# Patient Record
Sex: Male | Born: 1960 | Race: White | Hispanic: No | Marital: Married | Smoking: Former smoker
Health system: Southern US, Community
[De-identification: ages and names within clinical notes are randomized; demographics above are authoritative.]

## PROBLEM LIST (undated history)

## (undated) DIAGNOSIS — E785 Hyperlipidemia, unspecified: Secondary | ICD-10-CM

## (undated) DIAGNOSIS — Z9889 Other specified postprocedural states: Secondary | ICD-10-CM

## (undated) DIAGNOSIS — F32A Depression, unspecified: Secondary | ICD-10-CM

## (undated) DIAGNOSIS — I255 Ischemic cardiomyopathy: Secondary | ICD-10-CM

## (undated) DIAGNOSIS — Z720 Tobacco use: Secondary | ICD-10-CM

## (undated) DIAGNOSIS — R519 Headache, unspecified: Secondary | ICD-10-CM

## (undated) DIAGNOSIS — G8929 Other chronic pain: Secondary | ICD-10-CM

## (undated) DIAGNOSIS — R112 Nausea with vomiting, unspecified: Secondary | ICD-10-CM

## (undated) DIAGNOSIS — K219 Gastro-esophageal reflux disease without esophagitis: Secondary | ICD-10-CM

## (undated) DIAGNOSIS — C61 Malignant neoplasm of prostate: Secondary | ICD-10-CM

## (undated) DIAGNOSIS — R51 Headache: Secondary | ICD-10-CM

## (undated) DIAGNOSIS — F329 Major depressive disorder, single episode, unspecified: Secondary | ICD-10-CM

## (undated) DIAGNOSIS — I2510 Atherosclerotic heart disease of native coronary artery without angina pectoris: Secondary | ICD-10-CM

## (undated) DIAGNOSIS — G4730 Sleep apnea, unspecified: Secondary | ICD-10-CM

## (undated) DIAGNOSIS — I219 Acute myocardial infarction, unspecified: Secondary | ICD-10-CM

## (undated) HISTORY — DX: Headache, unspecified: R51.9

## (undated) HISTORY — DX: Malignant neoplasm of prostate: C61

## (undated) HISTORY — DX: Headache: R51

## (undated) HISTORY — DX: Major depressive disorder, single episode, unspecified: F32.9

## (undated) HISTORY — DX: Depression, unspecified: F32.A

## (undated) HISTORY — PX: VASECTOMY: SHX75

## (undated) HISTORY — DX: Hyperlipidemia, unspecified: E78.5

## (undated) HISTORY — DX: Sleep apnea, unspecified: G47.30

## (undated) HISTORY — PX: TONSILLECTOMY: SUR1361

## (undated) HISTORY — DX: Other chronic pain: G89.29

## (undated) HISTORY — DX: Acute myocardial infarction, unspecified: I21.9

---

## 2008-10-18 ENCOUNTER — Ambulatory Visit: Payer: Self-pay | Admitting: Internal Medicine

## 2008-10-18 DIAGNOSIS — F3289 Other specified depressive episodes: Secondary | ICD-10-CM | POA: Insufficient documentation

## 2008-10-18 DIAGNOSIS — E109 Type 1 diabetes mellitus without complications: Secondary | ICD-10-CM | POA: Insufficient documentation

## 2008-10-18 DIAGNOSIS — F329 Major depressive disorder, single episode, unspecified: Secondary | ICD-10-CM

## 2008-10-23 ENCOUNTER — Encounter (INDEPENDENT_AMBULATORY_CARE_PROVIDER_SITE_OTHER): Payer: Self-pay | Admitting: *Deleted

## 2008-10-25 LAB — CONVERTED CEMR LAB
AST: 13 units/L (ref 0–37)
BUN: 7 mg/dL (ref 6–23)
CO2: 21 meq/L (ref 19–32)
Glucose, Bld: 67 mg/dL — ABNORMAL LOW (ref 70–99)
Potassium: 3.6 meq/L (ref 3.5–5.3)

## 2008-11-01 ENCOUNTER — Telehealth (INDEPENDENT_AMBULATORY_CARE_PROVIDER_SITE_OTHER): Payer: Self-pay | Admitting: *Deleted

## 2008-11-05 ENCOUNTER — Telehealth (INDEPENDENT_AMBULATORY_CARE_PROVIDER_SITE_OTHER): Payer: Self-pay | Admitting: *Deleted

## 2008-11-08 ENCOUNTER — Encounter: Payer: Self-pay | Admitting: Internal Medicine

## 2008-11-15 ENCOUNTER — Encounter: Payer: Self-pay | Admitting: Internal Medicine

## 2008-11-15 LAB — HM DIABETES EYE EXAM

## 2009-01-24 ENCOUNTER — Ambulatory Visit: Payer: Self-pay | Admitting: Internal Medicine

## 2009-01-24 DIAGNOSIS — M25519 Pain in unspecified shoulder: Secondary | ICD-10-CM | POA: Insufficient documentation

## 2009-02-03 ENCOUNTER — Telehealth (INDEPENDENT_AMBULATORY_CARE_PROVIDER_SITE_OTHER): Payer: Self-pay | Admitting: *Deleted

## 2009-06-02 ENCOUNTER — Ambulatory Visit: Payer: Self-pay | Admitting: Family Medicine

## 2009-06-02 DIAGNOSIS — G44009 Cluster headache syndrome, unspecified, not intractable: Secondary | ICD-10-CM | POA: Insufficient documentation

## 2009-06-04 ENCOUNTER — Telehealth (INDEPENDENT_AMBULATORY_CARE_PROVIDER_SITE_OTHER): Payer: Self-pay | Admitting: *Deleted

## 2009-06-05 ENCOUNTER — Telehealth (INDEPENDENT_AMBULATORY_CARE_PROVIDER_SITE_OTHER): Payer: Self-pay | Admitting: *Deleted

## 2009-06-06 ENCOUNTER — Encounter: Payer: Self-pay | Admitting: Internal Medicine

## 2009-06-06 ENCOUNTER — Telehealth (INDEPENDENT_AMBULATORY_CARE_PROVIDER_SITE_OTHER): Payer: Self-pay | Admitting: *Deleted

## 2009-12-26 ENCOUNTER — Ambulatory Visit: Payer: Self-pay | Admitting: Family Medicine

## 2009-12-26 DIAGNOSIS — G4700 Insomnia, unspecified: Secondary | ICD-10-CM | POA: Insufficient documentation

## 2009-12-26 DIAGNOSIS — J019 Acute sinusitis, unspecified: Secondary | ICD-10-CM | POA: Insufficient documentation

## 2010-01-30 ENCOUNTER — Ambulatory Visit: Payer: Self-pay | Admitting: Internal Medicine

## 2010-02-10 ENCOUNTER — Telehealth: Payer: Self-pay | Admitting: Internal Medicine

## 2010-03-10 NOTE — Medication Information (Signed)
Summary: Approval for Sumatriptan Spray/[facility]  Approval for Sumatriptan Spray/[facility]   Imported By: Lanelle Bal 06/13/2009 11:59:52  _____________________________________________________________________  External Attachment:    Type:   Image     Comment:   External Document

## 2010-03-10 NOTE — Progress Notes (Signed)
  Phone Note Outgoing Call Call back at [facility] Phone [phone]   Summary of Call: imitrex tabs denied for cluster headaches.....Marland Kitchen  advised pt that we can try nasal spray, [facility]....prior auth initiated Shary Decamp  June 06, 2009 8:13 AM     New/Updated Medications: IMITREX 20 MG/ACT SOLN (SUMATRIPTAN) 1 spray at onset of headache may repeat q2 hours Prescriptions: IMITREX 20 MG/ACT SOLN (SUMATRIPTAN) 1 spray at onset of headache may repeat q2 hours  #1 x 1   Entered by:   Shary Decamp   Authorized by:   Neena Rhymes MD   Signed by:   Shary Decamp on 06/06/2009   Method used:   [facility] [phone]* (retail)       [address]       [place], [place]  [address]       Ph: [phone]       Fax: [phone]   RxID:   [id number]   Appended Document:  case id # [id number] for nasal spray has been approved for 05/16/09 until 05/17/10, pharmacy aware

## 2010-03-10 NOTE — Progress Notes (Signed)
Summary: prior auth--NOTE FROM PATIENT  Phone Note Call from Patient Call back at [facility] Phone [phone]   Summary of Call: prior auth required on sumatriptan -- called [facility] [phone], case ID # [id number] Nathaniel [name]  June 04, 2009 2:44 PM   Follow-up for Phone Call        PATIENT STOPPED BY TO PICK UP SAMPLES--LEFT PAPERWORK STATING THAT HIS CASE #[id number] WAS DENIED TODAY---PLEASE COPY LETTER---[facility] APPEALS CORRECTION---EXPLAIN SECTION A QUESTION---1) PT SUFFERS FROM [place]---***ANSWER MUST BE YES****  PLEASE FAX NEW [facility] LETTER BACK TO [phone]  WILL TAKE NOTE TO STACIA'S DESK Follow-up by: Jerolyn Shin,  June 05, 2009 4:57 PM  Additional Follow-up for Phone Call Additional follow up Details #1::        see phone note from 4/29, changed to nasal spray Nathaniel [name]  June 06, 2009 11:38 AM

## 2010-03-10 NOTE — Assessment & Plan Note (Signed)
Summary: cluster headache/cbs   Vital Signs:  Patient profile:   50 year old male Weight:      176 pounds Pulse rate:   68 / minute BP sitting:   130 / 80  (left arm)  Vitals Entered By: Doristine Devoid (June 02, 2009 1:50 PM) CC: Cluster HA x1 wk vicodin no help  Comments -has been on Imitrex in the past that help some    History of Present Illness: 50 yo man here today complaining of cluster headaches.  has been taking vicodin w/out relief.  sxs started 1 week ago.  first dx'd 3-4 years ago by previous MD.  only occurs at season changes.  pain is 'excrutiating, unbearable for a few hours', occuring at the same time every night, lasting 2 hrs.  has taken Imitrex in past w/ some relief.  also associated w/ severe nasal congestion.  pressure is always R frontal.  no focal weakness, N/V.  Allergies (verified): No Known Drug Allergies  Review of Systems      See HPI  Physical Exam  General:  alert, well-developed, and well-nourished.   Head:  Normocephalic and atraumatic without obvious abnormalities. No apparent alopecia or balding. Eyes:  PERRL, EOMI, fundi WNL Ears:  External ear exam shows no significant lesions or deformities.  Otoscopic examination reveals clear canals, tympanic membranes are intact bilaterally without bulging, retraction, inflammation or discharge. Hearing is grossly normal bilaterally. Nose:  minimal congestion Lungs:  normal respiratory effort, no intercostal retractions, no accessory muscle use, and normal breath sounds.   Heart:  normal rate, regular rhythm, and no murmur.   Neurologic:  No cranial nerve deficits noted. Station and gait are normal. Plantar reflexes are down-going bilaterally. DTRs are symmetrical throughout. Sensory, motor and coordinative functions appear intact.   Impression & Recommendations:  Problem # 1:  CLUSTER HEADACHE (ICD-339.00) Assessment New pt's sxs occur w/ season changes.  suggested pt discuss a preventative med like  verapamil w/ PCP when he is not in acute event.  offered pt referral to headache wellness center, pt not sure what they would do for him.  script given for imitrex.  no red flags on PE today.  Complete Medication List: 1)  Insulin Pump  2)  Humalog 100 Unit/ml Soln (Insulin lispro (human)) .... Uses in insulin pump 3)  Naprosyn 500 Mg Tabs (Naproxen) .... One twice a day with food p.r.n. pain 4)  Vicodin Es 7.5-750 Mg Tabs (Hydrocodone-acetaminophen) .... One by mouth every 6 hours as needed for pain 5)  Sumatriptan Succinate 50 Mg Tabs (Sumatriptan succinate) .Marland Kitchen.. 1 tab at onset of headache.  may repeat in 2 hours.  disp 1 month supply 6)  Onetouch Ultra Test Strp (Glucose blood) .... Use to test as directed.  disp 1 box  Patient Instructions: 1)  Follow up with Dr Drue Novel as directed 2)  Use the Sumatriptan at the first sign of headache, do not take more than 2 pills/24 hours 3)  Hang in there!! Prescriptions: ONETOUCH ULTRA TEST  STRP (GLUCOSE BLOOD) use to test as directed.  disp 1 box  #1 box x 1   Entered and Authorized by:   Neena Rhymes MD   Signed by:   Neena Rhymes MD on 06/02/2009   Method used:   [facility] to        [facility] [phone]* (retail)       [address]       [place]  [place], [place]  [address]       Ph: [phone]       Fax: [phone]   RxID:   [id number] SUMATRIPTAN SUCCINATE 50 MG TABS (SUMATRIPTAN SUCCINATE) 1 tab at onset of headache.  may repeat in 2 hours.  disp 1 month supply  #1 month x 3   Entered and Authorized by:   Neena Rhymes MD   Signed by:   Neena Rhymes MD on 06/02/2009   Method used:   [facility]ronically to        [facility] [phone]* (retail)       [address]       [place], [place]  [address]       Ph: [phone]       Fax: [phone]   RxID:   [id number]

## 2010-03-10 NOTE — Progress Notes (Signed)
Summary: try different med  Phone Note Call from Patient Call back at Home Phone [phone]   Caller: Patient Summary of Call: patient wanted to know if med was authorized - he said he still has headaches ---could he try treximet since we have samples until we get auth  Initial call taken by: Okey Regal Spring,  June 05, 2009 3:35 PM  Follow-up for Phone Call        I left message on patient's VM informing him per Dr.Tabori its ok for him to get more samples of Treximet while Generic Imitrex is in process for prior auth. We only had two samples which contain 1 pill a peice and I placed it at the front Follow-up by: Shonna Chock,  June 05, 2009 3:49 PM

## 2010-03-10 NOTE — Assessment & Plan Note (Signed)
Summary: SINUS PAIN,CONGESTION/RH.....   Vital Signs:  Patient profile:   50 year old male Height:      69 inches Weight:      182.4 pounds BMI:     27.03 O2 Sat:      99 % on Room air Temp:     98.1 degrees F oral Pulse rate:   92 / minute Pulse rhythm:   regular BP sitting:   112 / 76  (left arm) Cuff size:   large  Vitals Entered By: Almeta Monas CMA Duncan Dull) (December 26, 2009 3:23 PM)  O2 Flow:  Room air CC: x4days c/o cough, congestion, sinus pressure and pain, URI symptoms   History of Present Illness:       This is a 50 year old man who presents with URI symptoms.  The patient complains of nasal congestion, purulent nasal discharge, and earache, but denies clear nasal discharge, sore throat, dry cough, productive cough, and sick contacts.  The patient denies fever, low-grade fever (<100.5 degrees), fever of 100.5-103 degrees, fever of 103.1-104 degrees, fever to >104 degrees, stiff neck, dyspnea, wheezing, rash, vomiting, diarrhea, use of an antipyretic, and response to antipyretic.  The patient also reports headache.  The patient denies itchy watery eyes, itchy throat, sneezing, seasonal symptoms, response to antihistamine, muscle aches, and severe fatigue.  The patient denies the following risk factors for Strep sinusitis: unilateral facial pain, unilateral nasal discharge, poor response to decongestant, double sickening, tooth pain, Strep exposure, tender adenopathy, and absence of cough.    Current Medications (verified): 1)  Insulin Pump 2)  Humalog 100 Unit/ml Soln (Insulin Lispro (Human)) .... Uses in Insulin Pump 3)  Naprosyn 500 Mg Tabs (Naproxen) .... One Twice A Day With Food P.r.n. Pain 4)  Vicodin Es 7.5-750 Mg Tabs (Hydrocodone-Acetaminophen) .... One By Mouth Every 6 Hours As Needed For Pain 5)  Onetouch Ultra Test  Strp (Glucose Blood) .... Use To Test As Directed.  Disp 1 Box 6)  Imitrex 20 Mg/act Soln (Sumatriptan) .Marland Kitchen.. 1 Spray At Onset of Headache May Repeat  Q2 Hours 7)  Klonopin 0.5 Mg Tabs (Clonazepam) .Marland Kitchen.. 1 By Mouth At Bedtime As Needed 8)  Augmentin 875-125 Mg Tabs (Amoxicillin-Pot Clavulanate) .Marland Kitchen.. 1 By Mouth Two Times A Day 9)  Flonase 50 Mcg/act Susp (Fluticasone Propionate) .... 2 Sprays Each Nostril Once Daily  Allergies (verified): No Known Drug Allergies  Past History:  Past Medical History: Last updated: 11-15-08 Diabetes mellitus, Dx at age 50, not overweight. Was told he was type I Hyperlipidemia sleep apnea (has cpap does not use) Depression, has taken meds for depression before   Past Surgical History: Last updated: November 15, 2008 Tonsillectomy Vasectomy  Family History: Last updated: Nov 15, 2008 M - living -- MS F - deceased parkinson CAD - no HTN - no DM - no stroke - F colon Ca - no prostate ca - no  Social History: Last updated: 15-Nov-2008 Moved form [place] on July 2010 Occupation: work for [employer] g[employer] Divorced 1 daughter (age 92) Current Smoker (2 cigarrets daily) Alcohol use-yes - weekends only Drug use-no Regular exercise-no caffeine use - 2 cups coffee every morning  Risk Factors: Exercise: no (11-15-2008)  Risk Factors: Smoking Status: current (2008/11/15)  Family History: Reviewed history from 11/15/08 and no changes required. M - living -- MS F - deceased parkinson CAD - no HTN - no DM - no stroke - F colon Ca - no prostate ca - no  Social History: Reviewed history from Nov 15, 2008  and no changes required. Moved form [place] on July 2010 Occupation: work for [employer] Divorced 1 daughter (age 42) Current Smoker (2 cigarrets daily) Alcohol use-yes - weekends only Drug use-no Regular exercise-no caffeine use - 2 cups coffee every morning  Review of Systems      See HPI  Physical Exam  General:  Well-developed,well-nourished,in no acute distress; alert,appropriate and cooperative throughout examination Ears:  External ear exam shows no significant lesions  or deformities.  Otoscopic examination reveals clear canals, tympanic membranes are intact bilaterally without bulging, retraction, inflammation or discharge. Hearing is grossly normal bilaterally. Nose:  no external deformity, L frontal sinus tenderness, L maxillary sinus tenderness, R frontal sinus tenderness, and R maxillary sinus tenderness.   Mouth:  pharyngeal erythema and postnasal drip.   Neck:  supple and cervical lymphadenopathy.   Lungs:  Normal respiratory effort, chest expands symmetrically. Lungs are clear to auscultation, no crackles or wheezes. Heart:  normal rate and no murmur.   Psych:  Oriented X3 and normally interactive.     Impression & Recommendations:  Problem # 1:  SINUSITIS - ACUTE-NOS (ICD-461.9)  His updated medication list for this problem includes:    Augmentin 875-125 Mg Tabs (Amoxicillin-pot clavulanate) .Marland Kitchen... 1 by mouth two times a day    Flonase 50 Mcg/act Susp (Fluticasone propionate) .Marland Kitchen... 2 sprays each nostril once daily  Instructed on treatment. Call if symptoms persist or worsen.   Orders: Glucose, (CBG) (09811)  Problem # 2:  INSOMNIA (ICD-780.52)  pt had klonopin at home ---rx from Dr in [place].  He never had it filled here.    Orders: Glucose, (CBG) (91478)  Problem # 3:  DIABETES MELLITUS, TYPE I (ICD-250.01)  Pt felt lightheaded in office and asked for CMA to check BS---- BS 44--- pt drank some oj and felt better His updated medication list for this problem includes:    Humalog 100 Unit/ml Soln (Insulin lispro (human)) ..... Uses in insulin pump  Orders: Glucose, (CBG) (29562)  Complete Medication List: 1)  Insulin Pump  2)  Humalog 100 Unit/ml Soln (Insulin lispro (human)) .... Uses in insulin pump 3)  Naprosyn 500 Mg Tabs (Naproxen) .... One twice a day with food p.r.n. pain 4)  Vicodin Es 7.5-750 Mg Tabs (Hydrocodone-acetaminophen) .... One by mouth every 6 hours as needed for pain 5)  Onetouch Ultra Test Strp (Glucose blood)  .... Use to test as directed.  disp 1 box 6)  Imitrex 20 Mg/act Soln (Sumatriptan) .Marland Kitchen.. 1 spray at onset of headache may repeat q2 hours 7)  Klonopin 0.5 Mg Tabs (Clonazepam) .Marland Kitchen.. 1 by mouth at bedtime as needed 8)  Augmentin 875-125 Mg Tabs (Amoxicillin-pot clavulanate) .Marland Kitchen.. 1 by mouth two times a day 9)  Flonase 50 Mcg/act Susp (Fluticasone propionate) .... 2 sprays each nostril once daily Prescriptions: FLONASE 50 MCG/ACT SUSP (FLUTICASONE PROPIONATE) 2 sprays each nostril once daily  #1 x 2   Entered and Authorized by:   Loreen Freud DO   Signed by:   Loreen Freud DO on 12/26/2009   Method used:   [facility] to        [facility] [phone]* (retail)       [address]       [place], [place]  [address]       Ph: [phone]       Fax: [phone]   RxID:   [id number] AUGMENTIN 875-125 MG TABS (AMOXICILLIN-POT CLAVULANATE) 1  by mouth two times a day  #20 x 0   Entered and Authorized by:   Loreen Freud DO   Signed by:   Loreen Freud DO on 12/26/2009   Method used:   [facility]ronically to        [facility] [phone]* (retail)       [address]       [place], [place]  [address]       Ph: [phone]       Fax: [phone]   RxID:   [id number] KLONOPIN 0.5 MG TABS (CLONAZEPAM) 1 by mouth at bedtime as needed  #30 x 0   Entered and Authorized by:   Loreen Freud DO   Signed by:   Loreen Freud DO on 12/26/2009   Method used:   Print then Give to Patient   RxID:   [id number]    Orders Added: 1)  Est. Patient Level III [34742] 2)  Glucose, (CBG) [82962]    Laboratory Results   Blood Tests     Glucose (random): 44 mg/dL   (Normal Range: 59-563)

## 2010-03-12 NOTE — Assessment & Plan Note (Signed)
Summary: Sinus/kn   Vital Signs:  Patient profile:   50 year old male Weight:      181.38 pounds O2 Sat:      97 % on Room air Temp:     98.2 degrees F oral Pulse rate:   74 / minute Pulse rhythm:   regular BP sitting:   126 / 84  (left arm) Cuff size:   large  Vitals Entered By: Army Fossa CMA (January 28, 2010 4:09 PM)  O2 Flow:  Room air CC: Pt here c/o chest congestion x 1 month Comments tried robitussion [facility] [address] not fasting    History of Present Illness: was seen 12-26-09 with acute sinusitis Treated with Augmentin Symptoms initially improved  but  for the last week  is having respiratory sx again ---->  his actual CC today is persistent dry cough Some throat irritation Symptoms worse at night  Review of systems still has postnasal dripping, he is taking nasal steroids Denies any fevers No actual sinus pain or chest congestion No sputum production per se Nasal discharge essentially clear No itchy eyes or itchy nose  Current Medications (verified): 1)  Insulin Pump 2)  Humalog 100 Unit/ml Soln (Insulin Lispro (Human)) .... Uses in Insulin Pump 3)  Onetouch Ultra Test  Strp (Glucose Blood) .... Use To Test As Directed.  Disp 1 Box 4)  Imitrex 20 Mg/act Soln (Sumatriptan) .Marland Kitchen.. 1 Spray At Onset of Headache May Repeat Q2 Hours 5)  Klonopin 0.5 Mg Tabs (Clonazepam) .Marland Kitchen.. 1 By Mouth At Bedtime As Needed 6)  Flonase 50 Mcg/act Susp (Fluticasone Propionate) .... 2 Sprays Each Nostril Once Daily  Allergies (verified): No Known Drug Allergies  Past History:  Past Medical History: Reviewed history from 10/18/2008 and no changes required. Diabetes mellitus, Dx at age 62, not overweight. Was told he was type I Hyperlipidemia sleep apnea (has cpap does not use) Depression, has taken meds for depression before   Past Surgical History: Reviewed history from 10/18/2008 and no changes required. Tonsillectomy Vasectomy  Social History: Reviewed history  from 10/18/2008 and no changes required. Moved form [place] on July 2010 Occupation: work for [employer] Divorced 1 daughter (age 7) Current Smoker (2 cigarrets daily) Alcohol use-yes - weekends only Drug use-no Regular exercise-no caffeine use - 2 cups coffee every morning  Physical Exam  General:  alert, well-developed, and well-nourished.   Head:  face symmetric, nontender to palpation of the maxillary sinuses Ears:  R ear normal and L ear normal.   Nose:  congested  Mouth:  no redness or discharge Lungs:  normal respiratory effort, no intercostal retractions, no accessory muscle use, and normal breath sounds.     Impression & Recommendations:  Problem # 1:  SINUSITIS - ACUTE-NOS (ICD-461.9) persistent  respiratory symptoms--cough, PN drip Patient is diabetic Plan: Avelox Cough suppression with hydrocodone If no better, consider move promptly to a CT and/ or a ENT evaluation see instructions  His updated medication list for this problem includes:    Flonase 50 Mcg/act Susp (Fluticasone propionate) .Marland Kitchen... 2 sprays each nostril once daily    Avelox 400 Mg Tabs (Moxifloxacin hcl) ..... One by mouth daily    Hydrocodone-homatropine 5-1.5 Mg/4ml Syrp (Hydrocodone-homatropine) ..... One or 2 teaspoons every 8 hours as needed for cough. will cause drowsiness    Astepro 0.15 % Soln (Azelastine hcl) .Marland Kitchen... 2 sprays on each side of the nose twice a day for one month  Complete Medication List: 1)  Insulin Pump  2)  Humalog 100 Unit/ml Soln (Insulin lispro (human)) .... Uses in insulin pump 3)  Onetouch Ultra Test Strp (Glucose blood) .... Use to test as directed.  disp 1 box 4)  Imitrex 20 Mg/act Soln (Sumatriptan) .Marland Kitchen.. 1 spray at onset of headache may repeat q2 hours 5)  Klonopin 0.5 Mg Tabs (Clonazepam) .Marland Kitchen.. 1 by mouth at bedtime as needed 6)  Flonase 50 Mcg/act Susp (Fluticasone propionate) .... 2 sprays each nostril once daily 7)  Avelox 400 Mg Tabs (Moxifloxacin hcl)  .... One by mouth daily 8)  Hydrocodone-homatropine 5-1.5 Mg/44ml Syrp (Hydrocodone-homatropine) .... One or 2 teaspoons every 8 hours as needed for cough. will cause drowsiness 9)  Astepro 0.15 % Soln (Azelastine hcl) .... 2 sprays on each side of the nose twice a day for one month  Patient Instructions: 1)  Avelox for 10 days 2)  Flonase as before 3)  Also astepro twice a day 4)  for cough---use Mucinex DM twice a day as needed. 5)  If the cough is severe, use hydrocodone as prescribed, will cause drowsiness 6)  Call if you are not better in one week 7)  Call anytime if   worse Prescriptions: ASTEPRO 0.15 % SOLN (AZELASTINE HCL) 2 sprays on each side of the nose twice a day for one month  #1 x 0   Entered and Authorized by:   Elita Quick E. Paz MD   Signed by:   Nolon Rod. Paz MD on 01/28/2010   Method used:   Print then Give to Patient   RxID:   [id number] HYDROCODONE-HOMATROPINE 5-1.5 MG/5ML SYRP (HYDROCODONE-HOMATROPINE) one or 2 teaspoons every 8 hours as needed for cough. Will cause drowsiness  #180cc x 0   Entered and Authorized by:   Nolon Rod. Paz MD   Signed by:   Nolon Rod. Paz MD on 01/28/2010   Method used:   Print then Give to Patient   RxID:   [id number] AVELOX 400 MG TABS (MOXIFLOXACIN HCL) one by mouth daily  #10 x 0   Entered and Authorized by:   Nolon Rod. Paz MD   Signed by:   Nolon Rod. Paz MD on 01/28/2010   Method used:   Print then Give to Patient   RxID:   [id number]    Orders Added: 1)  Est. Patient Level III [01027]

## 2010-03-12 NOTE — Progress Notes (Signed)
Summary: pt status   ---- Converted from flag ---- ---- 01/28/2010 6:12 PM, Toy Samarin E. Alisia Vanengen MD wrote: check on him, better? ------------------------------  I spoke w/ pt and he states that his better!

## 2010-05-12 ENCOUNTER — Other Ambulatory Visit: Payer: Self-pay | Admitting: Internal Medicine

## 2010-05-12 NOTE — Telephone Encounter (Signed)
Needs to call his endocrinologist

## 2010-05-14 ENCOUNTER — Telehealth: Payer: Self-pay | Admitting: Internal Medicine

## 2010-05-14 NOTE — Telephone Encounter (Signed)
Test strips for one touch ultra - [facility] 90 day supply  - [facility] told patient it was denied

## 2010-05-18 MED ORDER — GLUCOSE BLOOD VI STRP: ORAL_STRIP | Status: DC

## 2010-05-25 ENCOUNTER — Ambulatory Visit (INDEPENDENT_AMBULATORY_CARE_PROVIDER_SITE_OTHER): Payer: PRIVATE HEALTH INSURANCE | Admitting: Internal Medicine

## 2010-05-25 ENCOUNTER — Encounter: Payer: Self-pay | Admitting: Internal Medicine

## 2010-05-25 DIAGNOSIS — H9209 Otalgia, unspecified ear: Secondary | ICD-10-CM | POA: Insufficient documentation

## 2010-05-25 DIAGNOSIS — E109 Type 1 diabetes mellitus without complications: Secondary | ICD-10-CM

## 2010-05-25 MED ORDER — INSULIN LISPRO 100 UNIT/ML ~~LOC~~ SOLN: SUBCUTANEOUS | Status: DC

## 2010-05-25 NOTE — Assessment & Plan Note (Signed)
Long history of type 1 diabetes on insulin pump. His previous  endocrinologists (Dr. Katrinka Blazing) left the area about a year ago and he has not been seen. I will go ahead and RFa 3 month supply of insulin but he needs to be seen by endocrinology. A referral will be arranged. Patient will call if does not hear from the referral in one week.

## 2010-05-25 NOTE — Assessment & Plan Note (Signed)
Ill-defined feeling and a left ear resolved. Observation.

## 2010-05-25 NOTE — Progress Notes (Signed)
  Subjective:    Patient ID: Nathaniel [name], male    DOB: 1960-08-30, 50 y.o.   MRN: [id number]  HPI Developed a funny feeling in the left ear last week," like a fluid sound". Symptoms resolved but he would still like to get checked. He has type 1 diabetes,on a pump, needs a refill on his insulin.  Past Medical History  Diagnosis Date  . Diabetes mellitus     dx at age 32, not overweight was told he was type 1  . Hyperlipemia   . Sleep apnea     has CPAP does not use  . Depression     has taken meds for depression before   Past Surgical History  Procedure Date  . Tonsillectomy   . Vasectomy      Review of Systems No recent fever or URI symptoms No sore throat Average  blood sugar in the 140s.    Objective:   Physical Exam Alert oriented in on apparent distress Face is symmetric and nontender to palpation Ears: Bilateral tympanic membranes and canal normal. Percussion on either mastoid area is negative Oropharynx is free of discharge or redness.        Assessment & Plan:  We are referring the patient to endocrinology however he also needs a complete physical. I recommend him to come back in 3 months.

## 2010-07-16 ENCOUNTER — Other Ambulatory Visit: Payer: Self-pay | Admitting: Internal Medicine

## 2010-08-25 ENCOUNTER — Encounter: Payer: Self-pay | Admitting: Internal Medicine

## 2010-08-25 ENCOUNTER — Ambulatory Visit (INDEPENDENT_AMBULATORY_CARE_PROVIDER_SITE_OTHER): Payer: PRIVATE HEALTH INSURANCE | Admitting: Internal Medicine

## 2010-08-25 DIAGNOSIS — Z23 Encounter for immunization: Secondary | ICD-10-CM

## 2010-08-25 DIAGNOSIS — Z0000 Encounter for general adult medical examination without abnormal findings: Secondary | ICD-10-CM

## 2010-08-25 DIAGNOSIS — E109 Type 1 diabetes mellitus without complications: Secondary | ICD-10-CM

## 2010-08-25 LAB — CBC WITH DIFFERENTIAL/PLATELET
Eosinophils Absolute: 0.2 10*3/uL (ref 0.0–0.7)
Eosinophils Relative: 4.4 % (ref 0.0–5.0)
HCT: 42.7 % (ref 39.0–52.0)
Lymphs Abs: 1.4 10*3/uL (ref 0.7–4.0)
MCHC: 34.1 g/dL (ref 30.0–36.0)
MCV: 98.5 fl (ref 78.0–100.0)
Monocytes Absolute: 0.5 10*3/uL (ref 0.1–1.0)
Platelets: 216 10*3/uL (ref 150.0–400.0)
WBC: 4.7 10*3/uL (ref 4.5–10.5)

## 2010-08-25 LAB — COMPREHENSIVE METABOLIC PANEL
ALT: 20 U/L (ref 0–53)
AST: 28 U/L (ref 0–37)
Albumin: 4.7 g/dL (ref 3.5–5.2)
Alkaline Phosphatase: 70 U/L (ref 39–117)
Glucose, Bld: 113 mg/dL — ABNORMAL HIGH (ref 70–99)
Potassium: 4.5 mEq/L (ref 3.5–5.1)
Sodium: 138 mEq/L (ref 135–145)
Total Bilirubin: 0.9 mg/dL (ref 0.3–1.2)
Total Protein: 7.8 g/dL (ref 6.0–8.3)

## 2010-08-25 LAB — MICROALBUMIN / CREATININE URINE RATIO
Creatinine,U: 65.1 mg/dL
Microalb Creat Ratio: 0.3 mg/g (ref 0.0–30.0)
Microalb, Ur: 0.2 mg/dL (ref 0.0–1.9)

## 2010-08-25 LAB — LIPID PANEL
Cholesterol: 222 mg/dL — ABNORMAL HIGH (ref 0–200)
Total CHOL/HDL Ratio: 3

## 2010-08-25 LAB — LDL CHOLESTEROL, DIRECT: Direct LDL: 140 mg/dL

## 2010-08-25 LAB — PSA: PSA: 4.64 ng/mL — ABNORMAL HIGH (ref 0.10–4.00)

## 2010-08-25 LAB — TSH: TSH: 1.48 u[IU]/mL (ref 0.35–5.50)

## 2010-08-25 LAB — HEMOGLOBIN A1C: Hgb A1c MFr Bld: 7.5 % — ABNORMAL HIGH (ref 4.6–6.5)

## 2010-08-25 NOTE — Assessment & Plan Note (Addendum)
Td todaf zostavax ? Recommended for 50 y/o and older but he has DM, rec to check w/ his insurance about cost CCS:  Cscope v. iFOB doiscussed--------------elected Cscope , referral done Diet-- to see a nutrtitionist soon Exercise -- plans to start soon EKG WNL Labs

## 2010-08-25 NOTE — Progress Notes (Signed)
  Subjective:    Patient ID: Nathaniel [name], male    DOB: 06-17-1960, 50 y.o.   MRN: [id number]  HPI CPX   Past Medical History  Diagnosis Date  . Diabetes mellitus     dx at age 79, not overweight was told he was type 1  . Hyperlipemia   . Sleep apnea     has CPAP does not use  . Depression     has taken meds for depression before   Past Surgical History  Procedure Date  . Tonsillectomy   . Vasectomy    Family History  Problem Relation Age of Onset  . Multiple sclerosis Mother   . Parkinsonism Father   . Coronary artery disease Neg Hx   . Hypertension Neg Hx   . Diabetes Neg Hx   . Stroke Father   . Colon cancer Neg Hx   . Prostate cancer Neg Hx    History   Social History  . Marital Status: Divorced    Spouse Name: N/A    Number of Children: 1  . Years of Education: N/A   Occupational History  . [employer]    Social History Main Topics  . Smoking status: Current Everyday Smoker -- 0.3 packs/day  . Smokeless tobacco: Not on file  . Alcohol Use: Yes     weekends only  . Drug Use: No  . Sexually Active: Not on file   Other Topics Concern  . Not on file   Social History Narrative   Moved from [place] on July 2010---Regular exercise:no--- diet: to see a diabetes educator      Review of Systems  Constitutional: Negative for fever and unexpected weight change. Fatigue: mild fatigue.  Respiratory: Negative for cough and wheezing.        Rested in the morning? "sometimes" Fall sleep easy? no  Cardiovascular: Negative for chest pain, palpitations and leg swelling.  Gastrointestinal: Negative for abdominal pain and blood in stool.  Genitourinary: Negative for dysuria, hematuria and difficulty urinating.  Psychiatric/Behavioral:       No anxiety, depression       Objective:   Physical Exam  Constitutional: He is oriented to person, place, and time. He appears well-developed and well-nourished. No distress.  HENT:  Head: Normocephalic and atraumatic.   Neck: No thyromegaly present.       Nl carotid pulse   Cardiovascular: Normal rate, regular rhythm and normal heart sounds.   No murmur heard. Pulmonary/Chest: Effort normal and breath sounds normal. No respiratory distress. He has no wheezes. He has no rales.  Abdominal: Soft. Bowel sounds are normal. He exhibits no distension. There is no tenderness. There is no rebound and no guarding.  Genitourinary: Rectum normal and prostate normal. Guaiac negative stool.  Musculoskeletal: He exhibits no edema.  Neurological: He is alert and oriented to person, place, and time.  Skin: Skin is warm and dry. He is not diaphoretic.  Psychiatric: He has a normal mood and affect. His behavior is normal. Judgment and thought content normal.          Assessment & Plan:

## 2010-08-25 NOTE — Assessment & Plan Note (Signed)
Per Dr Sharl Ma, since we are doing labs, will check a A1C and fax to endocrinology

## 2010-08-25 NOTE — Patient Instructions (Addendum)
See about zostavax (shingles shot)

## 2010-08-27 ENCOUNTER — Telehealth: Payer: Self-pay | Admitting: *Deleted

## 2010-08-27 ENCOUNTER — Encounter: Payer: Self-pay | Admitting: Gastroenterology

## 2010-08-27 NOTE — Telephone Encounter (Signed)
Message left for patient to return my call.  Faxed to Dr.Kerr.

## 2010-08-27 NOTE — Telephone Encounter (Signed)
Message copied by Leanne Lovely on Thu Aug 27, 2010  1:36 PM ------      Message from: [name] E      Created: Thu Aug 27, 2010 12:47 PM       Advise patient:      His cholesterol needs improvement, LDL is 140, needs to be less than 100----> start Lipitor 20 mg one by mouth each bedtime      PSA is slightly elevated, I need to recheck him in 6 months      All other labs ok      A1C per Dr Sharl Ma , send all labs to him      PLEASE ARRANGE:      FLP AST ALT 3 months      OV w/ me in 6 months

## 2010-08-28 NOTE — Telephone Encounter (Signed)
Message left for patient to return my call.  

## 2010-08-31 MED ORDER — ATORVASTATIN CALCIUM 20 MG PO TABS: 20.0000 mg | ORAL_TABLET | Freq: Every day | ORAL | Status: DC

## 2010-08-31 NOTE — Telephone Encounter (Signed)
Pt is aware.  

## 2010-09-09 ENCOUNTER — Telehealth: Payer: Self-pay

## 2010-09-09 ENCOUNTER — Ambulatory Visit (AMBULATORY_SURGERY_CENTER): Payer: PRIVATE HEALTH INSURANCE | Admitting: *Deleted

## 2010-09-09 VITALS — Ht 69.0 in | Wt 178.0 lb

## 2010-09-09 DIAGNOSIS — Z1211 Encounter for screening for malignant neoplasm of colon: Secondary | ICD-10-CM

## 2010-09-09 MED ORDER — PEG-KCL-NACL-NASULF-NA ASC-C 100 G PO SOLR: ORAL | Status: DC

## 2010-09-09 NOTE — Telephone Encounter (Signed)
Letter sent to Dr Drue Novel

## 2010-09-09 NOTE — Progress Notes (Signed)
Pt. Has insulin pump and needs instructions prior to colon regarding this.  Message sent to [facility]. Wyona Almas

## 2010-09-09 NOTE — Telephone Encounter (Signed)
Message copied by Donata Duff on Wed Sep 09, 2010  1:25 PM ------      Message from: Sarina Ill ANN      Created: Wed Sep 09, 2010  9:06 AM      Regarding: Insulin Pump       Pt. Needs instuctions regarding his insulin pump.  He will be having a colon 09/23/10 at 10:30am with Dr. Christella Hartigan.  Wyona Almas

## 2010-09-21 ENCOUNTER — Telehealth: Payer: Self-pay

## 2010-09-21 NOTE — Telephone Encounter (Signed)
Letter sent to Dr Sharl Ma

## 2010-09-21 NOTE — Telephone Encounter (Signed)
Letter will be sent to Dr Ester Rink

## 2010-09-21 NOTE — Telephone Encounter (Signed)
Left message on machine to call back  

## 2010-09-22 NOTE — Telephone Encounter (Signed)
Called and left a message with Dr Sharl Ma and the pt. Regarding insulin pump instructions

## 2010-09-23 ENCOUNTER — Encounter: Payer: Self-pay | Admitting: Gastroenterology

## 2010-09-23 ENCOUNTER — Ambulatory Visit (AMBULATORY_SURGERY_CENTER): Payer: PRIVATE HEALTH INSURANCE | Admitting: Gastroenterology

## 2010-09-23 VITALS — BP 127/71 | HR 78 | Temp 97.5°F | Resp 18 | Ht 69.0 in | Wt 178.0 lb

## 2010-09-23 DIAGNOSIS — D126 Benign neoplasm of colon, unspecified: Secondary | ICD-10-CM

## 2010-09-23 DIAGNOSIS — Z1211 Encounter for screening for malignant neoplasm of colon: Secondary | ICD-10-CM

## 2010-09-23 LAB — GLUCOSE, CAPILLARY: Glucose-Capillary: 129 mg/dL — ABNORMAL HIGH (ref 70–99)

## 2010-09-23 MED ORDER — SODIUM CHLORIDE 0.9 % IV SOLN: 500.0000 mL | INTRAVENOUS | Status: DC

## 2010-09-23 NOTE — Progress Notes (Signed)
Insulin pump preprogramed doses 12 am to 330 am .775 330 am to 2 pm .8 2 pm to 6 pm 0.5 6pm to 12 am 0.85

## 2010-09-23 NOTE — Telephone Encounter (Signed)
Pt arrived for procedure I spoke with Drinda Butts and made her aware that I never got a response from the pt or the PCP regarding his insulin pump.

## 2010-09-23 NOTE — Patient Instructions (Addendum)
One of your biggest health concerns is your smoking.  This increases your risk for most cancers and serious cardiovascular diseases such as strokes, heart attacks.  You should try your best to stop.  If you need assistence, please contact your PCP or Smoking Cessation Class at [facility] [phone]) or [facility] Quit-Line (1-800-QUIT-NOW). DISCHARGE INSTRUCTIONS GIVEN WITH VERBAL UNDERSTANDING. HANDOUT ON POLYP GIVEN. RESUME PREVIOUS MEDICATIONS.

## 2010-09-24 ENCOUNTER — Telehealth: Payer: Self-pay | Admitting: *Deleted

## 2010-09-24 NOTE — Telephone Encounter (Signed)

## 2010-11-30 ENCOUNTER — Other Ambulatory Visit: Payer: Self-pay

## 2010-11-30 MED ORDER — ATORVASTATIN CALCIUM 20 MG PO TABS: 20.0000 mg | ORAL_TABLET | Freq: Every day | ORAL | Status: DC

## 2010-12-03 ENCOUNTER — Other Ambulatory Visit: Payer: Self-pay | Admitting: *Deleted

## 2010-12-03 ENCOUNTER — Telehealth: Payer: Self-pay | Admitting: *Deleted

## 2010-12-03 MED ORDER — ATORVASTATIN CALCIUM 20 MG PO TABS: 20.0000 mg | ORAL_TABLET | Freq: Every day | ORAL | Status: DC

## 2010-12-03 NOTE — Telephone Encounter (Signed)
Patient called stating that there was a problem getting his Atorvastatin through [facility] [states he can normally transfer from [facility] online but there is some type of problem now]. We have seen issues since the '[facility]' merge with our patients. Patient upset that he had spent "his whole day on the phone with [facility] offices concerning this matter [cursing on telephone out of frustration].  Patient informed that we would leave Samples of medication at front desk available for pick up between 8am - 5pm Monday through Friday and that we would send medication directly to [facility]. Done.

## 2011-01-08 ENCOUNTER — Telehealth: Payer: Self-pay | Admitting: Internal Medicine

## 2011-01-08 NOTE — Telephone Encounter (Signed)
Patient gets cluster headache with change of season - he wants rx for sumatriptan  nasal spray - [facility] [address]

## 2011-01-11 MED ORDER — SUMATRIPTAN 20 MG/ACT NA SOLN: NASAL | Status: DC

## 2011-01-11 NOTE — Telephone Encounter (Signed)
Ok to call 3 month supply and 1 RF. Please edit sig.: 1 nasal spray  every 2 hours as needed for headaches, no more than 2 sprays a day

## 2011-01-11 NOTE — Telephone Encounter (Signed)
Done

## 2011-01-13 ENCOUNTER — Telehealth: Payer: Self-pay | Admitting: *Deleted

## 2011-01-13 NOTE — Telephone Encounter (Signed)
Prior Auth approved until 01-13-12, pharmacy notified via fax, approval letter scan to chart.

## 2011-03-14 ENCOUNTER — Telehealth: Payer: Self-pay | Admitting: Internal Medicine

## 2011-03-14 NOTE — Telephone Encounter (Signed)
Please arrange for office visit, fasting , we need to talk about his PSA and cholesterol

## 2011-03-15 ENCOUNTER — Encounter: Payer: Self-pay | Admitting: *Deleted

## 2011-03-15 NOTE — Telephone Encounter (Signed)
Left message to call office with detail message OV due call to schedule. Letter Mail

## 2011-04-17 ENCOUNTER — Encounter (HOSPITAL_COMMUNITY): Payer: Self-pay | Admitting: Emergency Medicine

## 2011-04-17 ENCOUNTER — Encounter (HOSPITAL_COMMUNITY)
Admission: EM | Disposition: A | Discharge status: Home / Self Care | Payer: Self-pay | Source: Home / Self Care | Attending: Cardiovascular Disease

## 2011-04-17 ENCOUNTER — Inpatient Hospital Stay (HOSPITAL_COMMUNITY)
Admission: EM | Admit: 2011-04-17 | Discharge: 2011-04-19 | DRG: 247 | Disposition: A | Discharge status: Home / Self Care | Payer: PRIVATE HEALTH INSURANCE | Attending: Cardiovascular Disease | Admitting: Cardiovascular Disease

## 2011-04-17 DIAGNOSIS — I2510 Atherosclerotic heart disease of native coronary artery without angina pectoris: Secondary | ICD-10-CM | POA: Diagnosis present

## 2011-04-17 DIAGNOSIS — I2109 ST elevation (STEMI) myocardial infarction involving other coronary artery of anterior wall: Principal | ICD-10-CM | POA: Diagnosis present

## 2011-04-17 DIAGNOSIS — F329 Major depressive disorder, single episode, unspecified: Secondary | ICD-10-CM | POA: Diagnosis present

## 2011-04-17 DIAGNOSIS — F3289 Other specified depressive episodes: Secondary | ICD-10-CM | POA: Diagnosis present

## 2011-04-17 DIAGNOSIS — E119 Type 2 diabetes mellitus without complications: Secondary | ICD-10-CM | POA: Diagnosis present

## 2011-04-17 DIAGNOSIS — G4733 Obstructive sleep apnea (adult) (pediatric): Secondary | ICD-10-CM | POA: Diagnosis present

## 2011-04-17 DIAGNOSIS — I2102 ST elevation (STEMI) myocardial infarction involving left anterior descending coronary artery: Secondary | ICD-10-CM

## 2011-04-17 DIAGNOSIS — E785 Hyperlipidemia, unspecified: Secondary | ICD-10-CM | POA: Diagnosis present

## 2011-04-17 DIAGNOSIS — I255 Ischemic cardiomyopathy: Secondary | ICD-10-CM

## 2011-04-17 DIAGNOSIS — E109 Type 1 diabetes mellitus without complications: Secondary | ICD-10-CM | POA: Insufficient documentation

## 2011-04-17 DIAGNOSIS — F17200 Nicotine dependence, unspecified, uncomplicated: Secondary | ICD-10-CM | POA: Diagnosis present

## 2011-04-17 DIAGNOSIS — Z720 Tobacco use: Secondary | ICD-10-CM

## 2011-04-17 HISTORY — DX: Tobacco use: Z72.0

## 2011-04-17 HISTORY — DX: Ischemic cardiomyopathy: I25.5

## 2011-04-17 HISTORY — DX: Atherosclerotic heart disease of native coronary artery without angina pectoris: I25.10

## 2011-04-17 HISTORY — PX: LEFT HEART CATHETERIZATION WITH CORONARY ANGIOGRAM: SHX5451

## 2011-04-17 HISTORY — PX: PERCUTANEOUS CORONARY STENT INTERVENTION (PCI-S): SHX5485

## 2011-04-17 HISTORY — PX: OTHER SURGICAL HISTORY: SHX169

## 2011-04-17 LAB — TSH: TSH: 1.257 u[IU]/mL (ref 0.350–4.500)

## 2011-04-17 LAB — COMPREHENSIVE METABOLIC PANEL
AST: 93 U/L — ABNORMAL HIGH (ref 0–37)
Albumin: 4.4 g/dL (ref 3.5–5.2)
BUN: 15 mg/dL (ref 6–23)
Calcium: 10 mg/dL (ref 8.4–10.5)
Chloride: 99 mEq/L (ref 96–112)
Creatinine, Ser: 0.88 mg/dL (ref 0.50–1.35)
Total Protein: 7.4 g/dL (ref 6.0–8.3)

## 2011-04-17 LAB — CARDIAC PANEL(CRET KIN+CKTOT+MB+TROPI)
CK, MB: 79.2 ng/mL (ref 0.3–4.0)
Relative Index: 12.8 — ABNORMAL HIGH (ref 0.0–2.5)
Relative Index: 12.9 — ABNORMAL HIGH (ref 0.0–2.5)
Relative Index: 9.1 — ABNORMAL HIGH (ref 0.0–2.5)
Total CK: 593 U/L — ABNORMAL HIGH (ref 7–232)
Troponin I: >25 ng/mL (ref <0.30)
Troponin I: >25 ng/mL (ref <0.30)

## 2011-04-17 LAB — GLUCOSE, CAPILLARY
Glucose-Capillary: 243 mg/dL — ABNORMAL HIGH (ref 70–99)
Glucose-Capillary: 249 mg/dL — ABNORMAL HIGH (ref 70–99)
Glucose-Capillary: 48 mg/dL — ABNORMAL LOW (ref 70–99)
Glucose-Capillary: 103 mg/dL — ABNORMAL HIGH (ref 70–99)

## 2011-04-17 LAB — CBC
HCT: 40.2 % (ref 39.0–52.0)
MCHC: 34.8 g/dL (ref 30.0–36.0)
MCV: 94.4 fL (ref 78.0–100.0)
Platelets: 221 10*3/uL (ref 150–400)
RDW: 12.7 % (ref 11.5–15.5)
WBC: 9.9 10*3/uL (ref 4.0–10.5)

## 2011-04-17 LAB — MAGNESIUM: Magnesium: 1.8 mg/dL (ref 1.5–2.5)

## 2011-04-17 LAB — DIFFERENTIAL
Basophils Absolute: 0 10*3/uL (ref 0.0–0.1)
Basophils Relative: 0 % (ref 0–1)
Eosinophils Relative: 1 % (ref 0–5)
Lymphocytes Relative: 5 % — ABNORMAL LOW (ref 12–46)
Monocytes Absolute: 0.3 10*3/uL (ref 0.1–1.0)

## 2011-04-17 LAB — HEMOGLOBIN A1C
Hgb A1c MFr Bld: 7.4 % — ABNORMAL HIGH (ref <5.7)
Mean Plasma Glucose: 166 mg/dL — ABNORMAL HIGH (ref <117)

## 2011-04-17 LAB — PROTIME-INR: INR: 1.62 — ABNORMAL HIGH (ref 0.00–1.49)

## 2011-04-17 SURGERY — PERCUTANEOUS CORONARY STENT INTERVENTION (PCI-S)

## 2011-04-17 MED ORDER — INSULIN ASPART 100 UNIT/ML ~~LOC~~ SOLN: 0.0000 [IU] | Freq: Three times a day (TID) | SUBCUTANEOUS | Status: DC

## 2011-04-17 MED ORDER — INSULIN ASPART 100 UNIT/ML ~~LOC~~ SOLN
12.0000 [IU] | Freq: Once | SUBCUTANEOUS | Status: AC
  Administered 2011-04-17: 12 [IU] via SUBCUTANEOUS

## 2011-04-17 MED ORDER — OXYCODONE-ACETAMINOPHEN 5-325 MG PO TABS: 1.0000 | ORAL_TABLET | ORAL | Status: DC | PRN

## 2011-04-17 MED ORDER — TICAGRELOR 90 MG PO TABS
90.0000 mg | ORAL_TABLET | Freq: Two times a day (BID) | ORAL | Status: DC
  Filled 2011-04-17 (×2): qty 1

## 2011-04-17 MED ORDER — METOPROLOL TARTRATE 25 MG PO TABS
12.5000 mg | ORAL_TABLET | Freq: Two times a day (BID) | ORAL | Status: DC
  Administered 2011-04-17: 22:00:00 via ORAL
  Administered 2011-04-17: 12.5 mg via ORAL
  Filled 2011-04-17: qty 0.5
  Filled 2011-04-17: qty 1
  Filled 2011-04-17: qty 0.5
  Filled 2011-04-17: qty 1

## 2011-04-17 MED ORDER — PNEUMOCOCCAL VAC POLYVALENT 25 MCG/0.5ML IJ INJ
0.5000 mL | INJECTION | INTRAMUSCULAR | Status: AC
  Filled 2011-04-17: qty 0.5

## 2011-04-17 MED ORDER — HEPARIN (PORCINE) IN NACL 2-0.9 UNIT/ML-% IJ SOLN
INTRAMUSCULAR | Status: AC
  Filled 2011-04-17: qty 2000

## 2011-04-17 MED ORDER — SODIUM CHLORIDE 0.9 % IV SOLN: INTRAVENOUS | Status: DC

## 2011-04-17 MED ORDER — FENTANYL CITRATE 0.05 MG/ML IJ SOLN
INTRAMUSCULAR | Status: AC
  Filled 2011-04-17: qty 2

## 2011-04-17 MED ORDER — ASPIRIN 81 MG PO CHEW
81.0000 mg | CHEWABLE_TABLET | Freq: Every day | ORAL | Status: DC
  Administered 2011-04-18 – 2011-04-19 (×2): 81 mg via ORAL
  Filled 2011-04-17 (×2): qty 1

## 2011-04-17 MED ORDER — TICAGRELOR 90 MG PO TABS
90.0000 mg | ORAL_TABLET | Freq: Two times a day (BID) | ORAL | Status: DC
  Administered 2011-04-17 – 2011-04-19 (×4): 90 mg via ORAL
  Filled 2011-04-17 (×6): qty 1

## 2011-04-17 MED ORDER — MORPHINE SULFATE 2 MG/ML IJ SOLN: 1.0000 mg | INTRAMUSCULAR | Status: DC | PRN

## 2011-04-17 MED ORDER — ONDANSETRON HCL 4 MG/2ML IJ SOLN: 4.0000 mg | Freq: Four times a day (QID) | INTRAMUSCULAR | Status: DC | PRN

## 2011-04-17 MED ORDER — ACETAMINOPHEN 325 MG PO TABS
650.0000 mg | ORAL_TABLET | ORAL | Status: DC | PRN
  Administered 2011-04-17: 650 mg via ORAL
  Filled 2011-04-17: qty 2

## 2011-04-17 MED ORDER — SODIUM CHLORIDE 0.9 % IV SOLN
0.2500 mg/kg/h | INTRAVENOUS | Status: AC
  Filled 2011-04-17: qty 250

## 2011-04-17 MED ORDER — LIDOCAINE HCL (PF) 1 % IJ SOLN
INTRAMUSCULAR | Status: AC
  Filled 2011-04-17: qty 30

## 2011-04-17 MED ORDER — BIVALIRUDIN 250 MG IV SOLR
INTRAVENOUS | Status: AC
  Filled 2011-04-17: qty 250

## 2011-04-17 MED ORDER — NITROGLYCERIN 0.4 MG SL SUBL: 0.4000 mg | SUBLINGUAL_TABLET | SUBLINGUAL | Status: DC | PRN

## 2011-04-17 MED ORDER — ATORVASTATIN CALCIUM 80 MG PO TABS
80.0000 mg | ORAL_TABLET | Freq: Every day | ORAL | Status: DC
  Administered 2011-04-17 – 2011-04-18 (×2): 80 mg via ORAL
  Filled 2011-04-17 (×3): qty 1

## 2011-04-17 MED ORDER — NITROGLYCERIN 0.2 MG/ML ON CALL CATH LAB
INTRAVENOUS | Status: AC
  Filled 2011-04-17: qty 1

## 2011-04-17 MED ORDER — TICAGRELOR 90 MG PO TABS
ORAL_TABLET | ORAL | Status: AC
  Administered 2011-04-17: 90 mg via ORAL
  Filled 2011-04-17: qty 2

## 2011-04-17 MED ORDER — MIDAZOLAM HCL 2 MG/2ML IJ SOLN
INTRAMUSCULAR | Status: AC
  Filled 2011-04-17: qty 2

## 2011-04-17 MED ORDER — INSULIN ASPART 100 UNIT/ML ~~LOC~~ SOLN
10.0000 [IU] | Freq: Once | SUBCUTANEOUS | Status: AC
  Administered 2011-04-17: 10 [IU] via SUBCUTANEOUS

## 2011-04-17 MED ORDER — CLONAZEPAM 0.5 MG PO TABS
0.5000 mg | ORAL_TABLET | Freq: Every evening | ORAL | Status: DC | PRN
  Administered 2011-04-17 – 2011-04-18 (×2): 0.5 mg via ORAL
  Filled 2011-04-17 (×2): qty 1

## 2011-04-17 MED ORDER — INSULIN PUMP
Freq: Three times a day (TID) | SUBCUTANEOUS | Status: DC
  Administered 2011-04-18: 8.4 via SUBCUTANEOUS
  Administered 2011-04-18: 6 via SUBCUTANEOUS
  Administered 2011-04-18: 1 via SUBCUTANEOUS
  Administered 2011-04-18: 9.5 via SUBCUTANEOUS
  Administered 2011-04-19: 6.6 via SUBCUTANEOUS
  Filled 2011-04-17: qty 1

## 2011-04-17 MED ORDER — SODIUM CHLORIDE 0.9 % IV SOLN: INTRAVENOUS | Status: AC

## 2011-04-17 NOTE — CV Procedure (Signed)
   Cardiac Catheterization Operative Report  Nathaniel [name] [id number] 3/9/20139:45 AM [name], MD, MD  Procedure Performed:  1. Left Heart Catheterization 2. Selective Coronary Angiography 3. Left ventricular angiogram 4. Thrombectomy LAD 5. PTCA/DES x 1 proximal LAD 6. Angioseal right femoral artery  Operator: Verne Carrow, MD  Indication:  Acute anterior STEMI                                     Procedure Details: The pt was brought to the lab by EMS. Emergency consent was obtained. The patient was sedated with Versed and Fentanyl. The right groin was prepped and draped in the usual manner. Using the modified Seldinger access technique, a 6 French sheath was placed in the right femoral artery. Standard diagnostic catheters were used to perform selective coronary angiography. The ostial LAD was found to be occluded. A XB LAD guide was used to engage the left main. The patient was given a bolus of Angiomax and a drip was started. He was given 180 mg po Brilinta x 1 po. I then advanced a Cougar IC wire down the LAD. A 2.5 x 15 mm balloon was used to pre-dilate the occluded vessel and flow was reestablished. I then made two passes with an [place] thrombectomy catheter secondary to large thrombus burden. I then placed a 3.5 x 16 mm Promus Element DES in the ostium of the LAD. This was post-dilated with a 3.75 x 12 mm [place] balloon. There was an excellent result. A pigtail catheter was used to perform a left ventricular angiogram. An Angioseal was placed in the right femoral artery. Angiomax was continued at a reduced rate.   There were no immediate complications. The patient was taken to the recovery area in stable condition.   Hemodynamic Findings: Central aortic pressure: 106/70 Left ventricular pressure: 110/0/14  Angiographic Findings:  Left main: No obstructive disease.   Left Anterior Descending Artery: 100% ostial occlusion. AFter the vessel was opened, there is a 50%  mid stenosis involving a large portion of the mid vessel.   Circumflex Artery: No obstructive disease. Mild plaque.   Right Coronary Artery: Moderate sized dominant vessel with mild plaque.   Left Ventricular Angiogram: LVEF 45%. There is hypokinesis of the anterior wall and apex.    Impression: 1. Acute anterior STEMI secondary to occluded proximal LAD 2. Successful thrombectomy and DES placement LAD 3. Mild segmental LV systolic dysfunction.   Recommendations: He will need ASA and Brilinta for at least one year. I will start a low dose beta blocker today as well as a statin. He will need an Ace-inhibitor before discharge if his BP will tolerate. CCU today with large anterior wall MI but if stable, can move to telemetry tomorrow.        Complications:  None. The patient tolerated the procedure well.

## 2011-04-17 NOTE — Progress Notes (Signed)
CRITICAL VALUE ALERT  Critical value received:  Cardiac enzymes  Date of notification:  04/17/2011  Time of notification:  1026  Critical value read back:yes  Nurse who received alert:  abarlow  MD notified (1st page):  Thayer Ohm berge,pa  Time of first page:  1030  MD notified (2nd page):  Time of second page:  Responding MD:  Thayer Ohm berge,pa  Time MD responded:  [phone]

## 2011-04-17 NOTE — H&P (Signed)
   Patient ID: Nathaniel [name] MRN: [id number] DOB/AGE: 51-Jan-1962 51 y.o. Admit date: 04/17/2011  Primary Care Physician: [name] Primary Cardiologist: New  HPI: 51 yo WM with history of insulin dependent DM, tobacco abuse, HLD, OSA with onset of chest pain at 5:30 am today. Called EMS. Initial EKG c/w anterior STEMI. Code STEMI called and pt brought emergently to the cath lab. Pt with 8/10 chest pain on arrival with ongoing ST segment elevation. Also c/o SOB and right arm pain.   Review of systems complete and found to be negative unless listed above   Past Medical History  Diagnosis Date  . Diabetes mellitus     dx at age 75, not overweight was told he was type 1  . Hyperlipemia   . Sleep apnea     has CPAP does not use  . Depression     has taken meds for depression before    Family History  Problem Relation Age of Onset  . Multiple sclerosis Mother   . Parkinsonism Father   . Coronary artery disease Neg Hx   . Hypertension Neg Hx   . Diabetes Neg Hx   . Stroke Father   . Colon cancer Neg Hx   . Prostate cancer Neg Hx     History   Social History  . Marital Status: Divorced    Spouse Name: N/A    Number of Children: 1  . Years of Education: N/A   Occupational History  . [employer]    Social History Main Topics  . Smoking status: Current Everyday Smoker -- 0.3 packs/day    Types: Cigarettes  . Smokeless tobacco: Never Used  . Alcohol Use: 3.0 - 3.6 oz/week    5-6 Shots of liquor per week     weekends only  . Drug Use: No  . Sexually Active: Not on file   Other Topics Concern  . Not on file   Social History Narrative   Moved from [place] on July 2010---Regular exercise:no--- diet: to see a diabetes educator     Past Surgical History  Procedure Date  . Tonsillectomy   . Vasectomy     No Known Allergies  Prior to Admission Meds:  See computer record.  Physical Exam: 125/80  80   RR12   General: Well developed, well nourished, pale, dyspneic in  distress HEENT: OP clear, mucus membranes moist  SKIN: warm, dry. No rashes.  Neuro: No focal deficits  Musculoskeletal: Muscle strength 5/5 all ext  Psychiatric: Mood and affect normal  Neck: No JVD, no carotid bruits, no thyromegaly, no lymphadenopathy.  Lungs:Clear bilaterally, no wheezes, rhonci, crackles  Cardiovascular: Regular rate and rhythm. No murmurs, gallops or rubs.  Abdomen:Soft. Bowel sounds present. Non-tender.  Extremities: No lower extremity edema. Pulses are 2 + in the bilateral DP/PT.   EKG: Anterior ST segment elevation, sinus.   ASSESSMENT AND PLAN:  1. Acute anterior STEMI: Emergent cardiac cath with possible PCI. Further plans following cath.    Olivene Cookston 04/17/2011, 10:05 AM

## 2011-04-17 NOTE — Progress Notes (Signed)
Chaplain Note:  Chaplain responded to code STEMI page.  When chaplain arrived, pt was being treated by cath lab staff.  No family was present.  Chaplain provided spiritual comfort and support for pt and cath lab staff.  Chaplain refer this case to resident normally assigned to CICU.   04/17/11 0900  Clinical Encounter Type  Visited With Patient;Health care provider  Visit Type Initial;Code (Code STEMI)  Referral From Other (Comment) (Code Pager)  Spiritual Encounters  Spiritual Needs Emotional  Stress Factors  Patient Stress Factors Health changes  Family Stress Factors None identified    Verdie Shire, chaplain resident [phone]

## 2011-04-17 NOTE — Progress Notes (Signed)
Cbg= 48; pt asymptomatic.  RN notified. Pt. ate Granola bar x1 ; RN reassessed CBG = 103

## 2011-04-18 LAB — CBC
HCT: 39.2 % (ref 39.0–52.0)
MCHC: 34.9 g/dL (ref 30.0–36.0)
Platelets: 213 10*3/uL (ref 150–400)
RDW: 12.8 % (ref 11.5–15.5)
WBC: 10.3 10*3/uL (ref 4.0–10.5)

## 2011-04-18 LAB — BASIC METABOLIC PANEL
BUN: 14 mg/dL (ref 6–23)
Chloride: 102 mEq/L (ref 96–112)
GFR calc Af Amer: >90 mL/min (ref >90)
GFR calc non Af Amer: 78 mL/min — ABNORMAL LOW (ref >90)
Potassium: 3.9 mEq/L (ref 3.5–5.1)
Sodium: 138 mEq/L (ref 135–145)

## 2011-04-18 LAB — LIPID PANEL
LDL Cholesterol: 48 mg/dL (ref 0–99)
Triglycerides: 52 mg/dL (ref <150)
VLDL: 10 mg/dL (ref 0–40)

## 2011-04-18 LAB — GLUCOSE, CAPILLARY
Glucose-Capillary: 161 mg/dL — ABNORMAL HIGH (ref 70–99)
Glucose-Capillary: 226 mg/dL — ABNORMAL HIGH (ref 70–99)
Glucose-Capillary: 65 mg/dL — ABNORMAL LOW (ref 70–99)

## 2011-04-18 MED ORDER — MAGNESIUM HYDROXIDE 400 MG/5ML PO SUSP
30.0000 mL | Freq: Every day | ORAL | Status: DC | PRN
  Administered 2011-04-19: 30 mL via ORAL
  Filled 2011-04-18: qty 30

## 2011-04-18 MED ORDER — LISINOPRIL 2.5 MG PO TABS
2.5000 mg | ORAL_TABLET | Freq: Every day | ORAL | Status: DC
  Administered 2011-04-18 – 2011-04-19 (×2): 2.5 mg via ORAL
  Filled 2011-04-18 (×2): qty 1

## 2011-04-18 MED ORDER — CARVEDILOL 3.125 MG PO TABS
3.1250 mg | ORAL_TABLET | Freq: Two times a day (BID) | ORAL | Status: DC
  Administered 2011-04-18 – 2011-04-19 (×3): 3.125 mg via ORAL
  Filled 2011-04-18 (×5): qty 1

## 2011-04-18 NOTE — Progress Notes (Signed)
Patient ID: Nathaniel [name], male   DOB: [birth year], 51 y.o.   MRN: [id number]    SUBJECTIVE: Slight chest soreness this am.  Tired.       Marland Kitchen aspirin  81 mg Oral Daily  . atorvastatin  80 mg Oral q1800  . bivalirudin      . fentaNYL      . heparin      . insulin aspart  10 Units Subcutaneous Once  . insulin aspart  12 Units Subcutaneous Once  . insulin pump   Subcutaneous TID AC, HS, 0200  . lidocaine      . metoprolol tartrate  12.5 mg Oral BID  . midazolam      . nitroGLYCERIN      . pneumococcal 23 valent vaccine  0.5 mL Intramuscular Tomorrow-1000  . Ticagrelor  90 mg Oral BID  . DISCONTD: insulin aspart  0-15 Units Subcutaneous TID WC  . DISCONTD: Ticagrelor  90 mg Oral BID      Filed Vitals:   04/18/11 0200 04/18/11 0425 04/18/11 0500 04/18/11 0700  BP: 111/66 103/66 94/63 95/65   Pulse: 68 73 71 70  Temp:  98.6 F (37 C)    TempSrc:  Oral    Resp:  12 12   Height:      Weight:   173 lb 15.1 oz (78.9 kg)   SpO2: 97% 98% 98% 98%    Intake/Output Summary (Last 24 hours) at 04/18/11 0853 Last data filed at 04/17/11 1800  Gross per 24 hour  Intake 1535.1 ml  Output   1000 ml  Net  535.1 ml    LABS: Basic Metabolic Panel:  Basename 04/18/11 0612 04/17/11 1026  NA 138 137  K 3.9 5.0  CL 102 99  CO2 27 25  GLUCOSE 154* 292*  BUN 14 15  CREATININE 1.08 0.88  CALCIUM 9.1 10.0  MG -- 1.8  PHOS -- --   Liver Function Tests:  Basename 04/17/11 1026  AST 93*  ALT 27  ALKPHOS 83  BILITOT 0.4  PROT 7.4  ALBUMIN 4.4   No results found for this basename: LIPASE:2,AMYLASE:2 in the last 72 hours CBC:  Basename 04/18/11 0612 04/17/11 1026  WBC 10.3 9.9  NEUTROABS -- 9.0*  HGB 13.7 14.0  HCT 39.2 40.2  MCV 94.0 94.4  PLT 213 221   Cardiac Enzymes:  Basename 04/17/11 2219 04/17/11 1550 04/17/11 1026  CKTOTAL 870* 1129* 593*  CKMB 79.2* 145.5* 76.0*  CKMBINDEX -- -- --  TROPONINI >25.00* >25.00* >25.00*   BNP: No components found with this  basename: POCBNP:3 D-Dimer: No results found for this basename: DDIMER:2 in the last 72 hours Hemoglobin A1C:  Basename 04/17/11 1026  HGBA1C 7.4*   Fasting Lipid Panel:  Basename 04/18/11 0612  CHOL 138  HDL 80  LDLCALC 48  TRIG 52  CHOLHDL 1.7  LDLDIRECT --   Thyroid Function Tests:  Basename 04/17/11 1026  TSH 1.257  T4TOTAL --  T3FREE --  THYROIDAB --   Anemia Panel: No results found for this basename: VITAMINB12,FOLATE,FERRITIN,TIBC,IRON,RETICCTPCT in the last 72 hours  RADIOLOGY: No results found.  PHYSICAL EXAM General: NAD Neck: No JVD, no thyromegaly or thyroid nodule.  Lungs: Clear to auscultation bilaterally with normal respiratory effort. CV: Nondisplaced PMI.  Heart regular S1/S2, no S3/S4, no murmur.  No peripheral edema.  No carotid bruit.  Normal pedal pulses.  Abdomen: Soft, nontender, no hepatosplenomegaly, no distention.  Neurologic: Alert and oriented x 3.  Psych: Normal affect.  Extremities: No clubbing or cyanosis.   TELEMETRY: Reviewed telemetry pt in NSR  ASSESSMENT AND PLAN:  51 yo with history of DM1 with insulin pump now s/p anterior MI with DES to LAD.  1. CAD: Stable this am.  DES to LAD yesterday, anterior MI.  Continue ASA 81, Brilinta, atorvastatin.   2. Ischemic cardiomyopathy: EF 45% by LV-gram, will get echo this am.  Suspect at least mild to moderate LV systolic dysfunction.  Stop metoprolol, start low dose Coreg and lisinopril.  3. Needs to quit smoking.  4. DM: Managed via pump.  5. To telemetry.   Marca Ancona 04/18/2011 8:55 AM

## 2011-04-19 ENCOUNTER — Other Ambulatory Visit: Payer: Self-pay

## 2011-04-19 ENCOUNTER — Encounter (HOSPITAL_COMMUNITY): Payer: Self-pay | Admitting: Nurse Practitioner

## 2011-04-19 DIAGNOSIS — Z720 Tobacco use: Secondary | ICD-10-CM

## 2011-04-19 DIAGNOSIS — I517 Cardiomegaly: Secondary | ICD-10-CM

## 2011-04-19 DIAGNOSIS — I255 Ischemic cardiomyopathy: Secondary | ICD-10-CM

## 2011-04-19 LAB — BASIC METABOLIC PANEL
BUN: 16 mg/dL (ref 6–23)
CO2: 27 mEq/L (ref 19–32)
Calcium: 9 mg/dL (ref 8.4–10.5)
Creatinine, Ser: 1.18 mg/dL (ref 0.50–1.35)
GFR calc non Af Amer: 70 mL/min — ABNORMAL LOW (ref >90)
Glucose, Bld: 159 mg/dL — ABNORMAL HIGH (ref 70–99)
Sodium: 139 mEq/L (ref 135–145)

## 2011-04-19 LAB — GLUCOSE, CAPILLARY: Glucose-Capillary: 239 mg/dL — ABNORMAL HIGH (ref 70–99)

## 2011-04-19 LAB — CBC
MCH: 32.7 pg (ref 26.0–34.0)
MCHC: 34.1 g/dL (ref 30.0–36.0)
MCV: 95.8 fL (ref 78.0–100.0)
Platelets: 204 10*3/uL (ref 150–400)
RBC: 4.04 MIL/uL — ABNORMAL LOW (ref 4.22–5.81)

## 2011-04-19 MED ORDER — TICAGRELOR 90 MG PO TABS: 90.0000 mg | ORAL_TABLET | Freq: Two times a day (BID) | ORAL | Status: DC

## 2011-04-19 MED ORDER — LISINOPRIL 2.5 MG PO TABS: 2.5000 mg | ORAL_TABLET | Freq: Every day | ORAL | Status: DC

## 2011-04-19 MED ORDER — CARVEDILOL 3.125 MG PO TABS: 3.1250 mg | ORAL_TABLET | Freq: Two times a day (BID) | ORAL | Status: DC

## 2011-04-19 MED ORDER — ATORVASTATIN CALCIUM 80 MG PO TABS: 80.0000 mg | ORAL_TABLET | Freq: Every day | ORAL | Status: DC

## 2011-04-19 MED ORDER — ASPIRIN 81 MG PO CHEW: 81.0000 mg | CHEWABLE_TABLET | Freq: Every day | ORAL | Status: AC

## 2011-04-19 MED ORDER — NITROGLYCERIN 0.4 MG SL SUBL: 0.4000 mg | SUBLINGUAL_TABLET | SUBLINGUAL | Status: DC | PRN

## 2011-04-19 MED FILL — Dextrose Inj 5%: INTRAVENOUS | Qty: 50 | Status: AC

## 2011-04-19 NOTE — Discharge Summary (Signed)
Patient ID: Nathaniel [name],  MRN: [id number], DOB/AGE: [birth year] 51 y.o.  Admit date: 04/17/2011 Discharge date: 04/19/2011  Primary Care Provider: [name], MD Primary Cardiologist: C. Clifton , MD  Discharge Diagnoses Principal Problem:  *ST elevation (STEMI) myocardial infarction involving left anterior descending coronary artery Active Problems:  CAD (coronary artery disease)  Ischemic cardiomyopathy  DIABETES MELLITUS, TYPE I  HYPERLIPIDEMIA  DEPRESSION  Tobacco abuse  OSA - on CPAP  Allergies No Known Allergies  Procedures  Cardiac Catheterization and Percutaneous Coronary Intervention 04/17/2011  Hemodynamic Findings: Central aortic pressure: 106/70 Left ventricular pressure: 110/0/14 Angiographic Findings: Left main: No obstructive disease.   Left Anterior Descending Artery: 100% ostial occlusion. AFter the vessel was opened, there is a 50% mid stenosis involving a large portion of the mid vessel - THIS WAS TREATED WITH THROMBECTOMY FOLLOWED BY A 3.5 X PROMUS DES. Circumflex Artery: No obstructive disease. Mild plaque.  Right Coronary Artery: Moderate sized dominant vessel with mild plaque.  Left Ventricular Angiogram: LVEF 45%. There is hypokinesis of the anterior wall and apex.  _________________________________  2D Echocardiogram 04/19/2011 Study Conclusions  - Left ventricle: The cavity size was normal. There was mild   focal basal hypertrophy of the septum. The estimated   ejection fraction was 55%. There is mild hypokinesis of   the distalanteroseptal and apical myocardium. Possible   hypokinesis of the basalinferoseptal myocardium. Doppler   parameters are consistent with abnormal left ventricular   relaxation (grade 1 diastolic dysfunction). - Mitral valve: Trivial regurgitation. - Tricuspid valve: Trivial regurgitation. - Pericardium, extracardiac: There was no pericardial   effusion. _________________________________  History of Present  Illness  51 year old male without prior cardiac history he was in his usual state of health until the morning of admission when he awoke with sudden onset of substernal chest discomfort. He called EMS and upon their arrival, ECG was performed and showed anterior ST segment elevation. He code STEMI was called, and the patient was taken emergently to the [facility] cath lab.  Hospital Course  Patient underwent emergent diagnostic catheterization revealing a total occlusion of the ostial LAD. He otherwise had nonobstructive disease. The LAD was felt to be the culprit vessel and this was subsequently treated with aspiration thrombectomy followed by placement of a 3.5 by 16mm Promus element drug-eluting stent.  EF was calculated at 45%.  Patient tolerated procedure well and post procedure was monitored in the coronary intensive care unit. He did continue to have intermittent chest soreness and eventually peaked his cardiac markers at a CK of 1129, MB of 145.5, and a troponin I of greater than 25. Patient was maintained on aspirin, ticagrelor, beta blocker, ACE inhibitor, and high-dose statin therapy. He was transferred out to the floor on March 10 and has been ambulating without recurrent chest discomfort. 2-D echocardiogram was performed this morning showing normalization of overall LV function. He has been counseled on the importance of smoking cessation. Of note, patient is concerned about his finances with regards to ticagrelor therapy. He has worked with case management and has been provided with a card and prescription for 30 days of free ticagrelor.  He has also been provided with a reduced co-pay card, however at this point he is not sure if his insurance company will accept it. This will be discussed further at the time of his followup. If he is unable to afford ticagrelor, we will have to switch him to Plavix in the outpatient setting.  Discharge Vitals Blood pressure 96/63, pulse 75,  temperature 99.1  F (37.3 C), temperature source Oral, resp. rate 16, height 5\' 9"  (1.753 m), weight 182 lb 12.2 oz (82.9 kg), SpO2 97.00%.  Filed Weights   04/18/11 0500 04/18/11 1435 04/19/11 0500  Weight: 173 lb 15.1 oz (78.9 kg) 177 lb 14.4 oz (80.695 kg) 182 lb 12.2 oz (82.9 kg)   Labs  CBC  Basename 04/19/11 0615 04/18/11 0612 04/17/11 1026  WBC 7.3 10.3 --  NEUTROABS -- -- 9.0*  HGB 13.2 13.7 --  HCT 38.7* 39.2 --  MCV 95.8 94.0 --  PLT 204 213 --   Basic Metabolic Panel  Basename 04/19/11 0615 04/18/11 0612 04/17/11 1026  NA 139 138 --  K 4.1 3.9 --  CL 104 102 --  CO2 27 27 --  GLUCOSE 159* 154* --  BUN 16 14 --  CREATININE 1.18 1.08 --  CALCIUM 9.0 9.1 --  MG -- -- 1.8  PHOS -- -- --   Liver Function Tests  Basename 04/17/11 1026  AST 93*  ALT 27  ALKPHOS 83  BILITOT 0.4  PROT 7.4  ALBUMIN 4.4   Cardiac Enzymes  Basename 04/17/11 2219 04/17/11 1550 04/17/11 1026  CKTOTAL 870* 1129* 593*  CKMB 79.2* 145.5* 76.0*  CKMBINDEX -- -- --  TROPONINI >25.00* >25.00* >25.00*   Hemoglobin A1C  Basename 04/17/11 1026  HGBA1C 7.4*   Fasting Lipid Panel  Basename 04/18/11 0612  CHOL 138  HDL 80  LDLCALC 48  TRIG 52  CHOLHDL 1.7  LDLDIRECT --   Thyroid Function Tests  Basename 04/17/11 1026  TSH 1.257  T4TOTAL --  T3FREE --  THYROIDAB --    Disposition  Pt is being discharged home today in good condition.  Follow-up Plans & Appointments  Follow-up Information    Follow up with [name], MD. (as scheduled.)       Follow up with Tereso Newcomer, PA on 04/28/2011. (11:10 AM - Dr. Gibson Ramp PA)    Contact information:   [address] [place] [address] [phone]          Discharge Medications  Medication List  As of 04/19/2011  3:15 PM   STOP taking these medications         SUMAtriptan 20 MG/ACT nasal spray         TAKE these medications         aspirin 81 MG chewable tablet   Chew 1 tablet (81 mg total) by  mouth daily.      atorvastatin 80 MG tablet   Commonly known as: LIPITOR   Take 1 tablet (80 mg total) by mouth daily.      carvedilol 3.125 MG tablet   Commonly known as: COREG   Take 1 tablet (3.125 mg total) by mouth 2 (two) times daily with a meal.      clonazePAM 0.5 MG tablet   Commonly known as: KLONOPIN   Take 0.5 mg by mouth at bedtime as needed.      GLUCAGON EMERGENCY 1 MG injection   Generic drug: glucagon      insulin lispro 100 UNIT/ML injection (via insulin pump)   Commonly known as: HUMALOG   Uses in insulin pump      LANTUS 100 UNIT/ML injection - as needed (as prev prescribed)   Generic drug: insulin glargine      lisinopril 2.5 MG tablet   Commonly known as: PRINIVIL,ZESTRIL   Take 1 tablet (2.5 mg total) by mouth daily.  nitroGLYCERIN 0.4 MG SL tablet   Commonly known as: NITROSTAT   Place 1 tablet (0.4 mg total) under the tongue every 5 (five) minutes x 3 doses as needed for chest pain.      ONE TOUCH ULTRA TEST test strip   Generic drug: glucose blood   USE AS NEEDED      ONETOUCH DELICA LANCETS Misc      Ticagrelor 90 MG Tabs tablet   Commonly known as: BRILINTA   Take 1 tablet (90 mg total) by mouth 2 (two) times daily.            Outstanding Labs/Studies  None  Duration of Discharge Encounter   Greater than 30 minutes including physician time.  Signed, Nicolasa Ducking NP 04/19/2011, 3:26 PM   Patient seen and examined.  Plan as discussed in my rounding note for today and outlined above. Rollene Rotunda  04/19/2011  3:57 PM

## 2011-04-19 NOTE — Progress Notes (Signed)
UR Completed. Simmons, Daemien Fronczak F [phone]  

## 2011-04-19 NOTE — Progress Notes (Signed)
CARDIAC REHAB PHASE I   PRE:  Rate/Rhythm: 76 SR    BP: sitting 96/63    SaO2:   MODE:  Ambulation: 380 ft   POST:  Rate/Rhythm: 93 Sr    BP: sitting 96/60     SaO2:   Tolerated well. Did have some SOB with increased pace. Pt anxious with many questions. Ed completed. Requests his name be sent to [place] [facility]. Will send . Pt is worried re finances. Anxious to return to work. Restrictions reviewed. [id number]  Harriet Masson CES, ACSM

## 2011-04-19 NOTE — Consult Note (Signed)
Pt is a 1/3 ppd smoker and highly motivated to quit and in action stage. He plan to quit cold [place]. Encourage pt to quit. Referred to 1-800 quit now for f/u and support. Discussed oral fixation substitutes, second hand smoke and in home smoking policy. Reviewed and gave pt Written education/contact information.

## 2011-04-19 NOTE — Progress Notes (Signed)
Patient Name: Nathaniel [name] Date of Encounter: 04/19/2011     Active Problems:  ST elevation (STEMI) myocardial infarction involving left anterior descending coronary artery  CAD (coronary artery disease) Ischemic cardiomyopathy Hyperlipidemia Diabetes mellitus, type I Secondary Problems: Sleep apnea Depression   SUBJECTIVE  Pt denies CP, no SOB. Up OOB without dizziness. Denies lower extremity swelling. Anxious to go home.  CURRENT MEDS    . aspirin  81 mg Oral Daily  . atorvastatin  80 mg Oral q1800  . carvedilol  3.125 mg Oral BID WC  . insulin pump   Subcutaneous TID AC, HS, 0200  . lisinopril  2.5 mg Oral Daily  . pneumococcal 23 valent vaccine  0.5 mL Intramuscular Tomorrow-1000  . Ticagrelor  90 mg Oral BID    OBJECTIVE  Filed Vitals:   04/18/11 1335 04/18/11 1435 04/18/11 2100 04/19/11 0500  BP: 103/60 92/59 95/68  100/66  Pulse: 77 72 74 66  Temp:  97.5 F (36.4 C) 98.7 F (37.1 C) 97.7 F (36.5 C)  TempSrc:  Oral    Resp:  20 18 18   Height:  5\' 9"  (1.753 m)    Weight:  177 lb 14.4 oz (80.695 kg)  182 lb 12.2 oz (82.9 kg)  SpO2: 97% 97% 98% 99%    Intake/Output Summary (Last 24 hours) at 04/19/11 1105 Last data filed at 04/19/11 0900  Gross per 24 hour  Intake    420 ml  Output      0 ml  Net    420 ml   Filed Weights   04/18/11 0500 04/18/11 1435 04/19/11 0500  Weight: 173 lb 15.1 oz (78.9 kg) 177 lb 14.4 oz (80.695 kg) 182 lb 12.2 oz (82.9 kg)    PHYSICAL EXAM  General: Pleasant, NAD. Neuro: Alert and oriented X 3. Moves all extremities spontaneously. Psych: Flat affect. HEENT:  Normal  Neck: Supple without bruits or JVD. Lungs:  Resp regular and unlabored, CTA. Heart: RRR no s3, s4, or murmurs. Abdomen: Soft, non-tender, non-distended, BS + x 4.  Extremities: No clubbing, cyanosis or edema. DP/PT/Radials 2+ and equal bilaterally.  Right groin cath site w/o bleeding/bruit/hematoma.  Accessory Clinical Findings  CBC  Basename  04/19/11 0615 04/18/11 0612 04/17/11 1026  WBC 7.3 10.3 --  NEUTROABS -- -- 9.0*  HGB 13.2 13.7 --  HCT 38.7* 39.2 --  MCV 95.8 94.0 --  PLT 204 213 --   Basic Metabolic Panel  Basename 04/19/11 0615 04/18/11 0612 04/17/11 1026  NA 139 138 --  K 4.1 3.9 --  CL 104 102 --  CO2 27 27 --  GLUCOSE 159* 154* --  BUN 16 14 --  CREATININE 1.18 1.08 --  CALCIUM 9.0 9.1 --  MG -- -- 1.8  PHOS -- -- --   Liver Function Tests  Basename 04/17/11 1026  AST 93*  ALT 27  ALKPHOS 83  BILITOT 0.4  PROT 7.4  ALBUMIN 4.4    Cardiac Enzymes  Basename 04/17/11 2219 04/17/11 1550 04/17/11 1026  CKTOTAL 870* 1129* 593*  CKMB 79.2* 145.5* 76.0*  CKMBINDEX -- -- --  TROPONINI >25.00* >25.00* >25.00*   Hemoglobin A1C  Basename 04/17/11 1026  HGBA1C 7.4*   Fasting Lipid Panel  Basename 04/18/11 0612  CHOL 138  HDL 80  LDLCALC 48  TRIG 52  CHOLHDL 1.7  LDLDIRECT --   Thyroid Function Tests  Basename 04/17/11 1026  TSH 1.257  T4TOTAL --  T3FREE --  THYROIDAB --  TELE  NSR  Radiology/Studies  No results found.  Cardiac Catheterization and Percutaneous Coronary Intervention  Hemodynamic Findings: Central aortic pressure: 106/70 Left ventricular pressure: 110/0/14 Angiographic Findings: Left main: No obstructive disease.   Left Anterior Descending Artery: 100% ostial occlusion. AFter the vessel was opened, there is a 50% mid stenosis involving a large portion of the mid vessel.   - treated with [place] thrombectomy catheter secondary to large thrombus burden followed by 3.5 x 16 mm Promus Element DES in the ostium of the LAD Circumflex Artery: No obstructive disease. Mild plaque.  Right Coronary Artery: Moderate sized dominant vessel with mild plaque.  Left Ventricular Angiogram: LVEF 45%. There is hypokinesis of the anterior wall and apex.    ASSESSMENT AND PLAN  1. Acute Anterior STEMI/CAD: s/p DES to LAD, anterior MI; stable, no CP or SOB this AM.  Continue ASA 81, Brilinta, atorvastatin, & bb.  Has ambulated w/ cardiac rehab - outpt referral made. 2. Ischemic cardiomyopathy: euvolemic.  Wts recorded as being up 9 lbs since admission however no evidence of volume overload - suspect inaccurate recordings.  EF 45% on LV-gram, ECHO completed this AM-report pending. Continue Coreg and Lisinopril. 3. Tobacco abuse: smoking cessation. 4. DM: managed with insulin pump. Uncontrolled with A1c 7.4% this admission, f/u with PCP. 5.  HL:  Cont high dose statin (LDL 48 on prior dose). 6. Disposition: ambulate and plan d/c home this afternoon.  Cardiac follow-up, cardiac rehab.   Signed, Nathaniel Ducking NP  History reviewed with the patient, no changes to be made.  The patient exam reveals right groin without bruit or hematoma, lungs: clear, COR: RRR, Abd: Positive bowel sounds, no rebound no guarding.  All available labs, radiology testing, previous records reviewed. Agree with documented assessment and plan. OK to discharge.  He might not be able to afford the Ticagrelor but he can get 30 days free.  He should take this and then discuss it with Dr Nathaniel [name] at the follow up appt.  He should be seen in the office within 10 days to discuss his meds and return to work.  No work until after this appt.  Nathaniel [name]  2:03 PM 12/25/2010

## 2011-04-19 NOTE — Progress Notes (Signed)
   CARE MANAGEMENT NOTE 04/19/2011  Patient:  Nathaniel [name], Nathaniel [name]   Account Number:  [id number]  Date Initiated:  04/19/2011  Documentation initiated by:  GRAVES-BIGELOW,Arvin Abello  Subjective/Objective Assessment:   Pt admitted with atypical atrial flutter. Plan for home on brilinta.     Action/Plan:   CM did a benefits check for brilinta. Co pay cost for 30 day supply 124.03 and 334.73 for 90 day. Pt stated this price was too high d/t he has diabetes supplies to purchase 3000.00 per yr.   Anticipated DC Date:  04/19/2011   Anticipated DC Plan:  HOME/SELF CARE      DC Planning Services  CM consult      Choice offered to / List presented to:             Status of service:  Completed, signed off Medicare Important Message given?   (If response is "NO", the following Medicare IM given date fields will be blank) Date Medicare IM given:   Date Additional Medicare IM given:    Discharge Disposition:  HOME/SELF CARE  Per UR Regulation:    Comments:  04-19-11 1257 Tomi Bamberger, RN,BSN [phone] CM did supply pt with 30 day free card for brilinta and co pay card. Hopefully pt will be able  to use co pay card and cost will be 18.00. CM will call [facility] to make sure they have medicaiton available. MD please write a 30 day free no refills and then the original with refills. Medication was not available at [facility]. [facility]. Will make pt aware.

## 2011-04-19 NOTE — Discharge Instructions (Signed)
***  PLEASE REMEMBER TO BRING ALL OF YOUR MEDICATIONS TO EACH OF YOUR FOLLOW-UP OFFICE VISITS.  NO HEAVY LIFTING X 4 WEEKS. NO SEXUAL ACTIVITY X 4 WEEKS. NO DRIVING X 2 WEEKS. NO SOAKING BATHS, HOT TUBS, POOLS, ETC., X 7 DAYS.  Groin Site Care Refer to this sheet in the next few weeks. These instructions provide you with information on caring for yourself after your procedure. Your caregiver may also give you more specific instructions. Your treatment has been planned according to current medical practices, but problems sometimes occur. Call your caregiver if you have any problems or questions after your procedure. HOME CARE INSTRUCTIONS  You may shower 24 hours after the procedure. Remove the bandage (dressing) and gently wash the site with plain soap and water. Gently pat the site dry.   Do not apply powder or lotion to the site.  What to expect:  Any bruising will usually fade within 1 to 2 weeks.   Blood that collects in the tissue (hematoma) may be painful to the touch. It should usually decrease in size and tenderness within 1 to 2 weeks.  SEEK IMMEDIATE MEDICAL CARE IF:  You have unusual pain at the groin site or down the affected leg.   You have redness, warmth, swelling, or pain at the groin site.   You have drainage (other than a small amount of blood on the dressing).   You have chills.   You have a fever or persistent symptoms for more than 72 hours.   You have a fever and your symptoms suddenly get worse.   Your leg becomes pale, cool, tingly, or numb.  You have heavy bleeding from the site. Hold pressure on the site. Marland Kitchen

## 2011-04-19 NOTE — Progress Notes (Signed)
  Echocardiogram 2D Echocardiogram has been performed.  Alvie, Fowles 04/19/2011, 10:56 AM

## 2011-04-23 ENCOUNTER — Telehealth: Payer: Self-pay | Admitting: Cardiovascular Disease

## 2011-04-23 NOTE — Telephone Encounter (Signed)
Reviewed meds with Weston Brass, PharmD and none of these should cause diarrhea or upset stomach. She did inform me that there has been a GI virus noted in the community.  I spoke with pt and gave him this information. He will call us back early next week if he continues to have problems

## 2011-04-23 NOTE — Telephone Encounter (Signed)
Spoke with pt who reports 3-4 loose/liquid like stools daily for last 2-3 days. Also complaining of upset stomach that is worse after taking medications. New meds added in hospital are Coreg, Lisinopril and Brilinta. Lipitor was increased from 20 mg daily to 80 mg daily.  He is able to eat and drink

## 2011-04-23 NOTE — Telephone Encounter (Signed)
New Msg: pt calling wanting to speak with nurse/MD regarding medication pt was put on following MI. Pt stated medication is causing him to have an upset stomach/diarrhea and pt wants to know if medications need to be adjusted. Please return pt call to discuss further.

## 2011-04-28 ENCOUNTER — Ambulatory Visit (INDEPENDENT_AMBULATORY_CARE_PROVIDER_SITE_OTHER): Payer: PRIVATE HEALTH INSURANCE | Admitting: Physician Assistant

## 2011-04-28 ENCOUNTER — Encounter: Payer: Self-pay | Admitting: *Deleted

## 2011-04-28 ENCOUNTER — Encounter: Payer: Self-pay | Admitting: Physician Assistant

## 2011-04-28 VITALS — BP 109/68 | HR 61 | Ht 69.0 in | Wt 178.0 lb

## 2011-04-28 DIAGNOSIS — F17200 Nicotine dependence, unspecified, uncomplicated: Secondary | ICD-10-CM

## 2011-04-28 DIAGNOSIS — I2510 Atherosclerotic heart disease of native coronary artery without angina pectoris: Secondary | ICD-10-CM

## 2011-04-28 DIAGNOSIS — Z720 Tobacco use: Secondary | ICD-10-CM

## 2011-04-28 DIAGNOSIS — E785 Hyperlipidemia, unspecified: Secondary | ICD-10-CM

## 2011-04-28 LAB — BASIC METABOLIC PANEL
BUN: 15 mg/dL (ref 6–23)
Calcium: 9.2 mg/dL (ref 8.4–10.5)
GFR: 77.5 mL/min (ref >60.00)
Potassium: 5.4 mEq/L — ABNORMAL HIGH (ref 3.5–5.1)

## 2011-04-28 NOTE — Progress Notes (Signed)
[address] [place], [place]  [address] Phone: [phone] Fax:  [phone]  Date:  04/28/2011   Name:  Nathaniel [name]       DOB:  [birth year] MRN:  [id number]  PCP:  Dr. Drue Novel  Primary Cardiologist:  Dr. Verne Carrow  Primary Electrophysiologist:  None    History of Present Illness: Nathaniel [name] is a 50 y.o. male who presents for post hospital follow up.  He has a history of diabetes, hyperlipidemia, sleep apnea, tobacco abuse.  He was admitted 3/9-3/11 with an anterior STEMI.  Emergent LHC 04/17/11: Ostial LAD 100% occluded, mid 50%, mild plaque in the circumflex and RCA, anterior wall and apical hypokinesis with an EF of 45%.  PCI: Promus DES to the ostial LAD.  He was placed on aspirin and Ticagrelor.  There was a question of whether or not this would be affordable.  The discharge notes that he could be switched to Plavix if Ticagrelor could not be obtained.    Hospital labs: Hemoglobin 13.2, potassium 4.1, creatinine 1.18, ALT 27, troponin greater than 25, hemoglobin A1c 7.4, TC 138, HDL 80, LDL 48, triglycerides 52, TSH 1.257.  Doing well.  The patient denies chest pain, shortness of breath, syncope, orthopnea, PND or significant pedal edema.  Eager to get back to work.    Past Medical History  Diagnosis Date  . Diabetes mellitus     dx at age 23, not overweight was told he was type 1  . Hyperlipemia   . Sleep apnea     has CPAP does not use  . Depression     has taken meds for depression before  . CAD (coronary artery disease)     a. ant stemi 04/2011 - totalled LAD treated w/ thrombectomy & 3.5 x 16 Promus DES.  OTW nonobs dzs.  EF 45%  . Ischemic cardiomyopathy     a. EF 45% by V-gram 04/2011;  b. Echo 04/19/2011 EF 55%, Gr 1 DD.  . Tobacco abuse     Current Outpatient Prescriptions  Medication Sig Dispense Refill  . aspirin 81 MG chewable tablet Chew 1 tablet (81 mg total) by mouth daily.      Marland Kitchen atorvastatin (LIPITOR) 80 MG tablet  Take 1 tablet (80 mg total) by mouth daily.  30 tablet  6  . carvedilol (COREG) 3.125 MG tablet Take 1 tablet (3.125 mg total) by mouth 2 (two) times daily with a meal.  60 tablet  6  . clonazePAM (KLONOPIN) 0.5 MG tablet Take 0.5 mg by mouth at bedtime as needed.       Marland Kitchen GLUCAGON EMERGENCY 1 MG injection       . insulin lispro (HUMALOG) 100 UNIT/ML injection Uses in insulin pump  5 vial  1  . LANTUS 100 UNIT/ML injection       . lisinopril (PRINIVIL,ZESTRIL) 2.5 MG tablet Take 1 tablet (2.5 mg total) by mouth daily.  30 tablet  6  . nitroGLYCERIN (NITROSTAT) 0.4 MG SL tablet Place 1 tablet (0.4 mg total) under the tongue every 5 (five) minutes x 3 doses as needed for chest pain.  25 tablet  3  . ONE TOUCH ULTRA TEST test strip USE AS NEEDED  100 each  1  . ONETOUCH DELICA LANCETS MISC       . Ticagrelor (BRILINTA) 90 MG TABS tablet Take 1 tablet (90 mg total) by mouth 2 (two) times daily.  60 tablet  6    Allergies: No  Known Allergies  History  Substance Use Topics  . Smoking status: Former Smoker    Types: Cigarettes    Quit date: 04/17/2011  . Smokeless tobacco: Never Used  . Alcohol Use: 3.0 - 3.6 oz/week    5-6 Shots of liquor per week     weekends only     ROS:  Please see the history of present illness.    All other systems reviewed and negative.   PHYSICAL EXAM: VS:  BP 109/68  Pulse 61  Ht 5\' 9"  (1.753 m)  Wt 178 lb (80.74 kg)  BMI 26.29 kg/m2 Well nourished, well developed, in no acute distress HEENT: normal Neck: no JVD Cardiac:  normal S1, S2; RRR; no murmur Lungs:  clear to auscultation bilaterally, no wheezing, rhonchi or rales Abd: soft, nontender, no hepatomegaly Ext: no edema; right groin without hematoma or bruit  Skin: warm and dry Neuro:  CNs 2-12 intact, no focal abnormalities noted  EKG:  Sinus bradycardia, heart rate 52, normal axis, septal Q waves with anterolateral T wave inversions-evolving anterior wall MI  ASSESSMENT AND PLAN:  1. CAD  (coronary artery disease)  Doing well post Ant MI.  We discussed the importance of dual antiplatelet therapy.  He is trying to get Brilinta covered with his insurance.  If he cannot afford it, he will call us and we can try to switch to Plavix.  He plans to go to cardiac rehab if his insurance will cover it.  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  I recommend he remain on coreg and lisinopril.  Check follow up bmet today.  Follow up with Dr. Verne Carrow in 6 weeks.   2. HYPERLIPIDEMIA  Managed by his endocrinologist.  He will obtain follow up Lipids and LFTs in 8 weeks with Dr. Sharl Ma.   3. Tobacco abuse  Luckily, he quit.      Luna Glasgow, PA-C  11:27 AM 04/28/2011

## 2011-04-28 NOTE — Patient Instructions (Signed)
Your physician recommends that you schedule a follow-up appointment in: 6 WEEKS WITH DR. [facility]  Your physician recommends that you return for lab work in: TODAY BMET 414.01  PLEASE CALL PAT ADLEMAN, RN OR Lark Langenfeld, CMA [phone] TO LET us KNOW IF YOU WERE ABLE TO OBTAIN BRILANTA AND IF NOT; THEN YOU WILL NEED TO BE SWITCHED TO PLAVIX

## 2011-04-30 ENCOUNTER — Telehealth: Payer: Self-pay | Admitting: *Deleted

## 2011-04-30 ENCOUNTER — Telehealth: Payer: Self-pay | Admitting: Physician Assistant

## 2011-04-30 DIAGNOSIS — I2589 Other forms of chronic ischemic heart disease: Secondary | ICD-10-CM

## 2011-04-30 NOTE — Telephone Encounter (Signed)
lmom on home # for ptcb, pt out office recording on work #. Did state on message to hold lisinopril for now. Danielle Rankin

## 2011-04-30 NOTE — Telephone Encounter (Signed)
Message copied by Tarri Fuller on Fri Apr 30, 2011  2:58 PM ------      Message from: [place], [place] T      Created: Wed Apr 28, 2011  5:30 PM       K+ high      Stop Lisinopril      Repeat BMET Friday AM      Tereso Newcomer, PA-C  5:30 PM 04/28/2011

## 2011-04-30 NOTE — Telephone Encounter (Signed)
ptcb and has been notified of lab results and will come in on 05/03/11 for repeat bmet. pt asdvised to hold lisinopril for now, gave me verbal understanding today. Danielle Rankin

## 2011-04-30 NOTE — Telephone Encounter (Signed)
Fu call °Patient returning your call about test results °

## 2011-04-30 NOTE — Telephone Encounter (Signed)
Message copied by Tarri Fuller on Fri Apr 30, 2011  5:19 PM ------      Message from: [place], [place] T      Created: Wed Apr 28, 2011  5:30 PM       K+ high      Stop Lisinopril      Repeat BMET Friday AM      Tereso Newcomer, PA-C  5:30 PM 04/28/2011

## 2011-05-03 ENCOUNTER — Other Ambulatory Visit: Payer: Self-pay | Admitting: Internal Medicine

## 2011-05-03 ENCOUNTER — Other Ambulatory Visit (INDEPENDENT_AMBULATORY_CARE_PROVIDER_SITE_OTHER): Payer: PRIVATE HEALTH INSURANCE

## 2011-05-03 DIAGNOSIS — I2589 Other forms of chronic ischemic heart disease: Secondary | ICD-10-CM

## 2011-05-03 NOTE — Telephone Encounter (Signed)
Refill done.  

## 2011-05-04 ENCOUNTER — Telehealth: Payer: Self-pay | Admitting: *Deleted

## 2011-05-04 LAB — BASIC METABOLIC PANEL
CO2: 27 mEq/L (ref 19–32)
Calcium: 9 mg/dL (ref 8.4–10.5)
GFR: 79.2 mL/min (ref >60.00)
Sodium: 135 mEq/L (ref 135–145)

## 2011-05-04 NOTE — Telephone Encounter (Signed)
Message copied by Tarri Fuller on Tue May 04, 2011  2:11 PM ------      Message from: [place], [place] T      Created: Tue May 04, 2011 12:49 PM       Ok      Would like to try to restart Lisinopril      Restart Lisinopril 1/2 tablet in one week      Check follow up BMET 4-5 days later      If K+ goes up again, will have to remain off      Tereso Newcomer, [place]  12:49 PM 05/04/2011

## 2011-05-04 NOTE — Telephone Encounter (Signed)
Follow- up: ° ° °Patient returned your phone call. Please call back. °

## 2011-05-04 NOTE — Telephone Encounter (Signed)
called pt @ both home and work #'s and lmom for ptcb to go over lab results and recommendations per Tereso Newcomer, PA. Danielle Rankin

## 2011-05-06 ENCOUNTER — Telehealth: Payer: Self-pay | Admitting: *Deleted

## 2011-05-06 DIAGNOSIS — I2589 Other forms of chronic ischemic heart disease: Secondary | ICD-10-CM

## 2011-05-06 NOTE — Telephone Encounter (Signed)
Pt notified of lab results and recommendations to start back on 1/2 tab of lisinopril in 1 week, pt will start back on 4/1 and will have repeat bmet 05/14/11. Pt states he had gone back to work on Monday 4/25 and felt good on Monday and Tuesday, but states as of 2 pm yesterday wed he felt very fatigued and had to go home and take a nap. He denies any cp, sob with the fatigue. Pt also states that he has been bruising a lot more easily, I verified that he is taking ASA 81 mg and that ASA may cause bruising due to keeping blood thinner; says he doesn't think he has bumped into anything, says bruising is on upper and lower arms, I told him even if he just leaned against something he may get a bruise because of the ASA. I advised pt that I would let Scott Weaver, PA know of the sxms he had yesterday, pt gave verbal understanding today. Maika Mcelveen   

## 2011-05-06 NOTE — Telephone Encounter (Signed)
Message copied by Tarri Fuller on Thu May 06, 2011  8:49 AM ------      Message from: [place], [place] T      Created: Tue May 04, 2011 12:49 PM       Ok      Would like to try to restart Lisinopril      Restart Lisinopril 1/2 tablet in one week      Check follow up BMET 4-5 days later      If K+ goes up again, will have to remain off      Tereso Newcomer, [place]  12:49 PM 05/04/2011

## 2011-05-06 NOTE — Telephone Encounter (Signed)
Pt notified of lab results and recommendations to start back on 1/2 tab of lisinopril in 1 week, pt will start back on 4/1 and will have repeat bmet 05/14/11. Pt states he had gone back to work on Monday 4/25 and felt good on Monday and Tuesday, but states as of 2 pm yesterday wed he felt very fatigued and had to go home and take a nap. He denies any cp, sob with the fatigue. Pt also states that he has been bruising a lot more easily, I verified that he is taking ASA 81 mg and that ASA may cause bruising due to keeping blood thinner; says he doesn't think he has bumped into anything, says bruising is on upper and lower arms, I told him even if he just leaned against something he may get a bruise because of the ASA. I advised pt that I would let Tereso Newcomer, PA know of the sxms he had yesterday, pt gave verbal understanding today. Danielle Rankin

## 2011-05-14 ENCOUNTER — Ambulatory Visit: Payer: PRIVATE HEALTH INSURANCE | Admitting: *Deleted

## 2011-05-14 DIAGNOSIS — I2589 Other forms of chronic ischemic heart disease: Secondary | ICD-10-CM

## 2011-05-14 LAB — BASIC METABOLIC PANEL
Chloride: 103 mEq/L (ref 96–112)
Potassium: 4.5 mEq/L (ref 3.5–5.3)
Sodium: 135 mEq/L (ref 135–145)

## 2011-05-17 ENCOUNTER — Telehealth: Payer: Self-pay | Admitting: Cardiovascular Disease

## 2011-05-17 NOTE — Telephone Encounter (Signed)
Please return call to patient on hm# [phone] concerning 05/14/11 lab results.

## 2011-05-17 NOTE — Telephone Encounter (Signed)
Pt aware of results 

## 2011-05-18 ENCOUNTER — Other Ambulatory Visit: Payer: Self-pay | Admitting: *Deleted

## 2011-05-18 MED ORDER — ATORVASTATIN CALCIUM 80 MG PO TABS: 80.0000 mg | ORAL_TABLET | Freq: Every day | ORAL | Status: DC

## 2011-05-18 MED ORDER — CARVEDILOL 3.125 MG PO TABS: 3.1250 mg | ORAL_TABLET | Freq: Two times a day (BID) | ORAL | Status: DC

## 2011-05-20 ENCOUNTER — Other Ambulatory Visit: Payer: Self-pay | Admitting: Cardiovascular Disease

## 2011-05-20 DIAGNOSIS — I2510 Atherosclerotic heart disease of native coronary artery without angina pectoris: Secondary | ICD-10-CM

## 2011-05-20 NOTE — Telephone Encounter (Signed)
[facility] states they requested refill by fax last week and still has not gotten an approval, pt needs refill of lisinopril,  atorvastatin, carvedilol and requesting dr Nathaniel [name] refill his clonazepam as well, pt will be out of atorvastatin and carvedilol before it gets to him, so he is requesting samples, pls call  pt [phone]

## 2011-05-21 ENCOUNTER — Other Ambulatory Visit: Payer: Self-pay

## 2011-05-21 MED ORDER — LISINOPRIL 2.5 MG PO TABS: 2.5000 mg | ORAL_TABLET | Freq: Every day | ORAL | Status: DC

## 2011-05-25 MED ORDER — LISINOPRIL 2.5 MG PO TABS: 2.5000 mg | ORAL_TABLET | Freq: Every day | ORAL | Status: DC

## 2011-05-25 MED ORDER — CARVEDILOL 3.125 MG PO TABS: 3.1250 mg | ORAL_TABLET | Freq: Two times a day (BID) | ORAL | Status: DC

## 2011-05-25 MED ORDER — LISINOPRIL 2.5 MG PO TABS: ORAL_TABLET | ORAL | Status: DC

## 2011-05-25 NOTE — Telephone Encounter (Signed)
I spoke with [facility] and verified they received refills for atorvastatin and coreg on May 18, 2011 and it was shipped to pt yesterday.  I spoke with pt and gave him this information. He is out of Coreg so prescription will be sent to [facility] on [address] for 30 day supply.  He has enough atorvastatin to last until mail shipment arrives.  Lisinopril refill was [facility] today. Pt aware he is to take half of 2.5 mg tablet lisinopril daily.  I asked pt to contact primary care for klonopin refill and he is agreeable with this plan.

## 2011-06-02 ENCOUNTER — Ambulatory Visit (INDEPENDENT_AMBULATORY_CARE_PROVIDER_SITE_OTHER): Payer: PRIVATE HEALTH INSURANCE | Admitting: Internal Medicine

## 2011-06-02 VITALS — BP 112/68 | HR 68 | Temp 98.0°F | Wt 179.0 lb

## 2011-06-02 DIAGNOSIS — F3289 Other specified depressive episodes: Secondary | ICD-10-CM

## 2011-06-02 DIAGNOSIS — E109 Type 1 diabetes mellitus without complications: Secondary | ICD-10-CM

## 2011-06-02 DIAGNOSIS — F329 Major depressive disorder, single episode, unspecified: Secondary | ICD-10-CM

## 2011-06-02 MED ORDER — CLONAZEPAM 0.5 MG PO TABS: 0.5000 mg | ORAL_TABLET | Freq: Every evening | ORAL | Status: DC | PRN

## 2011-06-02 MED ORDER — ESCITALOPRAM OXALATE 10 MG PO TABS: 10.0000 mg | ORAL_TABLET | Freq: Every day | ORAL | Status: DC

## 2011-06-02 NOTE — Assessment & Plan Note (Signed)
Recommend to call Dr. Sharl Ma for management of his low blood sugars

## 2011-06-02 NOTE — Progress Notes (Signed)
  Subjective:    Patient ID: Nathaniel [name], male    DOB: Oct 13, 1960, 51 y.o.   MRN: [id number]  HPI Routine visit Since the last time I saw him, he had any acute MI. Fortunately he is doing well, he quit tobacco. His main concern today is depression mostly related his recent heart attack & finances. Many years ago he did try Paxil and Prozac, they worked temporarily but then the effect wear off. He cannot take Cymbalta. He is also trying to be more active but unfortunately that is driving his sugars low and causing fatigue.   Past Medical History  Diagnosis Date  . Diabetes mellitus     dx at age 27, not overweight was told he was type 1  . Hyperlipemia   . Sleep apnea     has CPAP does not use  . Depression     has taken meds for depression before  . CAD (coronary artery disease)     a. ant stemi 04/2011 - totalled LAD treated w/ thrombectomy & 3.5 x 16 Promus DES.  OTW nonobs dzs.  EF 45%  . Ischemic cardiomyopathy     a. EF 45% by V-gram 04/2011;  b. Echo 04/19/2011 EF 55%, Gr 1 DD.  . Tobacco abuse      Review of Systems No chest pain, shortness of breath or edema. No anxiety per se, no suicidal ideas. Sleeping with some difficulty, would like a refill of clonazepam.     Objective:   Physical Exam   General -- alert, well-developed, and nooverweight appearing. No apparent distress.  neurologic-- alert & oriented X3 and strength normal in all extremities. Psych-- Cognition and judgment appear intact. Alert and cooperative with normal attention span and concentration.  not anxious appearing and not depressed appearing.        Assessment & Plan:  Today , I spent more than 25 min with the patient, >50% of the time counseling, and discussing the case with the pharmacyst (see a/p)

## 2011-06-02 NOTE — Assessment & Plan Note (Addendum)
Patient has developed depression , he is counseled today We discussed the role of counseling, and a list  of counselors in the area provided. I think he will benefit from SSRIs, Lexapro seems to be a good choice. Interaction between Lexapro and brilanta  were researched, UTD recommend to monitor therapy, I got concerned and called the pharmacist at the hospital, they could not find any type of interactions in their system. Plan: Start Lexapro Reassess in 2 weeks. Patient to let me know if something changes, he sees  bleeding or bruising.

## 2011-06-03 ENCOUNTER — Encounter: Payer: Self-pay | Admitting: Internal Medicine

## 2011-06-10 ENCOUNTER — Ambulatory Visit: Payer: PRIVATE HEALTH INSURANCE | Admitting: Cardiovascular Disease

## 2011-06-14 ENCOUNTER — Telehealth: Payer: Self-pay | Admitting: Cardiovascular Disease

## 2011-06-14 NOTE — Telephone Encounter (Signed)
Agree. Thanks. chris 

## 2011-06-14 NOTE — Telephone Encounter (Signed)
Spoke with pt. He reports extreme fatigue and no energy to do anything after a day at work.  States he was fine on Lipitor 20 mg.  States he had similar problems when on Crestor and simvastatin.  No chest pain or shortness of breath. No muscle aches.  I asked pt to hold Lipitor until he sees Dr. Drue Novel on Jun 23, 2011 to see if symptoms improve and discuss this with him at that visit.  Also has appt with Dr. Clifton James on Jun 29, 2011 and will discuss at that time.

## 2011-06-14 NOTE — Telephone Encounter (Signed)
New msg Pt said he has been taking lipitor and he has been very fatigued. He wanted to know if he should adjust the dosage.

## 2011-06-23 ENCOUNTER — Ambulatory Visit (INDEPENDENT_AMBULATORY_CARE_PROVIDER_SITE_OTHER): Payer: PRIVATE HEALTH INSURANCE | Admitting: Internal Medicine

## 2011-06-23 VITALS — BP 114/70 | HR 55 | Temp 97.5°F | Wt 177.0 lb

## 2011-06-23 DIAGNOSIS — F329 Major depressive disorder, single episode, unspecified: Secondary | ICD-10-CM

## 2011-06-23 DIAGNOSIS — G4733 Obstructive sleep apnea (adult) (pediatric): Secondary | ICD-10-CM

## 2011-06-23 DIAGNOSIS — E785 Hyperlipidemia, unspecified: Secondary | ICD-10-CM

## 2011-06-23 DIAGNOSIS — E109 Type 1 diabetes mellitus without complications: Secondary | ICD-10-CM

## 2011-06-23 DIAGNOSIS — F3289 Other specified depressive episodes: Secondary | ICD-10-CM

## 2011-06-23 DIAGNOSIS — Z2911 Encounter for prophylactic immunotherapy for respiratory syncytial virus (RSV): Secondary | ICD-10-CM

## 2011-06-23 DIAGNOSIS — Z23 Encounter for immunization: Secondary | ICD-10-CM

## 2011-06-23 MED ORDER — ESCITALOPRAM OXALATE 10 MG PO TABS: 10.0000 mg | ORAL_TABLET | Freq: Every day | ORAL | Status: DC

## 2011-06-23 NOTE — Progress Notes (Signed)
  Subjective:    Patient ID: Nathaniel [name], male    DOB: May 21, 1960, 51 y.o.   MRN: [id number]  HPI Followup from previous visit Depression, he started Lexapro, good compliance and tolerance, symptoms have definitely improved. High cholesterol, he was extremely fatigued with Lipitor 80 mg, after he consult with cardiology he stop it and the next day he felt better. Would like to have a zostavax shot.  Past Medical History  Diagnosis Date  . Diabetes mellitus     dx at age 68, not overweight was told he was type 1  . Hyperlipemia   . Sleep apnea     has CPAP does not use  . Depression     has taken meds for depression before  . CAD (coronary artery disease)     a. ant stemi 04/2011 - totalled LAD treated w/ thrombectomy & 3.5 x 16 Promus DES.  OTW nonobs dzs.  EF 45%  . Ischemic cardiomyopathy     a. EF 45% by V-gram 04/2011;  b. Echo 04/19/2011 EF 55%, Gr 1 DD.  . Tobacco abuse      Review of Systems Sleeps better, using clonazepam as needed Has a CPAP machine, has not been able to use in a while. Denies nausea, vomiting, diarrhea. As far as diabetes, he already saw Dr. Sharl Ma     Objective:   Physical Exam  General -- alert, well-developed, and nooverweight appearing. No apparent distress.  neurologic-- alert & oriented X3 and strength normal in all extremities.  Psych-- Cognition and judgment appear intact. Alert and cooperative with normal attention span and concentration. not anxious appearing and not depressed appearing. Seems in better spirits today compared to last time     Assessment & Plan:  Today , I spent more than 20 min with the patient, >50% of the time counseling regards depression and OSA  Request a Zostavax, I see no contraindication. Shot provided

## 2011-06-23 NOTE — Assessment & Plan Note (Signed)
Saw Dr. Sharl Ma recently

## 2011-06-23 NOTE — Assessment & Plan Note (Addendum)
Was diagnosed with sleep apnea years ago , he has a CPAP, a full face mask but cannot tolerate. Encourage to go to [facility] and try other masks. Titration may be needed, asked him to let me know for a referral. I also let him known about increased risk of cardiovascular events with untreated sleep apnea

## 2011-06-23 NOTE — Assessment & Plan Note (Signed)
Patient become extremely fatigued with Lipitor 80 mg. Prior to his MI, he was tolerating 20 mg without problems. He reports a history of previous fatigue with Crestor and simvastatin  (doses? )  plan: Go back to Lipitor, 40 mg daily , see how he does.  Will  Follow up   with cardiology soon and  plans to discuss with them further treatment

## 2011-06-23 NOTE — Patient Instructions (Signed)
Please visit  [facility], they can help you with a new CPAP mask. If you need a prescription please let me know. [address] [place], [place] [address] [phone] -------------------- Try Lipitor 80 mg half tablet daily, see how that works. If it is not working, we may need to go back to Lipitor 20 mg.

## 2011-06-23 NOTE — Assessment & Plan Note (Addendum)
Improving, continue with Lexapro Has not been able to see a counselor. Encouraged him to think about it. Followup in 3 months

## 2011-06-24 ENCOUNTER — Encounter: Payer: Self-pay | Admitting: Internal Medicine

## 2011-06-29 ENCOUNTER — Ambulatory Visit (INDEPENDENT_AMBULATORY_CARE_PROVIDER_SITE_OTHER): Payer: PRIVATE HEALTH INSURANCE | Admitting: Cardiovascular Disease

## 2011-06-29 ENCOUNTER — Encounter: Payer: Self-pay | Admitting: Cardiovascular Disease

## 2011-06-29 VITALS — BP 119/80 | HR 54 | Ht 69.0 in | Wt 177.0 lb

## 2011-06-29 DIAGNOSIS — I2510 Atherosclerotic heart disease of native coronary artery without angina pectoris: Secondary | ICD-10-CM

## 2011-06-29 DIAGNOSIS — E785 Hyperlipidemia, unspecified: Secondary | ICD-10-CM

## 2011-06-29 MED ORDER — ATORVASTATIN CALCIUM 20 MG PO TABS: 20.0000 mg | ORAL_TABLET | Freq: Every day | ORAL | Status: DC

## 2011-06-29 NOTE — Patient Instructions (Signed)
Your physician wants you to follow-up in: 6 months.  You will receive a reminder letter in the mail two months in advance. If you don't receive a letter, please call our office to schedule the follow-up appointment.  Your physician has recommended you make the following change in your medication: Start Lipitor 20 mg by mouth daily.  Your physician recommends that you return for fasting lab work in:6 months on day of appt with Dr. Clifton James (lipid and liver profile)

## 2011-06-29 NOTE — Progress Notes (Signed)
History of Present Illness: 51 y.o. male with history of DM, HTN, OSA, tobacco abuse, OSA and CAD with recent anterior STEMI who is here today for cardiac follow up.  He was admitted 3/9-3/11/13 with an anterior STEMI. Emergent LHC 04/17/11: Ostial LAD 100% occluded, mid 50%, mild plaque in the circumflex and RCA, anterior wall and apical hypokinesis with an EF of 45%. PCI: Promus DES to the ostial LAD. He was placed on aspirin and Ticagrelor. He was seen in our office by Tereso Newcomer, PA-C 04/28/11.   He is here today for follow up. He has been doing well. The patient denies chest pain, shortness of breath, syncope, orthopnea, PND or significant pedal edema. He did not tolerate 80 mg of Lipitor. This made him feel fatigued. He stopped the Lipitor and had his fatigue improve. He has stopped smoking completely. He has been walking  Every day. He tolerated Lipitor 20 mg for many years before his MI.   Primary Care Physician: Drue Novel  Last Lipid Profile:  Lipid Panel     Component Value Date/Time   CHOL 138 04/18/2011 0612   TRIG 52 04/18/2011 0612   HDL 80 04/18/2011 0612   CHOLHDL 1.7 04/18/2011 0612   VLDL 10 04/18/2011 0612   LDLCALC 48 04/18/2011 0612     Past Medical History  Diagnosis Date  . Diabetes mellitus     dx at age 66, not overweight was told he was type 1  . Hyperlipemia   . Sleep apnea     has CPAP does not use  . Depression     has taken meds for depression before  . CAD (coronary artery disease)     a. ant stemi 04/2011 - totalled LAD treated w/ thrombectomy & 3.5 x 16 Promus DES.  OTW nonobs dzs.  EF 45%  . Ischemic cardiomyopathy     a. EF 45% by V-gram 04/2011;  b. Echo 04/19/2011 EF 55%, Gr 1 DD.  . Tobacco abuse     Past Surgical History  Procedure Date  . Tonsillectomy   . Vasectomy     Current Outpatient Prescriptions  Medication Sig Dispense Refill  . aspirin 81 MG chewable tablet Chew 1 tablet (81 mg total) by mouth daily.      . carvedilol (COREG) 3.125 MG  tablet Take 1 tablet (3.125 mg total) by mouth 2 (two) times daily with a meal.  60 tablet  6  . clonazePAM (KLONOPIN) 0.5 MG tablet Take 1 tablet (0.5 mg total) by mouth at bedtime as needed.  30 tablet  0  . escitalopram (LEXAPRO) 10 MG tablet Take 1 tablet (10 mg total) by mouth daily.  90 tablet  1  . fluticasone (VERAMYST) 27.5 MCG/SPRAY nasal spray Place 2 sprays into the nose daily.      Marland Kitchen GLUCAGON EMERGENCY 1 MG injection       . HUMALOG 100 UNIT/ML injection USE IN INSULIN PUMP AS DIRECTED BY PRESCRIBER  5 vial  1  . LANTUS 100 UNIT/ML injection       . lisinopril (PRINIVIL,ZESTRIL) 2.5 MG tablet Take half tablet by mouth daily  45 tablet  1  . nitroGLYCERIN (NITROSTAT) 0.4 MG SL tablet Place 1 tablet (0.4 mg total) under the tongue every 5 (five) minutes x 3 doses as needed for chest pain.  25 tablet  3  . ONE TOUCH ULTRA TEST test strip USE AS NEEDED  100 each  1  . ONETOUCH DELICA LANCETS MISC       .  SUMAtriptan Succinate (IMITREX PO) Take by mouth as needed.       . Ticagrelor (BRILINTA) 90 MG TABS tablet Take 1 tablet (90 mg total) by mouth 2 (two) times daily.  60 tablet  6  . DISCONTD: SUMAtriptan (IMITREX) 20 MG/ACT nasal spray Place 1 spray into the nose every 2 (two) hours as needed. For cluster headaches. No more than 2 sprays per day        No Known Allergies  History   Social History  . Marital Status: Divorced    Spouse Name: N/A    Number of Children: 1  . Years of Education: N/A   Occupational History  . [employer]    Social History Main Topics  . Smoking status: Former Smoker    Types: Cigarettes    Quit date: 04/17/2011  . Smokeless tobacco: Never Used  . Alcohol Use: 3.0 - 3.6 oz/week    5-6 Shots of liquor per week     weekends only  . Drug Use: No  . Sexually Active: Not Currently   Other Topics Concern  . Not on file   Social History Narrative   Moved from [place] on July 2010---Regular exercise:no--- diet: to see a diabetes educator      Family History  Problem Relation Age of Onset  . Multiple sclerosis Mother   . Parkinsonism Father   . Coronary artery disease Neg Hx   . Hypertension Neg Hx   . Diabetes Neg Hx   . Stroke Father   . Colon cancer Neg Hx   . Prostate cancer Neg Hx     Review of Systems:  As stated in the HPI and otherwise negative.   BP 119/80  Pulse 54  Ht 5\' 9"  (1.753 m)  Wt 177 lb (80.287 kg)  BMI 26.14 kg/m2  Physical Examination: General: Well developed, well nourished, NAD HEENT: OP clear, mucus membranes moist SKIN: warm, dry. No rashes. Neuro: No focal deficits Musculoskeletal: Muscle strength 5/5 all ext Psychiatric: Mood and affect normal Neck: No JVD, no carotid bruits, no thyromegaly, no lymphadenopathy. Lungs:Clear bilaterally, no wheezes, rhonci, crackles Cardiovascular: Brady, No murmurs, gallops or rubs. Abdomen:Soft. Bowel sounds present. Non-tender.  Extremities: No lower extremity edema. Pulses are 2 + in the bilateral DP/PT.  EKG:  Cardiac cath: 04/17/11:  Left main: No obstructive disease.  Left Anterior Descending Artery: 100% ostial occlusion. AFter the vessel was opened, there is a 50% mid stenosis involving a large portion of the mid vessel.  Circumflex Artery: No obstructive disease. Mild plaque.  Right Coronary Artery: Moderate sized dominant vessel with mild plaque.  Left Ventricular Angiogram: LVEF 45%. There is hypokinesis of the anterior wall and apex.  Impression:  1. Acute anterior STEMI secondary to occluded proximal LAD  2. Successful thrombectomy and DES placement LAD  3. Mild segmental LV systolic dysfunction.   3.5 x 16 mm Promus Element DES x 1 ostial LAD, post-dilated with 3.75 x 12 mm [place] balloon.

## 2011-06-29 NOTE — Assessment & Plan Note (Signed)
Stable post MI. Will continue ASA and Brilinta for one year. Continue beta blocker, Ace-inh. Will restart lipitor at 20 mg po QHS. Continue heart healthy diet and daily exercise. I will see him back in 6 months.

## 2011-08-04 ENCOUNTER — Other Ambulatory Visit: Payer: Self-pay

## 2011-08-04 MED ORDER — TICAGRELOR 90 MG PO TABS: 90.0000 mg | ORAL_TABLET | Freq: Two times a day (BID) | ORAL | Status: DC

## 2011-08-13 ENCOUNTER — Other Ambulatory Visit: Payer: Self-pay | Admitting: Cardiovascular Disease

## 2011-08-13 DIAGNOSIS — E785 Hyperlipidemia, unspecified: Secondary | ICD-10-CM

## 2011-08-13 MED ORDER — ATORVASTATIN CALCIUM 20 MG PO TABS: 20.0000 mg | ORAL_TABLET | Freq: Every day | ORAL | Status: DC

## 2011-08-13 NOTE — Telephone Encounter (Signed)
Pt only has few pills left

## 2011-09-24 ENCOUNTER — Ambulatory Visit: Payer: PRIVATE HEALTH INSURANCE | Admitting: Internal Medicine

## 2011-09-30 ENCOUNTER — Encounter: Payer: Self-pay | Admitting: Internal Medicine

## 2011-09-30 ENCOUNTER — Ambulatory Visit (INDEPENDENT_AMBULATORY_CARE_PROVIDER_SITE_OTHER): Payer: PRIVATE HEALTH INSURANCE | Admitting: Internal Medicine

## 2011-09-30 VITALS — BP 116/76 | HR 59 | Temp 98.1°F | Wt 180.0 lb

## 2011-09-30 DIAGNOSIS — F3289 Other specified depressive episodes: Secondary | ICD-10-CM

## 2011-09-30 DIAGNOSIS — F329 Major depressive disorder, single episode, unspecified: Secondary | ICD-10-CM

## 2011-09-30 DIAGNOSIS — I2510 Atherosclerotic heart disease of native coronary artery without angina pectoris: Secondary | ICD-10-CM

## 2011-09-30 DIAGNOSIS — E109 Type 1 diabetes mellitus without complications: Secondary | ICD-10-CM

## 2011-09-30 DIAGNOSIS — E785 Hyperlipidemia, unspecified: Secondary | ICD-10-CM

## 2011-09-30 DIAGNOSIS — F17200 Nicotine dependence, unspecified, uncomplicated: Secondary | ICD-10-CM

## 2011-09-30 DIAGNOSIS — G4700 Insomnia, unspecified: Secondary | ICD-10-CM

## 2011-09-30 DIAGNOSIS — Z720 Tobacco use: Secondary | ICD-10-CM

## 2011-09-30 MED ORDER — ESCITALOPRAM OXALATE 10 MG PO TABS: 10.0000 mg | ORAL_TABLET | Freq: Every day | ORAL | Status: DC

## 2011-09-30 MED ORDER — CLONAZEPAM 0.5 MG PO TABS: 0.5000 mg | ORAL_TABLET | Freq: Every evening | ORAL | Status: DC | PRN

## 2011-09-30 NOTE — Assessment & Plan Note (Addendum)
Followup by endocrinology, last A1c few days ago ~ 7.0

## 2011-09-30 NOTE — Assessment & Plan Note (Signed)
Asymptomatic, will check lipids

## 2011-09-30 NOTE — Progress Notes (Signed)
  Subjective:    Patient ID: Nathaniel [name], male    DOB: [birth year], 51 y.o.   MRN: [id number]  HPI Routine office visit High cholesterol, tolerating Lipitor 20 mg very well. Depression, good compliance with medications, no apparent side effects. Symptoms well-controlled. Needs a refill of Lexapro and clonazepam CAD, see review of systems, he is asymptomatic, start exercise program few weeks ago and feel great. Diabetes, followup by endocrinology, last week his A1c was 7.0. Loose stools for the last few days, otherwise no GI complaints. See review of systems.  Past Medical History  Diagnosis Date  . Diabetes mellitus     dx at age 50, not overweight was told he was type 1  . Hyperlipemia   . Sleep apnea     has CPAP does not use  . Depression     has taken meds for depression before  . CAD (coronary artery disease)     a. ant stemi 04/2011 - totalled LAD treated w/ thrombectomy & 3.5 x 16 Promus DES.  OTW nonobs dzs.  EF 45%  . Ischemic cardiomyopathy     a. EF 45% by V-gram 04/2011;  b. Echo 04/19/2011 EF 55%, Gr 1 DD.  . Tobacco abuse    History   Social History  . Marital Status: Divorced    Spouse Name: N/A    Number of Children: 1  . Years of Education: N/A   Occupational History  . [employer]    Social History Main Topics  . Smoking status: Former Smoker    Types: Cigarettes    Quit date: 04/17/2011  . Smokeless tobacco: Never Used  . Alcohol Use: 3.0 - 3.6 oz/week    5-6 Shots of liquor per week     weekends only  . Drug Use: No  . Sexually Active: Not Currently   Other Topics Concern  . Not on file   Social History Narrative   Moved from [place] on July 2010---Regular exercise: started going to the gym ~ 09-2011-----Lives by himself----      Review of Systems No chest pain or shortness of breath No fever chills. No nausea, vomiting, no blood in the stools. Stools are not watery just soft. No abdominal pain. As far as smoking, he is still tobacco  free!     Objective:   Physical Exam general -- alert, well-developed, and overweight appearing. No apparent distress.  Lungs -- normal respiratory effort, no intercostal retractions, no accessory muscle use, and normal breath sounds.   Heart-- normal rate, regular rhythm, no murmur, and no gallop.   Abdomen--soft, non-tender, no distention, no masses, no HSM, no guarding, and no rigidity.   Extremities-- no pretibial edema bilaterally  Neurologic-- alert & oriented X3 and strength normal in all extremities. Psych-- Cognition and judgment appear intact. Alert and cooperative with normal attention span and concentration.  not anxious appearing and not depressed appearing.       Assessment & Plan:   Loose stools, ROS negative, exam benign. Recommend observation, will call if not improving in the next few days

## 2011-09-30 NOTE — Assessment & Plan Note (Signed)
Currently taking Lipitor 20 mg and tolerating well. Started going to the gym recently Plan:  Continue same medications, labs. Continue with his healthier lifestyle

## 2011-09-30 NOTE — Assessment & Plan Note (Addendum)
Well-controlled with Lexapro the present time. Plan: Refill clonazepam Continue Lexapro, reassess the need of medication in 4 months. See instructions.

## 2011-09-30 NOTE — Assessment & Plan Note (Signed)
Refill clonazepam

## 2011-09-30 NOTE — Assessment & Plan Note (Signed)
Currently tobacco free! Praised

## 2011-09-30 NOTE — Patient Instructions (Signed)
Come back fasting: FLP, AST, ALT--- dx  hyperlipidemia BMP--- dx  CAD ----- Come back in 4 months for a complete physical exam, fasting

## 2011-10-26 ENCOUNTER — Emergency Department (HOSPITAL_COMMUNITY): Payer: PRIVATE HEALTH INSURANCE

## 2011-10-26 ENCOUNTER — Observation Stay (HOSPITAL_COMMUNITY)
Admission: EM | Admit: 2011-10-26 | Discharge: 2011-10-27 | Disposition: A | Discharge status: Home / Self Care | Payer: PRIVATE HEALTH INSURANCE | Attending: Cardiology | Admitting: Cardiology

## 2011-10-26 ENCOUNTER — Telehealth: Payer: Self-pay | Admitting: Cardiovascular Disease

## 2011-10-26 ENCOUNTER — Encounter (HOSPITAL_COMMUNITY): Payer: Self-pay | Admitting: Emergency Medicine

## 2011-10-26 DIAGNOSIS — G4730 Sleep apnea, unspecified: Secondary | ICD-10-CM | POA: Insufficient documentation

## 2011-10-26 DIAGNOSIS — E785 Hyperlipidemia, unspecified: Secondary | ICD-10-CM

## 2011-10-26 DIAGNOSIS — F3289 Other specified depressive episodes: Secondary | ICD-10-CM | POA: Diagnosis present

## 2011-10-26 DIAGNOSIS — R0602 Shortness of breath: Secondary | ICD-10-CM | POA: Insufficient documentation

## 2011-10-26 DIAGNOSIS — I252 Old myocardial infarction: Secondary | ICD-10-CM | POA: Insufficient documentation

## 2011-10-26 DIAGNOSIS — R079 Chest pain, unspecified: Secondary | ICD-10-CM

## 2011-10-26 DIAGNOSIS — F17200 Nicotine dependence, unspecified, uncomplicated: Secondary | ICD-10-CM | POA: Insufficient documentation

## 2011-10-26 DIAGNOSIS — E109 Type 1 diabetes mellitus without complications: Secondary | ICD-10-CM

## 2011-10-26 DIAGNOSIS — Z9861 Coronary angioplasty status: Secondary | ICD-10-CM | POA: Insufficient documentation

## 2011-10-26 DIAGNOSIS — F329 Major depressive disorder, single episode, unspecified: Secondary | ICD-10-CM | POA: Diagnosis present

## 2011-10-26 DIAGNOSIS — E119 Type 2 diabetes mellitus without complications: Secondary | ICD-10-CM | POA: Insufficient documentation

## 2011-10-26 DIAGNOSIS — I200 Unstable angina: Secondary | ICD-10-CM | POA: Insufficient documentation

## 2011-10-26 DIAGNOSIS — I2510 Atherosclerotic heart disease of native coronary artery without angina pectoris: Principal | ICD-10-CM

## 2011-10-26 LAB — BASIC METABOLIC PANEL
GFR calc Af Amer: >90 mL/min (ref >90)
GFR calc non Af Amer: >90 mL/min (ref >90)
Potassium: 3.9 mEq/L (ref 3.5–5.1)
Sodium: 134 mEq/L — ABNORMAL LOW (ref 135–145)

## 2011-10-26 LAB — PROTIME-INR
INR: 1.04 (ref 0.00–1.49)
Prothrombin Time: 13.5 seconds (ref 11.6–15.2)

## 2011-10-26 LAB — CBC
Hemoglobin: 13.4 g/dL (ref 13.0–17.0)
MCHC: 35 g/dL (ref 30.0–36.0)
RDW: 13.1 % (ref 11.5–15.5)

## 2011-10-26 LAB — POCT I-STAT TROPONIN I: Troponin i, poc: 0 ng/mL (ref 0.00–0.08)

## 2011-10-26 MED ORDER — TICAGRELOR 90 MG PO TABS
90.0000 mg | ORAL_TABLET | Freq: Two times a day (BID) | ORAL | Status: DC
  Administered 2011-10-26 – 2011-10-27 (×2): 90 mg via ORAL
  Filled 2011-10-26 (×4): qty 1

## 2011-10-26 MED ORDER — ATORVASTATIN CALCIUM 20 MG PO TABS
20.0000 mg | ORAL_TABLET | Freq: Every day | ORAL | Status: DC
  Filled 2011-10-26 (×2): qty 1

## 2011-10-26 MED ORDER — CLONAZEPAM 0.5 MG PO TABS: 0.5000 mg | ORAL_TABLET | Freq: Every evening | ORAL | Status: DC | PRN

## 2011-10-26 MED ORDER — SODIUM CHLORIDE 0.9 % IJ SOLN
3.0000 mL | Freq: Two times a day (BID) | INTRAMUSCULAR | Status: DC
  Administered 2011-10-26: 3 mL via INTRAVENOUS

## 2011-10-26 MED ORDER — LISINOPRIL 2.5 MG PO TABS
2.5000 mg | ORAL_TABLET | Freq: Every day | ORAL | Status: DC
  Administered 2011-10-27: 2.5 mg via ORAL
  Filled 2011-10-26 (×2): qty 1

## 2011-10-26 MED ORDER — ASPIRIN 81 MG PO CHEW
81.0000 mg | CHEWABLE_TABLET | Freq: Every day | ORAL | Status: DC
  Administered 2011-10-27: 81 mg via ORAL
  Filled 2011-10-26: qty 1

## 2011-10-26 MED ORDER — NITROGLYCERIN 0.4 MG SL SUBL
0.4000 mg | SUBLINGUAL_TABLET | SUBLINGUAL | Status: DC | PRN
  Administered 2011-10-26: 0.4 mg via SUBLINGUAL
  Filled 2011-10-26: qty 25

## 2011-10-26 MED ORDER — FLUTICASONE FUROATE 27.5 MCG/SPRAY NA SUSP: 2.0000 | Freq: Every day | NASAL | Status: DC

## 2011-10-26 MED ORDER — NITROGLYCERIN 0.4 MG SL SUBL: 0.4000 mg | SUBLINGUAL_TABLET | SUBLINGUAL | Status: DC | PRN

## 2011-10-26 MED ORDER — CARVEDILOL 3.125 MG PO TABS
3.1250 mg | ORAL_TABLET | Freq: Two times a day (BID) | ORAL | Status: DC
  Administered 2011-10-27: 3.125 mg via ORAL
  Filled 2011-10-26 (×3): qty 1

## 2011-10-26 MED ORDER — SODIUM CHLORIDE 0.9 % IJ SOLN: 3.0000 mL | INTRAMUSCULAR | Status: DC | PRN

## 2011-10-26 MED ORDER — INSULIN PUMP
Freq: Three times a day (TID) | SUBCUTANEOUS | Status: DC
  Administered 2011-10-27: 2.75 via SUBCUTANEOUS
  Filled 2011-10-26: qty 1

## 2011-10-26 MED ORDER — ESCITALOPRAM OXALATE 10 MG PO TABS
10.0000 mg | ORAL_TABLET | Freq: Every day | ORAL | Status: DC
  Filled 2011-10-26 (×2): qty 1

## 2011-10-26 MED ORDER — ZOLPIDEM TARTRATE 5 MG PO TABS: 5.0000 mg | ORAL_TABLET | Freq: Every evening | ORAL | Status: DC | PRN

## 2011-10-26 MED ORDER — ONDANSETRON HCL 4 MG/2ML IJ SOLN: 4.0000 mg | Freq: Four times a day (QID) | INTRAMUSCULAR | Status: DC | PRN

## 2011-10-26 MED ORDER — FLUTICASONE PROPIONATE 50 MCG/ACT NA SUSP
1.0000 | Freq: Every day | NASAL | Status: DC
  Filled 2011-10-26: qty 16

## 2011-10-26 MED ORDER — SODIUM CHLORIDE 0.9 % IV SOLN: 250.0000 mL | INTRAVENOUS | Status: DC | PRN

## 2011-10-26 MED ORDER — ALPRAZOLAM 0.25 MG PO TABS: 0.2500 mg | ORAL_TABLET | Freq: Two times a day (BID) | ORAL | Status: DC | PRN

## 2011-10-26 MED ORDER — ACETAMINOPHEN 325 MG PO TABS: 650.0000 mg | ORAL_TABLET | ORAL | Status: DC | PRN

## 2011-10-26 NOTE — ED Provider Notes (Signed)
History     CSN: [id number]  Arrival date & time 10/26/11  1110   First MD Initiated Contact with Patient 10/26/11 1148      Chief Complaint  Patient presents with  . Chest Pain    (Consider location/radiation/quality/duration/timing/severity/associated sxs/prior treatment) HPI Comments: 51 year old male with a history of an MI and stent placement back in March of this year presents with midsternal chest pain that feels like heartburn after eating dinner last night. States he ate [place], however he eats spicy foods a lot. He describes the pain as an intermittent discomfort feeling which does not radiate. He is concerned because this is how he felt one week before his heart attack back in March. Admits to feeling fatigued. Denies any appetite change, nausea, vomiting, diaphoresis, lightheadedness or dizziness. He has not tried any alleviating factors for the heartburn/chest discomfort. He does have nitroglycerin, however he did not take it because he "knew he was not having a heart attack at this time".  Patient is a 51 y.o. male presenting with chest pain. The history is provided by the patient.  Chest Pain Primary symptoms include fatigue and shortness of breath. Pertinent negatives for primary symptoms include no fever, no cough, no abdominal pain, no nausea, no vomiting and no dizziness.  Pertinent negatives for associated symptoms include no diaphoresis and no weakness.     Past Medical History  Diagnosis Date  . Diabetes mellitus     dx at age 24, not overweight was told he was type 1  . Hyperlipemia   . Sleep apnea     has CPAP does not use  . Depression     has taken meds for depression before  . CAD (coronary artery disease)     a. ant stemi 04/2011 - totalled LAD treated w/ thrombectomy & 3.5 x 16 Promus DES.  OTW nonobs dzs.  EF 45%  . Ischemic cardiomyopathy     a. EF 45% by V-gram 04/2011;  b. Echo 04/19/2011 EF 55%, Gr 1 DD.  . Tobacco abuse     Past Surgical  History  Procedure Date  . Tonsillectomy   . Vasectomy     Family History  Problem Relation Age of Onset  . Multiple sclerosis Mother   . Parkinsonism Father   . Coronary artery disease Neg Hx   . Hypertension Neg Hx   . Diabetes Neg Hx   . Stroke Father   . Colon cancer Neg Hx   . Prostate cancer Neg Hx     History  Substance Use Topics  . Smoking status: Former Smoker    Types: Cigarettes    Quit date: 04/17/2011  . Smokeless tobacco: Never Used  . Alcohol Use: 3.0 - 3.6 oz/week    5-6 Shots of liquor per week     weekends only      Review of Systems  Constitutional: Positive for fatigue. Negative for fever, chills, diaphoresis and appetite change.  HENT: Negative for neck pain and neck stiffness.   Respiratory: Positive for shortness of breath. Negative for cough.   Cardiovascular: Positive for chest pain. Negative for leg swelling.  Gastrointestinal: Negative for nausea, vomiting and abdominal pain.  Musculoskeletal: Negative for back pain.  Skin: Negative for color change.  Neurological: Negative for dizziness, weakness and light-headedness.    Allergies  Review of patient's allergies indicates no known allergies.  Home Medications   Current Outpatient Rx  Name Route Sig Dispense Refill  . ASPIRIN 81 MG PO CHEW  Oral Chew 1 tablet (81 mg total) by mouth daily.    . ATORVASTATIN CALCIUM 20 MG PO TABS Oral Take 1 tablet (20 mg total) by mouth daily. 90 tablet 3  . CARVEDILOL 3.125 MG PO TABS Oral Take 1 tablet (3.125 mg total) by mouth 2 (two) times daily with a meal. 60 tablet 6  . CLONAZEPAM 0.5 MG PO TABS Oral Take 0.5 mg by mouth at bedtime as needed. For sleep    . ESCITALOPRAM OXALATE 10 MG PO TABS Oral Take 1 tablet (10 mg total) by mouth daily. 90 tablet 1  . FLUTICASONE FUROATE 27.5 MCG/SPRAY NA SUSP Nasal Place 2 sprays into the nose daily.    Marland Kitchen HUMALOG 100 UNIT/ML [place] SOLN  USE IN INSULIN PUMP AS DIRECTED BY PRESCRIBER 5 vial 1  . LISINOPRIL 2.5 MG  PO TABS  Take half tablet by mouth daily 45 tablet 1  . ONETOUCH ULTRA BLUE VI STRP  USE AS NEEDED 100 each 1  . ONETOUCH DELICA LANCETS MISC      . TICAGRELOR 90 MG PO TABS Oral Take 1 tablet (90 mg total) by mouth 2 (two) times daily. 60 tablet 6  . GLUCAGON EMERGENCY 1 MG IJ KIT      . NITROGLYCERIN 0.4 MG SL SUBL Sublingual Place 1 tablet (0.4 mg total) under the tongue every 5 (five) minutes x 3 doses as needed for chest pain. 25 tablet 3  . IMITREX PO Oral Take 1 tablet by mouth daily as needed. For migraines      BP 122/87  Pulse 55  Temp 97.8 F (36.6 C) (Oral)  Resp 18  SpO2 100%  Physical Exam  Constitutional: He is oriented to person, place, and time. He appears well-developed and well-nourished. No distress.  HENT:  Head: Normocephalic and atraumatic.  Mouth/Throat: Oropharynx is clear and moist.  Eyes: Conjunctivae normal are normal.  Neck: Normal range of motion. Neck supple. No JVD present.  Cardiovascular: Normal rate, regular rhythm, normal heart sounds and intact distal pulses.        No extremity edema  Pulmonary/Chest: Effort normal and breath sounds normal. He has no wheezes. He has no rales. He exhibits no tenderness.  Abdominal: Soft. Bowel sounds are normal. There is no tenderness.  Musculoskeletal: Normal range of motion. He exhibits no edema.  Neurological: He is alert and oriented to person, place, and time.  Skin: Skin is warm and dry. He is not diaphoretic.  Psychiatric: His speech is normal and behavior is normal.       Flat affect    ED Course  Procedures (including critical care time)   Labs Reviewed  POCT I-STAT TROPONIN I  CBC  BASIC METABOLIC PANEL  PROTIME-INR   Date: 10/26/2011  Rate: 53  Rhythm: sinus bradycardia  QRS Axis: normal  Intervals: normal  ST/T Wave abnormalities: normal  Conduction Disutrbances:none  Narrative Interpretation: no stemi- old septal infarct. EKG improved compared to EKG on 04/19/11.  Old EKG Reviewed:  changes noted   Dg Chest 2 View  10/26/2011  *RADIOLOGY REPORT*  Clinical Data: Heartburn.  Shortness of breath.  CHEST - 2 VIEW  Comparison: None.  Findings: The heart size is normal.  The lungs are clear.  The visualized soft tissues and bony thorax are unremarkable.  IMPRESSION: Negative chest.   Original Report Authenticated By: Jamesetta Orleans. MATTERN, M.D.      No diagnosis found.  12:56 PM Patient's chest pain has decreased since receiving nitroglycerine.  MDM  51 year old male with a history of an MI and stent placement in March presenting with chest pain. Pain relieved with nitroglycerin. Labs unremarkable, however due to patient's history, cardiology is coming to see the patient for admission. Case discussed with Dr. Preston Fleeting who agrees with plan of care.       Trevor Mace, PA-C 10/26/11 1510

## 2011-10-26 NOTE — Telephone Encounter (Signed)
Spoke with pt who reports he noticed heart burn after eating a small [place] lunch yesterday. Felt bloated. He reports feeling better when he went to bed. He woke this morning with heart burn and heavy breathing.  No chest pain.  States he normally does not get heartburn and states symptoms he is currently having are similar to symptoms he had prior to MI.  I instructed pt to call 911 to be transported to hospital. Pt states he does not want to call ambulance and will have coworker transport him to [facility].  Trish notified.

## 2011-10-26 NOTE — Consult Note (Signed)
CARDIOLOGY CONSULT NOTE  Patient ID: Nathaniel [name] MRN: [id number] DOB/AGE: [birth year] 51 y.o.  Admit date: 10/26/2011 Primary Physician [name], MD Primary Cardiologist Dr. Clifton  Chief Complaint  Chest pain  HPI:  The patient presents with chest discomfort. This started yesterday. He thought it might have been related to some [place] food he ate. The pain persisted most of yesterday. It was mild. It was mid sternal and somewhat reminiscent of his previous angina which she thought was heartburn. When he went to bed last night he thought his pain didn't resolve. He woke this morning feeling fatigued. He said sometime early in the morning his discomfort returned. He did not take any nitroglycerin. He did not have any radiation of discomfort to his jaw or arms. He did not have any associated nausea vomiting or diaphoresis. He had no palpitations, presyncope or syncope. He had been exercising though he recently hurt his back and hasn't done any significant aortic exercise in the last few days. He has a Sports coach and has had no problems with this. He's had no resting shortness of breath, PND or orthopnea.  Past Medical History  Diagnosis Date  . Diabetes mellitus     dx at age 24, not overweight was told he was type 1  . Hyperlipemia   . Sleep apnea     has CPAP does not use  . Depression     has taken meds for depression before  . CAD (coronary artery disease)     a. ant stemi 04/2011 - totalled LAD treated w/ thrombectomy & 3.5 x 16 Promus DES.  OTW nonobs dzs.  EF 45%  . Ischemic cardiomyopathy     a. EF 45% by V-gram 04/2011;  b. Echo 04/19/2011 EF 55%, Gr 1 DD.  . Tobacco abuse     reports he quit after STEMI    Past Surgical History  Procedure Date  . Tonsillectomy   . Vasectomy   . Cardiac cath with pci 04/17/2011    DES to LAD    No Known Allergies   Current Outpatient Prescriptions on File Prior to Encounter  Medication Sig Dispense Refill  . aspirin 81 MG chewable  tablet Chew 1 tablet (81 mg total) by mouth daily.      Marland Kitchen atorvastatin (LIPITOR) 20 MG tablet Take 1 tablet (20 mg total) by mouth daily.  90 tablet  3  . carvedilol (COREG) 3.125 MG tablet Take 1 tablet (3.125 mg total) by mouth 2 (two) times daily with a meal.  60 tablet  6  . escitalopram (LEXAPRO) 10 MG tablet Take 1 tablet (10 mg total) by mouth daily.  90 tablet  1  . fluticasone (VERAMYST) 27.5 MCG/SPRAY nasal spray Place 2 sprays into the nose daily.      Marland Kitchen HUMALOG 100 UNIT/ML injection USE IN INSULIN PUMP AS DIRECTED BY PRESCRIBER  5 vial  1  . lisinopril (PRINIVIL,ZESTRIL) 2.5 MG tablet Take half tablet by mouth daily  45 tablet  1  . ONE TOUCH ULTRA TEST test strip USE AS NEEDED  100 each  1  . ONETOUCH DELICA LANCETS MISC       . Ticagrelor (BRILINTA) 90 MG TABS tablet Take 1 tablet (90 mg total) by mouth 2 (two) times daily.  60 tablet  6  . GLUCAGON EMERGENCY 1 MG injection       . nitroGLYCERIN (NITROSTAT) 0.4 MG SL tablet Place 1 tablet (0.4 mg total) under the tongue every 5 (five) minutes x  3 doses as needed for chest pain.  25 tablet  3  . SUMAtriptan Succinate (IMITREX PO) Take 1 tablet by mouth daily as needed. For migraines        Family History  Problem Relation Age of Onset  . Multiple sclerosis Mother   . Parkinsonism Father   . Coronary artery disease Neg Hx   . Hypertension Neg Hx   . Diabetes Neg Hx   . Stroke Father   . Colon cancer Neg Hx   . Prostate cancer Neg Hx     History   Social History  . Marital Status: Divorced    Spouse Name: N/A    Number of Children: 1  . Years of Education: N/A   Occupational History  .    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 Smoking status: Former Smoker -- 0.5 packs/day for 15 years    Types: Cigarettes    Quit date: 04/17/2011  . Smokeless tobacco: Never Used  . Alcohol Use: 3.0 - 3.6 oz/week    5-6 Shots of liquor per week     weekends only  . Drug Use: No  . Sexually Active: Not Currently    Other Topics Concern  . Not on file   Social History Narrative   Moved from [place] on July 2010---Regular exercise: started going to the gym ~ 09-2011-----Lives by himself----      ROS:  As stated in the HPI and negative for all other systems.  Physical Exam: Blood pressure 130/80, pulse 61, temperature 97.8 F (36.6 C), temperature source Oral, resp. rate 16, SpO2 98.00%.  GENERAL:  Well appearing HEENT:  Pupils equal round and reactive, fundi not visualized, oral mucosa unremarkable NECK:  No jugular venous distention, waveform within normal limits, carotid upstroke brisk and symmetric, no bruits, no thyromegaly LYMPHATICS:  No cervical, inguinal adenopathy LUNGS:  Clear to auscultation bilaterally BACK:  No CVA tenderness CHEST:  Unremarkable HEART:  PMI not displaced or sustained,S1 and S2 within normal limits, no S3, no S4, no clicks, no rubs, no murmurs ABD:  Flat, positive bowel sounds normal in frequency in pitch, no bruits, no rebound, no guarding, no midline pulsatile mass, no hepatomegaly, no splenomegaly EXT:  2 plus pulses throughout, no edema, no cyanosis no clubbing SKIN:  No rashes no nodules NEURO:  Cranial nerves II through XII grossly intact, motor grossly intact throughout PSYCH:  Cognitively intact, oriented to person place and time  Labs: Lab Results  Component Value Date   BUN 15 10/26/2011   Lab Results  Component Value Date   CREATININE 0.97 10/26/2011   Lab Results  Component Value Date   NA 134* 10/26/2011   K 3.9 10/26/2011   CL 99 10/26/2011   CO2 25 10/26/2011    Lab Results  Component Value Date   WBC 4.9 10/26/2011   HGB 13.4 10/26/2011   HCT 38.3* 10/26/2011   MCV 96.5 10/26/2011   PLT 234 10/26/2011     Radiology:   CXR:  No acute disease.  EKG:   NSR, rate 53, axis WNL, no acute ST T wave changes.  10/26/2011   ASSESSMENT AND PLAN:    Chest pain -  His chest pain has atypical features. However, somewhat reminiscent of his  previous angina. He has significant risk factors and presented previously with a totally occluded LAD. At this point he'll be observed with enzymes cycled. Repeat an EKG in the morning.  If he has  no further discomfort, negative enzymes and normal EKG outpatient stress testing would be reasonable.  Diabetes - We will ontinue him on his current medications.  HTN - He will remain on the meds as listed.   Sleep apnea - He has not been able tolerate CPAP but is encouraged to followup with fitting of a new mask.  Tobacco abuse -  He quit smoking completely and I congratulated him on this.  SignedRollene Rotunda 10/26/2011, 5:05 PM

## 2011-10-26 NOTE — ED Notes (Signed)
Pt reports feeling of heartburn in midsternal area.  Pt states he is slightly sob and tired.  No other pain reported.  Pt has history of MI in march.  Pt alert oriented X4

## 2011-10-26 NOTE — ED Provider Notes (Addendum)
51 year old male who had a stent placed after STEMI in March of this year had heartburn last night and this morning. Heart rhythm is also the symptoms he had with his STEMI. Heartburn last night resolved and then recurred at about 9:30 AM today. In the ED, he got complete relief with sublingual nitroglycerin. He had some very mild associated dyspnea, but no nausea or diaphoresis. On exam, lungs are clear heart has regular rate and rhythm. Because of fairly recent stent placement, cardiology has been consulted. At the very least, he will need to be observed in the emergency department and have a troponin checked 6 hours after resolution of his pain.   Date: 10/26/2011  Rate: 53  Rhythm: sinus bradycardia  QRS Axis: normal  Intervals: normal  ST/T Wave abnormalities: normal  Conduction Disutrbances:none  Narrative Interpretation: Old anteroseptal myocardial infarction. When compared with prior ECG of 04/19/2011, there is been improvement in an anterolateral and lateral T-wave inversion.  Old EKG Reviewed: unchanged    Dione Booze, MD 10/26/11 1317  Patient has been seen by Dr. Antoine Poche who will admit the patient.  Dione Booze, MD 10/26/11 [phone]

## 2011-10-26 NOTE — ED Notes (Signed)
Pt c/o mid sternal CP with SOB that feels like heart burn and the same as when had MI in March

## 2011-10-26 NOTE — Telephone Encounter (Signed)
Agree. cdm 

## 2011-10-26 NOTE — Telephone Encounter (Signed)
plz return call to patient [phone], c/o Labored breathing, fatigue, no Chest pain and heart burn like symptoms since yesterday.

## 2011-10-27 DIAGNOSIS — I200 Unstable angina: Secondary | ICD-10-CM

## 2011-10-27 LAB — HEMOGLOBIN A1C
Hgb A1c MFr Bld: 6.9 % — ABNORMAL HIGH (ref <5.7)
Mean Plasma Glucose: 151 mg/dL — ABNORMAL HIGH (ref <117)

## 2011-10-27 LAB — LIPID PANEL
Cholesterol: 150 mg/dL (ref 0–200)
HDL: 82 mg/dL (ref >39)
Triglycerides: 51 mg/dL (ref <150)

## 2011-10-27 LAB — GLUCOSE, CAPILLARY: Glucose-Capillary: 110 mg/dL — ABNORMAL HIGH (ref 70–99)

## 2011-10-27 NOTE — Discharge Summary (Signed)
See full note this am. cdm 

## 2011-10-27 NOTE — Progress Notes (Signed)
Utilization review complete 

## 2011-10-27 NOTE — Progress Notes (Signed)
Pt. Refused CPAP at this time and does not wish to wear during his hospital stay.

## 2011-10-27 NOTE — Progress Notes (Signed)
Discharge instructions given to pt. Pt verbalized understanding. Pt ready for discharge and is medically stable.

## 2011-10-27 NOTE — ED Provider Notes (Signed)
Medical screening examination/treatment/procedure(s) were conducted as a shared visit with non-physician practitioner(s) and myself.  I personally evaluated the patient during the encounter   Blayre Papania, MD 10/27/11 0846 

## 2011-10-27 NOTE — Progress Notes (Signed)
    SUBJECTIVE: No recurrent chest pain overnight or this am. No events.   BP 99/63  Pulse 57  Temp 98 F (36.7 C) (Oral)  Resp 18  Ht 5\' 9"  (1.753 m)  Wt 176 lb 9.4 oz (80.1 kg)  BMI 26.08 kg/m2  SpO2 99% No intake or output data in the 24 hours ending 10/27/11 0710  PHYSICAL EXAM General: Well developed, well nourished, in no acute distress. Alert and oriented x 3.  Psych:  Good affect, responds appropriately Neck: No JVD. No masses noted.  Lungs: Clear bilaterally with no wheezes or rhonci noted.  Heart: Huston Foley with no murmurs noted. Abdomen: Bowel sounds are present. Soft, non-tender.  Extremities: No lower extremity edema.   LABS: Basic Metabolic Panel:  Basename 10/26/11 1133  NA 134*  K 3.9  CL 99  CO2 25  GLUCOSE 131*  BUN 15  CREATININE 0.97  CALCIUM 9.7  MG --  PHOS --   CBC:  Basename 10/26/11 1133  WBC 4.9  NEUTROABS --  HGB 13.4  HCT 38.3*  MCV 96.5  PLT 234   Cardiac Enzymes:  Basename 10/27/11 0544 10/27/11 0004 10/26/11 1857  CKTOTAL -- -- --  CKMB -- -- --  CKMBINDEX -- -- --  TROPONINI <0.30 <0.30 <0.30   Fasting Lipid Panel:  Basename 10/27/11 0544  CHOL 150  HDL 82  LDLCALC 58  TRIG 51  CHOLHDL 1.8  LDLDIRECT --    Current Meds:    . aspirin  81 mg Oral Daily  . atorvastatin  20 mg Oral Daily  . carvedilol  3.125 mg Oral BID WC  . escitalopram  10 mg Oral Daily  . fluticasone  1 spray Each Nare Daily  . insulin pump   Subcutaneous TID AC, HS, 0200  . lisinopril  2.5 mg Oral Daily  . sodium chloride  3 mL Intravenous Q12H  . Ticagrelor  90 mg Oral BID  . DISCONTD: fluticasone  2 spray Nasal Daily     ASSESSMENT AND PLAN:  1. CAD: Pt has history of CAD with anterior STEMI in March 2013 treated with DES x 1 ostium of LAD. He had moderate disease noted in the mid LAD and mild disease in the Circumflex and RCA. No current evidence of ischemia.   2. Chest pain: His chest pain is atypical. No recurrent chest pain.  Cardiac markers are negative. EKG without ischemia on admission. Repeat EKG pending this am. If EKG is ok, will plan d/c home and arrange exercise stress myoview in the office in next 1-2 weeks.   3. Diabetes mellitus: Home meds.  4. HTN: BP well controlled.   5. Sleep apnea: He has not been able tolerate CPAP but is encouraged to followup with fitting of a new mask.   6. Dispo: D/C home today and plan f/u stress myoview then f/u with me after myoview. No med changes.      MCALHANY,CHRISTOPHER  9/18/20137:10 AM

## 2011-10-27 NOTE — Discharge Summary (Signed)
Patient ID: Nathaniel [name],  MRN: [id number], DOB/AGE: [birth year] 51 y.o.  Admit date: 10/26/2011 Discharge date: 10/27/2011  Primary Care Provider: Tenna Child, MD Primary Cardiologist: C. Clifton James, MD  Discharge Diagnoses Principal Problem:  *Unstable angina Active Problems:  CAD (coronary artery disease)  **s/p prior anterior STEMI with DES to the ostial LAD in 04/2011.  DIABETES MELLITUS, TYPE I  **On insulin pump  HYPERLIPIDEMIA  DEPRESSION  Allergies No Known Allergies  Procedures  None  History of Present Illness  51 y/o male with h/o CAD s/p anterior STEMI and drug-eluting stent placement to the LAD in March 2013 who was in his USOH until the day prior to admission when after eating [place] food, he developed mid-sternal chest discomfort reminiscent of prior angina.  The pain persisted the remainder of the evening and was present at bedtime.  On the morning of admission, he felt fatigued and chest discomfort returned.  As a result, he presented to the [facility] ED where ECG was non-acute and initial cardiac markers were negative.  He was admitted for further evaluation.  Hospital Course  Pt r/o for MI.  He had no recurrence of chest pain.  He has been ambulating this morning without difficulty and we have arranged for an outpatient myoview early next week.  He will be discharged home today in good condition.  Discharge Vitals Blood pressure 129/83, pulse 64, temperature 98 F (36.7 C), temperature source Oral, resp. rate 18, height 5\' 9"  (1.753 m), weight 176 lb 9.4 oz (80.1 kg), SpO2 99.00%.  Filed Weights   10/26/11 1840 10/27/11 0630  Weight: 180 lb 12.4 oz (82 kg) 176 lb 9.4 oz (80.1 kg)    Labs  CBC  Basename 10/26/11 1133  WBC 4.9  NEUTROABS --  HGB 13.4  HCT 38.3*  MCV 96.5  PLT 234   Basic Metabolic Panel  Basename 10/26/11 1133  NA 134*  K 3.9  CL 99  CO2 25  GLUCOSE 131*  BUN 15  CREATININE 0.97  CALCIUM 9.7  MG --  PHOS --   Cardiac  Enzymes  Basename 10/27/11 0544 10/27/11 0004 10/26/11 1857  CKTOTAL -- -- --  CKMB -- -- --  CKMBINDEX -- -- --  TROPONINI <0.30 <0.30 <0.30   Fasting Lipid Panel  Basename 10/27/11 0544  CHOL 150  HDL 82  LDLCALC 58  TRIG 51  CHOLHDL 1.8  LDLDIRECT --   Disposition  Pt is being discharged home today in good condition.  Follow-up Plans & Appointments  Follow-up Information    Follow up with [facility]. On 11/02/2011. (9:15 AM - Nothing to eat or drink after midnight.  Wear comfortable shoes/clothing.  Do not take your carvedilol on this AM.)    Contact information:   [address] [place] [phone]      Follow up with Verne Carrow, MD. On 11/19/2011. (4:15 PM)    Contact information:   [facility] [address] [place] [address] [phone]        Discharge Medications    Medication List     As of 10/27/2011 10:06 AM    TAKE these medications         aspirin 81 MG chewable tablet   Chew 1 tablet (81 mg total) by mouth daily.      atorvastatin 20 MG tablet   Commonly known as: LIPITOR   Take 1 tablet (20 mg total) by mouth daily.      carvedilol  3.125 MG tablet   Commonly known as: COREG   Take 1 tablet (3.125 mg total) by mouth 2 (two) times daily with a meal.      clonazePAM 0.5 MG tablet   Commonly known as: KLONOPIN   Take 0.5 mg by mouth at bedtime as needed. For sleep      escitalopram 10 MG tablet   Commonly known as: LEXAPRO   Take 1 tablet (10 mg total) by mouth daily.      fluticasone 27.5 MCG/SPRAY nasal spray   Commonly known as: VERAMYST   Place 2 sprays into the nose daily.      GLUCAGON EMERGENCY 1 MG injection   Generic drug: glucagon      HUMALOG 100 UNIT/ML injection   Generic drug: insulin lispro   USE IN INSULIN PUMP AS DIRECTED BY PRESCRIBER      IMITREX PO   Take 1 tablet by mouth daily as needed. For migraines      lisinopril 2.5 MG tablet   Commonly known as:  PRINIVIL,ZESTRIL   Take half tablet by mouth daily      nitroGLYCERIN 0.4 MG SL tablet   Commonly known as: NITROSTAT   Place 1 tablet (0.4 mg total) under the tongue every 5 (five) minutes x 3 doses as needed for chest pain.      ONE TOUCH ULTRA TEST test strip   Generic drug: glucose blood   USE AS NEEDED      ONETOUCH DELICA LANCETS Misc      Ticagrelor 90 MG Tabs tablet   Commonly known as: BRILINTA   Take 1 tablet (90 mg total) by mouth 2 (two) times daily.      Outstanding Labs/Studies  Ex Myoview scheduled for next week.  Duration of Discharge Encounter   Greater than 30 minutes including physician time.  Signed, Nicolasa Ducking NP 10/27/2011, 10:06 AM

## 2011-11-02 ENCOUNTER — Ambulatory Visit (HOSPITAL_COMMUNITY): Payer: PRIVATE HEALTH INSURANCE | Attending: Cardiology | Admitting: Radiology

## 2011-11-02 VITALS — BP 98/66 | Ht 69.0 in | Wt 177.0 lb

## 2011-11-02 DIAGNOSIS — E109 Type 1 diabetes mellitus without complications: Secondary | ICD-10-CM | POA: Insufficient documentation

## 2011-11-02 DIAGNOSIS — I2510 Atherosclerotic heart disease of native coronary artery without angina pectoris: Secondary | ICD-10-CM | POA: Insufficient documentation

## 2011-11-02 DIAGNOSIS — I10 Essential (primary) hypertension: Secondary | ICD-10-CM | POA: Insufficient documentation

## 2011-11-02 DIAGNOSIS — R079 Chest pain, unspecified: Secondary | ICD-10-CM | POA: Insufficient documentation

## 2011-11-02 MED ORDER — TECHNETIUM TC 99M SESTAMIBI GENERIC - CARDIOLITE
30.0000 | Freq: Once | INTRAVENOUS | Status: AC | PRN
  Administered 2011-11-02: 30 via INTRAVENOUS

## 2011-11-02 MED ORDER — TECHNETIUM TC 99M SESTAMIBI GENERIC - CARDIOLITE
10.0000 | Freq: Once | INTRAVENOUS | Status: AC | PRN
  Administered 2011-11-02: 10 via INTRAVENOUS

## 2011-11-02 NOTE — Progress Notes (Signed)
[facility] SITE 3 NUCLEAR MED [address] [place] [address] [phone]  Cardiology Nuclear Med Study  Nathaniel [name] is a 51 y.o. male     MRN : [id number]     DOB: 1960/12/02  Procedure Date: 11/02/2011  Nuclear Med Background Indication for Stress Test:  Evaluation for Ischemia and Stent Patency History:  ICM, 03/13 MI-STEMI AWMI, Heart Cath: EF: 45% Angioplasty LAD Stents: ostial LAD with residual N/O CAD  Cardiac Risk Factors: History of Smoking, Hypertension, IDDM Type 1 and Lipids  Symptoms:  Chest Pain   Nuclear Pre-Procedure Caffeine/Decaff Intake:  None > 12 hrs NPO After: 7:00pm   Lungs:  clear O2 Sat: 98% on room air. IV 0.9% NS with Angio Cath:  22g  IV Site: R Antecubital x 1, tolerated well IV Started by:  Irean Hong, RN  Chest Size (in):  44 Cup Size: n/a  Height: 5\' 9"  (1.753 m)  Weight:  177 lb (80.287 kg)  BMI:  Body mass index is 26.14 kg/(m^2). Tech Comments:  Held coreg x 24 hrs. FBS at 6:30am was 79 per patient, and  FBS at 9:22 am was 83. Patient has an insulin pump, no insulin bolus today    Nuclear Med Study 1 or 2 day study: 1 day  Stress Test Type:  Stress  Reading MD: Olga Millers, MD  Order Authorizing Provider:  Verne Carrow, MD  Resting Radionuclide: Technetium 31m Sestamibi  Resting Radionuclide Dose: 11.0 mCi   Stress Radionuclide:  Technetium 77m Sestamibi  Stress Radionuclide Dose: 32.9 mCi           Stress Protocol Rest HR: 59 Stress HR: 155  Rest BP: 98/66 Stress BP: 156/71  Exercise Time (min): 8:00 METS: 10.10   Predicted Max HR: 169 bpm % Max HR: 91.72 bpm Rate Pressure Product: 46962   Dose of Adenosine (mg):  n/a Dose of Lexiscan: n/a mg  Dose of Atropine (mg): n/a Dose of Dobutamine: n/a mcg/kg/min (at max HR)  Stress Test Technologist: Milana Na, EMT-P  Nuclear Technologist:  Domenic Polite, CNMT     Rest Procedure:  Myocardial perfusion imaging was performed at rest 45  minutes following the intravenous administration of Technetium 29m Sestamibi. Rest ECG: NSR - Normal EKG  Stress Procedure:  The patient performed treadmill exercise using a Bruce  Protocol for 8:00 minutes. The patient stopped due to fatigue, doe and denied any chest pain.  There were no significant ST-T wave changes and a rare pvc.  Technetium 35m Sestamibi was injected at peak exercise and myocardial perfusion imaging was performed after a brief delay. Stress ECG: No significant change from baseline ECG  QPS Raw Data Images:  Acquisition technically good; normal left ventricular size. Stress Images:  There is decreased uptake in the anterior wall and apex. Rest Images:  There is decreased uptake in the anterior wall and apex, less prominent compared to the stress images. Subtraction (SDS):  These findings are consistent with prior anterior and apical infarct and mild peri-infarct ischemia. Transient Ischemic Dilatation (Normal <1.22):  1.01 Lung/Heart Ratio (Normal <0.45):  0.39  Quantitative Gated Spect Images QGS EDV:  99 ml QGS ESV:  44 ml  Impression Exercise Capacity:  Good exercise capacity. BP Response:  Normal blood pressure response. Clinical Symptoms:  There is dyspnea. ECG Impression:  No significant ST segment change suggestive of ischemia. Comparison with Prior Nuclear Study: No previous nuclear study performed  Overall Impression:  Abnormal stress nuclear study with a  medium size, moderate intensity, partially reversible anterior and apical defect consistent with prior infarct and mild peri-infarct ischemia.  LV Ejection Fraction: 56%.  LV Wall Motion:  Mild apical hypokinesis.  Olga Millers

## 2011-11-04 ENCOUNTER — Encounter: Payer: Self-pay | Admitting: *Deleted

## 2011-11-04 ENCOUNTER — Ambulatory Visit (INDEPENDENT_AMBULATORY_CARE_PROVIDER_SITE_OTHER): Payer: PRIVATE HEALTH INSURANCE | Admitting: Cardiovascular Disease

## 2011-11-04 ENCOUNTER — Encounter: Payer: Self-pay | Admitting: Cardiovascular Disease

## 2011-11-04 VITALS — BP 129/80 | HR 56 | Ht 69.0 in | Wt 181.0 lb

## 2011-11-04 DIAGNOSIS — R9430 Abnormal result of cardiovascular function study, unspecified: Secondary | ICD-10-CM

## 2011-11-04 DIAGNOSIS — I2510 Atherosclerotic heart disease of native coronary artery without angina pectoris: Secondary | ICD-10-CM

## 2011-11-04 DIAGNOSIS — R079 Chest pain, unspecified: Secondary | ICD-10-CM

## 2011-11-04 LAB — CBC WITH DIFFERENTIAL/PLATELET
Basophils Relative: 1 % (ref 0.0–3.0)
Eosinophils Absolute: 0.2 10*3/uL (ref 0.0–0.7)
Eosinophils Relative: 3.2 % (ref 0.0–5.0)
HCT: 38.7 % — ABNORMAL LOW (ref 39.0–52.0)
Lymphs Abs: 1.3 10*3/uL (ref 0.7–4.0)
MCHC: 32.6 g/dL (ref 30.0–36.0)
MCV: 100.6 fl — ABNORMAL HIGH (ref 78.0–100.0)
Monocytes Absolute: 0.6 10*3/uL (ref 0.1–1.0)
Platelets: 213 10*3/uL (ref 150.0–400.0)
RBC: 3.85 Mil/uL — ABNORMAL LOW (ref 4.22–5.81)
WBC: 5.5 10*3/uL (ref 4.5–10.5)

## 2011-11-04 LAB — BASIC METABOLIC PANEL
BUN: 18 mg/dL (ref 6–23)
CO2: 27 mEq/L (ref 19–32)
Chloride: 102 mEq/L (ref 96–112)
Potassium: 4.2 mEq/L (ref 3.5–5.1)

## 2011-11-04 NOTE — Progress Notes (Signed)
History of Present Illness: 51 y.o. male with history of DM, HTN, OSA, tobacco abuse, OSA and CAD with recent anterior STEMI who is here today for cardiac follow up. He was admitted 3/9-3/11/13 with an anterior STEMI. Emergent LHC 04/17/11: Ostial LAD 100% occluded, mid 50%, mild plaque in the circumflex and RCA, anterior wall and apical hypokinesis with an EF of 45%. PCI: Promus DES to the ostial LAD. He was placed on aspirin and Ticagrelor. He was admitted to [facility] 10/26/11 with chest pain and ruled out for MI. Chest pain was atypical. Outpatient myoview was arranged and showed anterior and apical defect d/w scar and peri-infarct ischemia.   He is here today for follow up. He has been doing well. The patient denies chest pain, shortness of breath, syncope, orthopnea, PND or significant pedal edema. He did not tolerate 80 mg of Lipitor. This made him feel fatigued. He stopped the Lipitor and had his fatigue improve. He has stopped smoking completely. He has been walking Every day. He tolerated Lipitor 20 mg for many years before his MI.  Primary Care Physician: [facility]   Primary Care Physician:  Last Lipid Profile:Lipid Panel     Component Value Date/Time   CHOL 150 10/27/2011 0544   TRIG 51 10/27/2011 0544   HDL 82 10/27/2011 0544   CHOLHDL 1.8 10/27/2011 0544   VLDL 10 10/27/2011 0544   LDLCALC 58 10/27/2011 0544     Past Medical History  Diagnosis Date  . Diabetes mellitus     dx at age 20, not overweight was told he was type 1  . Hyperlipemia   . Sleep apnea     has CPAP does not use  . Depression     has taken meds for depression before  . CAD (coronary artery disease)     a. ant stemi 04/2011 - totalled LAD treated w/ thrombectomy & 3.5 x 16 Promus DES.  OTW nonobs dzs.  EF 45%  . Ischemic cardiomyopathy     a. EF 45% by V-gram 04/2011;  b. Echo 04/19/2011 EF 55%, Gr 1 DD.  . Tobacco abuse     reports he quit after STEMI    Past Surgical History  Procedure Date  . Tonsillectomy   .  Vasectomy   . Cardiac cath with pci 04/17/2011    DES to LAD    Current Outpatient Prescriptions  Medication Sig Dispense Refill  . aspirin 81 MG chewable tablet Chew 1 tablet (81 mg total) by mouth daily.      Marland Kitchen atorvastatin (LIPITOR) 20 MG tablet Take 1 tablet (20 mg total) by mouth daily.  90 tablet  3  . carvedilol (COREG) 3.125 MG tablet Take 1 tablet (3.125 mg total) by mouth 2 (two) times daily with a meal.  60 tablet  6  . clonazePAM (KLONOPIN) 0.5 MG tablet Take 0.5 mg by mouth at bedtime as needed. For sleep      . fluticasone (VERAMYST) 27.5 MCG/SPRAY nasal spray Place 2 sprays into the nose daily.      Marland Kitchen GLUCAGON EMERGENCY 1 MG injection       . HUMALOG 100 UNIT/ML injection USE IN INSULIN PUMP AS DIRECTED BY PRESCRIBER  5 vial  1  . lisinopril (PRINIVIL,ZESTRIL) 2.5 MG tablet Take half tablet by mouth daily  45 tablet  1  . nitroGLYCERIN (NITROSTAT) 0.4 MG SL tablet Place 1 tablet (0.4 mg total) under the tongue every 5 (five) minutes x 3 doses as needed for chest  pain.  25 tablet  3  . ONE TOUCH ULTRA TEST test strip USE AS NEEDED  100 each  1  . ONETOUCH DELICA LANCETS MISC       . SUMAtriptan Succinate (IMITREX PO) Take 1 tablet by mouth daily as needed. For migraines      . Ticagrelor (BRILINTA) 90 MG TABS tablet Take 1 tablet (90 mg total) by mouth 2 (two) times daily.  60 tablet  6  . escitalopram (LEXAPRO) 10 MG tablet Take 1 tablet (10 mg total) by mouth daily.  90 tablet  1  . DISCONTD: SUMAtriptan (IMITREX) 20 MG/ACT nasal spray Place 1 spray into the nose every 2 (two) hours as needed. For cluster headaches. No more than 2 sprays per day        No Known Allergies  History   Social History  . Marital Status: Divorced    Spouse Name: N/A    Number of Children: 1  . Years of Education: N/A   Occupational History  .    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 Smoking status: Former Smoker -- 0.5 packs/day for 15 years    Types: Cigarettes    Quit  date: 04/17/2011  . Smokeless tobacco: Never Used  . Alcohol Use: 3.0 - 3.6 oz/week    5-6 Shots of liquor per week     weekends only  . Drug Use: No  . Sexually Active: Not Currently   Other Topics Concern  . Not on file   Social History Narrative   Moved from [place] on July 2010---Regular exercise: started going to the gym ~ 09-2011-----Lives by himself----     Family History  Problem Relation Age of Onset  . Multiple sclerosis Mother   . Parkinsonism Father   . Coronary artery disease Neg Hx   . Hypertension Neg Hx   . Diabetes Neg Hx   . Stroke Father   . Colon cancer Neg Hx   . Prostate cancer Neg Hx     Review of Systems:  As stated in the HPI and otherwise negative.   BP 129/80  Pulse 56  Ht 5\' 9"  (1.753 m)  Wt 181 lb (82.101 kg)  BMI 26.73 kg/m2  Physical Examination: General: Well developed, well nourished, NAD HEENT: OP clear, mucus membranes moist SKIN: warm, dry. No rashes. Neuro: No focal deficits Musculoskeletal: Muscle strength 5/5 all ext Psychiatric: Mood and affect normal Neck: No JVD, no carotid bruits, no thyromegaly, no lymphadenopathy. Lungs:Clear bilaterally, no wheezes, rhonci, crackles Cardiovascular: Regular rate and rhythm. No murmurs, gallops or rubs. Abdomen:Soft. Bowel sounds present. Non-tender.  Extremities: No lower extremity edema. Pulses are 2 + in the bilateral DP/PT.  Exercise Stress Myoview 11/02/11:  Stress Procedure: The patient performed treadmill exercise using a Bruce Protocol for 8:00 minutes. The patient stopped due to fatigue, doe and denied any chest pain. There were no significant ST-T wave changes and a rare pvc. Technetium 39m Sestamibi was injected at peak exercise and myocardial perfusion imaging was performed after a brief delay.  Stress ECG: No significant change from baseline ECG  QPS  Raw Data Images: Acquisition technically good; normal left ventricular size.  Stress Images: There is decreased uptake in the  anterior wall and apex.  Rest Images: There is decreased uptake in the anterior wall and apex, less prominent compared to the stress images.  Subtraction (SDS): These findings are consistent with prior anterior and apical infarct and mild peri-infarct ischemia.  Transient Ischemic Dilatation (Normal <1.22): 1.01  Lung/Heart Ratio (Normal <0.45): 0.39  Quantitative Gated Spect Images  QGS EDV: 99 ml  QGS ESV: 44 ml  Impression  Exercise Capacity: Good exercise capacity.  BP Response: Normal blood pressure response.  Clinical Symptoms: There is dyspnea.  ECG Impression: No significant ST segment change suggestive of ischemia.  Comparison with Prior Nuclear Study: No previous nuclear study performed  Overall Impression: Abnormal stress nuclear study with a medium size, moderate intensity, partially reversible anterior and apical defect consistent with prior infarct and mild peri-infarct ischemia.  LV Ejection Fraction: 56%. LV Wall Motion: Mild apical hypokinesis.   Assessment and Plan:   1. CAD: Known CAD with anterior STEMI March 2013. Recent chest pain and stress test with possible anterior and apical ischemia in area of infarct. Will arrange left heart catheterization with possible PCI in outpatient cath lab 11/10/11. Pre-cath labs today. R/B reviewed. Minimal exertion until time of cath.

## 2011-11-04 NOTE — Patient Instructions (Addendum)
Your physician recommends that you schedule a follow-up appointment in:  4-5 weeks.   Your physician has requested that you have a cardiac catheterization. Cardiac catheterization is used to diagnose and/or treat various heart conditions. Doctors may recommend this procedure for a number of different reasons. The most common reason is to evaluate chest pain. Chest pain can be a symptom of coronary artery disease (CAD), and cardiac catheterization can show whether plaque is narrowing or blocking your heart's arteries. This procedure is also used to evaluate the valves, as well as measure the blood flow and oxygen levels in different parts of your heart. For further information please visit [email or web address] Please follow instruction sheet, as given. Scheduled for November 10, 2011

## 2011-11-10 ENCOUNTER — Inpatient Hospital Stay (HOSPITAL_BASED_OUTPATIENT_CLINIC_OR_DEPARTMENT_OTHER)
Admission: RE | Admit: 2011-11-10 | Discharge: 2011-11-10 | Disposition: A | Discharge status: Home / Self Care | Payer: PRIVATE HEALTH INSURANCE | Source: Ambulatory Visit | Attending: Cardiovascular Disease | Admitting: Cardiovascular Disease

## 2011-11-10 ENCOUNTER — Encounter (HOSPITAL_BASED_OUTPATIENT_CLINIC_OR_DEPARTMENT_OTHER)
Admission: RE | Disposition: A | Discharge status: Home / Self Care | Payer: Self-pay | Source: Ambulatory Visit | Attending: Cardiovascular Disease

## 2011-11-10 DIAGNOSIS — I2510 Atherosclerotic heart disease of native coronary artery without angina pectoris: Secondary | ICD-10-CM | POA: Insufficient documentation

## 2011-11-10 DIAGNOSIS — I252 Old myocardial infarction: Secondary | ICD-10-CM | POA: Insufficient documentation

## 2011-11-10 DIAGNOSIS — Z9861 Coronary angioplasty status: Secondary | ICD-10-CM | POA: Insufficient documentation

## 2011-11-10 DIAGNOSIS — R0789 Other chest pain: Secondary | ICD-10-CM | POA: Insufficient documentation

## 2011-11-10 LAB — POCT I-STAT GLUCOSE
Glucose, Bld: 151 mg/dL — ABNORMAL HIGH (ref 70–99)
Operator id: 184681

## 2011-11-10 SURGERY — JV LEFT HEART CATHETERIZATION WITH CORONARY ANGIOGRAM
Anesthesia: Moderate Sedation

## 2011-11-10 MED ORDER — SODIUM CHLORIDE 0.9 % IJ SOLN: 3.0000 mL | INTRAMUSCULAR | Status: DC | PRN

## 2011-11-10 MED ORDER — SODIUM CHLORIDE 0.9 % IJ SOLN: 3.0000 mL | Freq: Two times a day (BID) | INTRAMUSCULAR | Status: DC

## 2011-11-10 MED ORDER — SODIUM CHLORIDE 0.9 % IV SOLN
INTRAVENOUS | Status: DC
  Administered 2011-11-10: 08:00:00 via INTRAVENOUS

## 2011-11-10 MED ORDER — SODIUM CHLORIDE 0.9 % IV SOLN
250.0000 mL | INTRAVENOUS | Status: DC | PRN
Start: 2011-11-10 — End: 2011-11-10

## 2011-11-10 MED ORDER — DIAZEPAM 5 MG PO TABS
5.0000 mg | ORAL_TABLET | ORAL | Status: AC
  Administered 2011-11-10: 5 mg via ORAL

## 2011-11-10 MED ORDER — SODIUM CHLORIDE 0.9 % IV SOLN: INTRAVENOUS | Status: AC

## 2011-11-10 MED ORDER — ACETAMINOPHEN 325 MG PO TABS: 650.0000 mg | ORAL_TABLET | ORAL | Status: DC | PRN

## 2011-11-10 MED ORDER — ASPIRIN 81 MG PO CHEW
324.0000 mg | CHEWABLE_TABLET | ORAL | Status: AC
  Administered 2011-11-10: 324 mg via ORAL

## 2011-11-10 NOTE — CV Procedure (Signed)
    Cardiac Catheterization Operative Report  Prabhav Faulkenberry [id number] 10/2/20139:43 AM [name], MD  Procedure Performed:  1. Left Heart Catheterization 2. Selective Coronary Angiography 3. Left ventricular angiogram  Operator: Verne Carrow, MD  Arterial access site:  Right radial artery.   Indication:   Pt has history of CAD with anterior STEMI March 2013 secondary to occluded LAD. He had a single drug eluting stent placed in the ostium of the LAD in March 2013. He has had recent episodes of chest pain and stress myoview with anterior wall and apical scar and possible ischemia.                                     Procedure Details: The risks, benefits, complications, treatment options, and expected outcomes were discussed with the patient. The patient and/or family concurred with the proposed plan, giving informed consent. The patient was brought to the cath lab after IV hydration was begun and oral premedication was given. The patient was further sedated with Versed and Fentanyl. The right wrist was assessed with an Allens test which was positive. The right wrist was prepped and draped in a sterile fashion. 1% lidocaine was used for local anesthesia. Using the modified Seldinger access technique, a 5 French sheath was placed in the right radial artery. 3 mg Verapamil was given through the sheath. 4000 units IV heparin was given. Standard diagnostic catheters were used to perform selective coronary angiography. A pigtail catheter was used to perform a left ventricular angiogram. The sheath was removed from the right radial artery and a Terumo hemostasis band was applied at the arteriotomy site on the right wrist.    There were no immediate complications. The patient was taken to the recovery area in stable condition.   Hemodynamic Findings: Central aortic pressure: 88/55 Left ventricular pressure: 89/10/12  Angiographic Findings:  Left main: 10% plaque proximal.   Left  Anterior Descending Artery: Large caliber vessel that courses to the apex. There is a patent stent extending from the ostium down into the proximal vessel. The stent is widely patent with no restenosis noted. The mid vessel has a long segment of 40% stenosis that is unchanged from prior cath. There are 3 very small diagonal branches with no disease.   Circumflex Artery: Small caliber vessel with small obtuse marginal branch. No obstructive disease.   Right Coronary Artery: Large, dominant vessel with no obstructive disease.   Left Ventricular Angiogram: Anterior wall hypokinesis. LVEF 50-55%.  Impression: 1. Single vessel CAD with patent stent proximal LAD and moderate stenosis mid LAD that does not appear to be flow limiting.  2. Segmental wall motion abnormality with hypokinesis of the anterior wall and apex that is unchanged since his MI. Overall LV function is preserved. 3. Non-cardiac chest pain  Recommendations: Continue medical management.        Complications:  None. The patient tolerated the procedure well.

## 2011-11-10 NOTE — Interval H&P Note (Signed)
History and Physical Interval Note:  11/10/2011 9:08 AM  Nathaniel [name]  has presented today for cardiac cath with the diagnosis of angina, abn stress test  The various methods of treatment have been discussed with the patient and family. After consideration of risks, benefits and other options for treatment, the patient has consented to  Procedure(s) (LRB) with comments: JV LEFT HEART CATHETERIZATION WITH CORONARY ANGIOGRAM (N/A) as a surgical intervention .  The patient's history has been reviewed, patient examined, no change in status, stable for surgery.  I have reviewed the patient's chart and labs.  Questions were answered to the patient's satisfaction.     Salisa Broz

## 2011-11-10 NOTE — Progress Notes (Signed)
Allen's test normal on right hand performed, spo2 96%.

## 2011-11-10 NOTE — H&P (View-Only) (Signed)
 History of Present Illness: 51 y.o. male with history of DM, HTN, OSA, tobacco abuse, OSA and CAD with recent anterior STEMI who is here today for cardiac follow up. He was admitted 3/9-3/11/13 with an anterior STEMI. Emergent LHC 04/17/11: Ostial LAD 100% occluded, mid 50%, mild plaque in the circumflex and RCA, anterior wall and apical hypokinesis with an EF of 45%. PCI: Promus DES to the ostial LAD. He was placed on aspirin and Ticagrelor. He was admitted to [facility] 10/26/11 with chest pain and ruled out for MI. Chest pain was atypical. Outpatient myoview was arranged and showed anterior and apical defect d/w scar and peri-infarct ischemia.   He is here today for follow up. He has been doing well. The patient denies chest pain, shortness of breath, syncope, orthopnea, PND or significant pedal edema. He did not tolerate 80 mg of Lipitor. This made him feel fatigued. He stopped the Lipitor and had his fatigue improve. He has stopped smoking completely. He has been walking Every day. He tolerated Lipitor 20 mg for many years before his MI.  Primary Care Physician: [facility]   Primary Care Physician:  Last Lipid Profile:Lipid Panel     Component Value Date/Time   CHOL 150 10/27/2011 0544   TRIG 51 10/27/2011 0544   HDL 82 10/27/2011 0544   CHOLHDL 1.8 10/27/2011 0544   VLDL 10 10/27/2011 0544   LDLCALC 58 10/27/2011 0544     Past Medical History  Diagnosis Date  . Diabetes mellitus     dx at age 29, not overweight was told he was type 1  . Hyperlipemia   . Sleep apnea     has CPAP does not use  . Depression     has taken meds for depression before  . CAD (coronary artery disease)     a. ant stemi 04/2011 - totalled LAD treated w/ thrombectomy & 3.5 x 16 Promus DES.  OTW nonobs dzs.  EF 45%  . Ischemic cardiomyopathy     a. EF 45% by V-gram 04/2011;  b. Echo 04/19/2011 EF 55%, Gr 1 DD.  . Tobacco abuse     reports he quit after STEMI    Past Surgical History  Procedure Date  . Tonsillectomy   .  Vasectomy   . Cardiac cath with pci 04/17/2011    DES to LAD    Current Outpatient Prescriptions  Medication Sig Dispense Refill  . aspirin 81 MG chewable tablet Chew 1 tablet (81 mg total) by mouth daily.      . atorvastatin (LIPITOR) 20 MG tablet Take 1 tablet (20 mg total) by mouth daily.  90 tablet  3  . carvedilol (COREG) 3.125 MG tablet Take 1 tablet (3.125 mg total) by mouth 2 (two) times daily with a meal.  60 tablet  6  . clonazePAM (KLONOPIN) 0.5 MG tablet Take 0.5 mg by mouth at bedtime as needed. For sleep      . fluticasone (VERAMYST) 27.5 MCG/SPRAY nasal spray Place 2 sprays into the nose daily.      . GLUCAGON EMERGENCY 1 MG injection       . HUMALOG 100 UNIT/ML injection USE IN INSULIN PUMP AS DIRECTED BY PRESCRIBER  5 vial  1  . lisinopril (PRINIVIL,ZESTRIL) 2.5 MG tablet Take half tablet by mouth daily  45 tablet  1  . nitroGLYCERIN (NITROSTAT) 0.4 MG SL tablet Place 1 tablet (0.4 mg total) under the tongue every 5 (five) minutes x 3 doses as needed for chest   pain.  25 tablet  3  . ONE TOUCH ULTRA TEST test strip USE AS NEEDED  100 each  1  . ONETOUCH DELICA LANCETS MISC       . SUMAtriptan Succinate (IMITREX PO) Take 1 tablet by mouth daily as needed. For migraines      . Ticagrelor (BRILINTA) 90 MG TABS tablet Take 1 tablet (90 mg total) by mouth 2 (two) times daily.  60 tablet  6  . escitalopram (LEXAPRO) 10 MG tablet Take 1 tablet (10 mg total) by mouth daily.  90 tablet  1  . DISCONTD: SUMAtriptan (IMITREX) 20 MG/ACT nasal spray Place 1 spray into the nose every 2 (two) hours as needed. For cluster headaches. No more than 2 sprays per day        No Known Allergies  History   Social History  . Marital Status: Divorced    Spouse Name: N/A    Number of Children: 1  . Years of Education: N/A   Occupational History  .    . PRICE ANALYST    Social History Main Topics  . Smoking status: Former Smoker -- 0.5 packs/day for 15 years    Types: Cigarettes    Quit  date: 04/17/2011  . Smokeless tobacco: Never Used  . Alcohol Use: 3.0 - 3.6 oz/week    5-6 Shots of liquor per week     weekends only  . Drug Use: No  . Sexually Active: Not Currently   Other Topics Concern  . Not on file   Social History Narrative   Moved from [place] on July 2010---Regular exercise: started going to the gym ~ 09-2011-----Lives by himself----     Family History  Problem Relation Age of Onset  . Multiple sclerosis Mother   . Parkinsonism Father   . Coronary artery disease Neg Hx   . Hypertension Neg Hx   . Diabetes Neg Hx   . Stroke Father   . Colon cancer Neg Hx   . Prostate cancer Neg Hx     Review of Systems:  As stated in the HPI and otherwise negative.   BP 129/80  Pulse 56  Ht 5' 9" (1.753 m)  Wt 181 lb (82.101 kg)  BMI 26.73 kg/m2  Physical Examination: General: Well developed, well nourished, NAD HEENT: OP clear, mucus membranes moist SKIN: warm, dry. No rashes. Neuro: No focal deficits Musculoskeletal: Muscle strength 5/5 all ext Psychiatric: Mood and affect normal Neck: No JVD, no carotid bruits, no thyromegaly, no lymphadenopathy. Lungs:Clear bilaterally, no wheezes, rhonci, crackles Cardiovascular: Regular rate and rhythm. No murmurs, gallops or rubs. Abdomen:Soft. Bowel sounds present. Non-tender.  Extremities: No lower extremity edema. Pulses are 2 + in the bilateral DP/PT.  Exercise Stress Myoview 11/02/11:  Stress Procedure: The patient performed treadmill exercise using a Bruce Protocol for 8:00 minutes. The patient stopped due to fatigue, doe and denied any chest pain. There were no significant ST-T wave changes and a rare pvc. Technetium 99m Sestamibi was injected at peak exercise and myocardial perfusion imaging was performed after a brief delay.  Stress ECG: No significant change from baseline ECG  QPS  Raw Data Images: Acquisition technically good; normal left ventricular size.  Stress Images: There is decreased uptake in the  anterior wall and apex.  Rest Images: There is decreased uptake in the anterior wall and apex, less prominent compared to the stress images.  Subtraction (SDS): These findings are consistent with prior anterior and apical infarct and mild peri-infarct ischemia.    Transient Ischemic Dilatation (Normal <1.22): 1.01  Lung/Heart Ratio (Normal <0.45): 0.39  Quantitative Gated Spect Images  QGS EDV: 99 ml  QGS ESV: 44 ml  Impression  Exercise Capacity: Good exercise capacity.  BP Response: Normal blood pressure response.  Clinical Symptoms: There is dyspnea.  ECG Impression: No significant ST segment change suggestive of ischemia.  Comparison with Prior Nuclear Study: No previous nuclear study performed  Overall Impression: Abnormal stress nuclear study with a medium size, moderate intensity, partially reversible anterior and apical defect consistent with prior infarct and mild peri-infarct ischemia.  LV Ejection Fraction: 56%. LV Wall Motion: Mild apical hypokinesis.   Assessment and Plan:   1. CAD: Known CAD with anterior STEMI March 2013. Recent chest pain and stress test with possible anterior and apical ischemia in area of infarct. Will arrange left heart catheterization with possible PCI in outpatient cath lab 11/10/11. Pre-cath labs today. R/B reviewed. Minimal exertion until time of cath.  

## 2011-11-11 ENCOUNTER — Encounter (HOSPITAL_BASED_OUTPATIENT_CLINIC_OR_DEPARTMENT_OTHER): Payer: Self-pay

## 2011-11-19 ENCOUNTER — Encounter: Payer: PRIVATE HEALTH INSURANCE | Admitting: Cardiovascular Disease

## 2011-12-16 ENCOUNTER — Ambulatory Visit (INDEPENDENT_AMBULATORY_CARE_PROVIDER_SITE_OTHER): Payer: PRIVATE HEALTH INSURANCE | Admitting: Cardiovascular Disease

## 2011-12-16 ENCOUNTER — Encounter: Payer: Self-pay | Admitting: Cardiovascular Disease

## 2011-12-16 VITALS — BP 133/80 | HR 57 | Ht 69.0 in | Wt 180.0 lb

## 2011-12-16 DIAGNOSIS — I2510 Atherosclerotic heart disease of native coronary artery without angina pectoris: Secondary | ICD-10-CM

## 2011-12-16 NOTE — Patient Instructions (Addendum)
Your physician wants you to follow-up in:  6 months. You will receive a reminder letter in the mail two months in advance. If you don't receive a letter, please call our office to schedule the follow-up appointment.   

## 2011-12-16 NOTE — Progress Notes (Signed)
History of Present Illness: 51 y.o. male with history of DM, HTN, OSA, tobacco abuse, OSA and CAD with recent anterior STEMI who is here today for cardiac follow up. He was admitted 3/9-3/11/13 with an anterior STEMI. Emergent LHC 04/17/11: Ostial LAD 100% occluded, mid 50%, mild plaque in the circumflex and RCA, anterior wall and apical hypokinesis with an EF of 45%. PCI: Promus DES to the ostial LAD. He was placed on aspirin and Ticagrelor. He was admitted to [facility] 10/26/11 with chest pain and ruled out for MI. Chest pain was atypical. Outpatient myoview was arranged and showed anterior and apical defect d/w scar and peri-infarct ischemia. Cardiac cath on 11/10/11 with patent LAD stent, mild disease in mid LAD.   He is here today for follow up.  Feeling better. No chest pain or SOB, syncope, orthopnea, PND or significant pedal edema. He has stopped smoking completely. His girlfriend broke off their relationship and he has been depressed. No exercising. No suicidal thoughts.    Primary Care Physician: Drue Novel  Last Lipid Profile:Lipid Panel     Component Value Date/Time   CHOL 150 10/27/2011 0544   TRIG 51 10/27/2011 0544   HDL 82 10/27/2011 0544   CHOLHDL 1.8 10/27/2011 0544   VLDL 10 10/27/2011 0544   LDLCALC 58 10/27/2011 0544     Past Medical History  Diagnosis Date  . Diabetes mellitus     dx at age 89, not overweight was told he was type 1  . Hyperlipemia   . Sleep apnea     has CPAP does not use  . Depression     has taken meds for depression before  . CAD (coronary artery disease)     a. ant stemi 04/2011 - totalled LAD treated w/ thrombectomy & 3.5 x 16 Promus DES.  OTW nonobs dzs.  EF 45%  . Ischemic cardiomyopathy     a. EF 45% by V-gram 04/2011;  b. Echo 04/19/2011 EF 55%, Gr 1 DD.  . Tobacco abuse     reports he quit after STEMI    Past Surgical History  Procedure Date  . Tonsillectomy   . Vasectomy   . Cardiac cath with pci 04/17/2011    DES to LAD    Current Outpatient  Prescriptions  Medication Sig Dispense Refill  . aspirin 81 MG chewable tablet Chew 1 tablet (81 mg total) by mouth daily.      Marland Kitchen atorvastatin (LIPITOR) 20 MG tablet Take 1 tablet (20 mg total) by mouth daily.  90 tablet  3  . carvedilol (COREG) 3.125 MG tablet Take 1 tablet (3.125 mg total) by mouth 2 (two) times daily with a meal.  60 tablet  6  . clonazePAM (KLONOPIN) 0.5 MG tablet Take 0.5 mg by mouth at bedtime as needed. For sleep      . escitalopram (LEXAPRO) 10 MG tablet Take 1 tablet (10 mg total) by mouth daily.  90 tablet  1  . fluticasone (VERAMYST) 27.5 MCG/SPRAY nasal spray Place 2 sprays into the nose daily.      Marland Kitchen GLUCAGON EMERGENCY 1 MG injection       . HUMALOG 100 UNIT/ML injection USE IN INSULIN PUMP AS DIRECTED BY PRESCRIBER  5 vial  1  . lisinopril (PRINIVIL,ZESTRIL) 2.5 MG tablet Take half tablet by mouth daily  45 tablet  1  . nitroGLYCERIN (NITROSTAT) 0.4 MG SL tablet Place 1 tablet (0.4 mg total) under the tongue every 5 (five) minutes x 3 doses as  needed for chest pain.  25 tablet  3  . ONE TOUCH ULTRA TEST test strip USE AS NEEDED  100 each  1  . ONETOUCH DELICA LANCETS MISC       . SUMAtriptan (IMITREX) 5 MG/ACT nasal spray Place 1 spray into the nose every 2 (two) hours as needed.      . Ticagrelor (BRILINTA) 90 MG TABS tablet Take 1 tablet (90 mg total) by mouth 2 (two) times daily.  60 tablet  6    No Known Allergies  History   Social History  . Marital Status: Divorced    Spouse Name: N/A    Number of Children: 1  . Years of Education: N/A   Occupational History  .    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 Smoking status: Former Smoker -- 0.5 packs/day for 15 years    Types: Cigarettes    Quit date: 04/17/2011  . Smokeless tobacco: Never Used  . Alcohol Use: 3.0 - 3.6 oz/week    5-6 Shots of liquor per week     Comment: weekends only  . Drug Use: No  . Sexually Active: Not Currently   Other Topics Concern  . Not on file   Social  History Narrative   Moved from [place] on July 2010---Regular exercise: started going to the gym ~ 09-2011-----Lives by himself----     Family History  Problem Relation Age of Onset  . Multiple sclerosis Mother   . Parkinsonism Father   . Coronary artery disease Neg Hx   . Hypertension Neg Hx   . Diabetes Neg Hx   . Stroke Father   . Colon cancer Neg Hx   . Prostate cancer Neg Hx     Review of Systems:  As stated in the HPI and otherwise negative.   BP 133/80  Pulse 57  Ht 5\' 9"  (1.753 m)  Wt 180 lb (81.647 kg)  BMI 26.58 kg/m2  Physical Examination: General: Well developed, well nourished, NAD HEENT: OP clear, mucus membranes moist SKIN: warm, dry. No rashes. Neuro: No focal deficits Musculoskeletal: Muscle strength 5/5 all ext Psychiatric: Mood and affect normal Neck: No JVD, no carotid bruits, no thyromegaly, no lymphadenopathy. Lungs:Clear bilaterally, no wheezes, rhonci, crackles Cardiovascular: Regular rate and rhythm. No murmurs, gallops or rubs. Abdomen:Soft. Bowel sounds present. Non-tender.  Extremities: No lower extremity edema. Pulses are 2 + in the bilateral DP/PT. Right wrist cath site ok  Cardiac cath 11/10/11:  Left main: 10% plaque proximal.  Left Anterior Descending Artery: Large caliber vessel that courses to the apex. There is a patent stent extending from the ostium down into the proximal vessel. The stent is widely patent with no restenosis noted. The mid vessel has a long segment of 40% stenosis that is unchanged from prior cath. There are 3 very small diagonal branches with no disease.  Circumflex Artery: Small caliber vessel with small obtuse marginal branch. No obstructive disease.  Right Coronary Artery: Large, dominant vessel with no obstructive disease.  Left Ventricular Angiogram: Anterior wall hypokinesis. LVEF 50-55%.   Assessment and Plan:   1. CAD: Stable. Recent cath with patent LAD stent. He is feeling well. Encouraged to resume  exercise. Continue ASA/Brilinta/statin/beta blocker.   2. Depression: He is on a SSRI. He is not suicidal. He is encouraged to f/u with Dr. Drue Novel for counseling/adjustment of SSRI

## 2012-01-06 ENCOUNTER — Ambulatory Visit (INDEPENDENT_AMBULATORY_CARE_PROVIDER_SITE_OTHER): Payer: PRIVATE HEALTH INSURANCE | Admitting: Family Medicine

## 2012-01-06 VITALS — BP 120/70 | HR 65 | Temp 98.2°F | Resp 16 | Ht 69.0 in | Wt 183.6 lb

## 2012-01-06 DIAGNOSIS — S61209A Unspecified open wound of unspecified finger without damage to nail, initial encounter: Secondary | ICD-10-CM

## 2012-01-06 DIAGNOSIS — S61012A Laceration without foreign body of left thumb without damage to nail, initial encounter: Secondary | ICD-10-CM

## 2012-01-06 DIAGNOSIS — M79609 Pain in unspecified limb: Secondary | ICD-10-CM

## 2012-01-06 MED ORDER — CEPHALEXIN 500 MG PO CAPS: 500.0000 mg | ORAL_CAPSULE | Freq: Three times a day (TID) | ORAL | Status: DC

## 2012-01-06 NOTE — Patient Instructions (Addendum)
WOUND CARE Please return in 14 days to have your stitches/staples removed or sooner if you have concerns.  Keep area clean and dry for 24 hours. Do not remove bandage, if applied.  After 24 hours, remove bandage and wash wound gently with mild soap and warm water. Reapply a new bandage after cleaning wound, if directed.  Continue daily cleansing with soap and water until stitches/staples are removed.  Do not apply any ointments or creams to the wound while stitches/staples are in place, as this may cause delayed healing.  Notify the office if you experience any of the following signs of infection: Swelling, redness, pus drainage, streaking, fever >101.0 F  Notify the office if you experience excessive bleeding that does not stop after 15-20 minutes of constant, firm pressure.  

## 2012-01-06 NOTE — Progress Notes (Signed)
  Subjective:    Patient ID: Nathaniel [name], male    DOB: [birth year], 51 y.o.   MRN: [id number]  HPI    Review of Systems     Objective:   Physical Exam  Consent obtained. 2% plain lidocaine locally 2cc.  expolred wound, no deep structure.  appx with 4.0 ethilon #3 s.i. placed      Assessment & Plan:  Wound care instructions reviewed and h/o given. SR X 14 days

## 2012-01-06 NOTE — Progress Notes (Signed)
Subjective: Patient suffered a laceration of his left thumb last night. He was cutting up some chicken. It bandaged it up did not come in until today. He is due a tetanus shot. He's having to deal of pain in the thumb  Objective: 2 CM wound of thumb. Flexion and extension are intact. Sensory grossly intact.  Assessment: Wound of thumb on the ulnar aspect.  Plan: Topical anesthetic applied while awaiting PA to do suturing. TDAP ---actually got one 7/12

## 2012-01-17 ENCOUNTER — Other Ambulatory Visit: Payer: Self-pay | Admitting: Internal Medicine

## 2012-01-17 NOTE — Telephone Encounter (Signed)
Refill done.  

## 2012-01-19 ENCOUNTER — Ambulatory Visit (INDEPENDENT_AMBULATORY_CARE_PROVIDER_SITE_OTHER): Payer: PRIVATE HEALTH INSURANCE | Admitting: Physician Assistant

## 2012-01-19 VITALS — BP 126/78 | HR 68 | Temp 98.8°F | Resp 16 | Ht 67.5 in | Wt 184.0 lb

## 2012-01-19 DIAGNOSIS — S61209A Unspecified open wound of unspecified finger without damage to nail, initial encounter: Secondary | ICD-10-CM

## 2012-01-19 DIAGNOSIS — S61009A Unspecified open wound of unspecified thumb without damage to nail, initial encounter: Secondary | ICD-10-CM

## 2012-01-19 NOTE — Progress Notes (Signed)
  Subjective:    Patient ID: Nathaniel [name], male    DOB: Jun 09, 1960, 51 y.o.   MRN: [id number]  HPI This 51 y.o. male presents for evaluation of a wound of the LEFT thumb closed here on 01/06/2012.  He's had no problems/concerns.  No pain, redness, drainage, fever.  Completed the course of keflex provided.  Allergies, meds and PMH reviewed.  Review of Systems As above.    Objective:   Physical Exam BP 126/78  Pulse 68  Temp 98.8 F (37.1 C) (Oral)  Resp 16  Ht 5' 7.5" (1.715 m)  Wt 184 lb (83.462 kg)  BMI 28.39 kg/m2  SpO2 99% Well healed laceration of the LEFT thumb. #3 Ethilon SI sutures intact.  No evidence of infection.  Sutures removed without incident. Steri-strips placed with benzoin ointment.       Assessment & Plan:   1. Open wound of thumb    Local wound care.  Anticipatory guidance provided. RTC prn.

## 2012-02-14 ENCOUNTER — Encounter: Payer: Self-pay | Admitting: Family Medicine

## 2012-02-14 ENCOUNTER — Ambulatory Visit (INDEPENDENT_AMBULATORY_CARE_PROVIDER_SITE_OTHER): Payer: PRIVATE HEALTH INSURANCE | Admitting: Family Medicine

## 2012-02-14 VITALS — BP 132/80 | HR 68 | Wt 185.0 lb

## 2012-02-14 DIAGNOSIS — J069 Acute upper respiratory infection, unspecified: Secondary | ICD-10-CM

## 2012-02-14 MED ORDER — HYDROCODONE-HOMATROPINE 5-1.5 MG/5ML PO SYRP: 5.0000 mL | ORAL_SOLUTION | Freq: Three times a day (TID) | ORAL | Status: DC | PRN

## 2012-02-14 NOTE — Progress Notes (Signed)
Chief Complaint  Patient presents with  . Cough    congestion, headache, chills, body aches, sinus pressure, feels like ears are clogged    HPI: -started: 1 week -symptoms:nasal congestion, sore throat, cough, had some body aches, subjective fevers - resolved a few days ago -denies:fever, SOB, NVD, tooth pain, strep or mono exposure -has tried: hycodan - worked -sick contacts: on airplane -Hx of: no hx asthma, does have diabetes  ROS: See pertinent positives and negatives per HPI.  Past Medical History  Diagnosis Date  . Diabetes mellitus     dx at age 11, not overweight was told he was type 1  . Hyperlipemia   . Sleep apnea     has CPAP does not use  . Depression     has taken meds for depression before  . CAD (coronary artery disease)     a. ant stemi 04/2011 - totalled LAD treated w/ thrombectomy & 3.5 x 16 Promus DES.  OTW nonobs dzs.  EF 45%  . Ischemic cardiomyopathy     a. EF 45% by V-gram 04/2011;  b. Echo 04/19/2011 EF 55%, Gr 1 DD.  . Tobacco abuse     reports he quit after STEMI    Family History  Problem Relation Age of Onset  . Multiple sclerosis Mother   . Parkinsonism Father   . Stroke Father   . Coronary artery disease Neg Hx   . Hypertension Neg Hx   . Diabetes Neg Hx   . Colon cancer Neg Hx   . Prostate cancer Neg Hx     History   Social History  . Marital Status: Divorced    Spouse Name: N/A    Number of Children: 1  . Years of Education: N/A   Occupational History  .    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 Smoking status: Former Smoker -- 0.5 packs/day for 15 years    Types: Cigarettes    Quit date: 04/17/2011  . Smokeless tobacco: Never Used  . Alcohol Use: 3.0 - 3.6 oz/week    5-6 Shots of liquor per week     Comment: weekends only  . Drug Use: No  . Sexually Active: Not Currently   Other Topics Concern  . None   Social History Narrative   Moved from [place] on July 2010---Regular exercise: started going to the gym  ~ 09-2011-----Lives by himself----     Current outpatient prescriptions:aspirin 81 MG chewable tablet, Chew 1 tablet (81 mg total) by mouth daily., Disp: , Rfl: ;  atorvastatin (LIPITOR) 20 MG tablet, Take 1 tablet (20 mg total) by mouth daily., Disp: 90 tablet, Rfl: 3;  carvedilol (COREG) 3.125 MG tablet, Take 1 tablet (3.125 mg total) by mouth 2 (two) times daily with a meal., Disp: 60 tablet, Rfl: 6 clonazePAM (KLONOPIN) 0.5 MG tablet, Take 0.5 mg by mouth at bedtime as needed. For sleep, Disp: , Rfl: ;  escitalopram (LEXAPRO) 10 MG tablet, Take 10 mg by mouth daily., Disp: , Rfl: ;  fluticasone (VERAMYST) 27.5 MCG/SPRAY nasal spray, Place 2 sprays into the nose as needed. , Disp: , Rfl: ;  GLUCAGON EMERGENCY 1 MG injection, , Disp: , Rfl: ;  HUMALOG 100 UNIT/ML injection, USE IN INSULIN PUMP AS DIRECTED BY PRESCRIBER, Disp: 5 vial, Rfl: 2 lisinopril (PRINIVIL,ZESTRIL) 2.5 MG tablet, Take half tablet by mouth daily, Disp: 45 tablet, Rfl: 1;  nitroGLYCERIN (NITROSTAT) 0.4 MG SL tablet, Place 1 tablet (0.4 mg  total) under the tongue every 5 (five) minutes x 3 doses as needed for chest pain., Disp: 25 tablet, Rfl: 3;  ONE TOUCH ULTRA TEST test strip, USE AS NEEDED, Disp: 100 each, Rfl: 1;  ONETOUCH DELICA LANCETS MISC, , Disp: , Rfl:  SUMAtriptan (IMITREX) 5 MG/ACT nasal spray, Place 1 spray into the nose every 2 (two) hours as needed., Disp: , Rfl: ;  Ticagrelor (BRILINTA) 90 MG TABS tablet, Take 1 tablet (90 mg total) by mouth 2 (two) times daily., Disp: 60 tablet, Rfl: 6  EXAM:  Filed Vitals:   02/14/12 1359  BP: 132/80  Pulse: 68    There is no height on file to calculate BMI.  GENERAL: vitals reviewed and listed above, alert, oriented, appears well hydrated and in no acute distress  HEENT: atraumatic, conjunttiva clear, no obvious abnormalities on inspection of external nose and ears, normal appearance of ear canals and TMs, clear nasal congestion, mild post oropharyngeal erythema with PND,  no tonsillar edema or exudate, no sinus TTP  NECK: no obvious masses on inspection  LUNGS: clear to auscultation bilaterally, no wheezes, rales or rhonchi, good air movement  CV: HRRR, no peripheral edema  MS: moves all extremities without noticeable abnormality  PSYCH: pleasant and cooperative, no obvious depression or anxiety  ASSESSMENT AND PLAN:  Discussed the following assessment and plan:  1. Upper respiratory infection    -likely influenza or influenza-like illness - discussed options, supportive care with return precuations -Patient advised to return or notify a doctor immediately if symptoms worsen or persist or new concerns arise.  There are no Patient Instructions on file for this visit.   Kriste Basque R.

## 2012-02-14 NOTE — Patient Instructions (Signed)
INSTRUCTIONS FOR UPPER RESPIRATORY INFECTION:  -plenty of rest and fluids  -nasal saline wash [facility]) 2-3 times daily (use prepackaged nasal saline or bottled/distilled water if making your own)   -can use sinex nasal spray for drainage and nasal congestion - but do NOT use longer then 3-4 days  -can use tylenol or ibuprofen as directed for aches and sorethroat  -in the winter time, using a humidifier at night is helpful (please follow cleaning instructions)  -if you are taking a cough medication - use only as directed, may also try a teaspoon of honey to coat the throat and throat lozenges  -for sore throat, salt water gargles can help  -follow up if you have fevers, facial pain, tooth pain, difficulty breathing or are worsening or not getting better in 5-7 days

## 2012-02-25 ENCOUNTER — Telehealth: Payer: Self-pay | Admitting: Internal Medicine

## 2012-02-25 ENCOUNTER — Other Ambulatory Visit: Payer: Self-pay | Admitting: Internal Medicine

## 2012-02-25 MED ORDER — SUMATRIPTAN 5 MG/ACT NA SOLN: 1.0000 | NASAL | Status: DC | PRN

## 2012-02-25 NOTE — Telephone Encounter (Signed)
Refill done.  

## 2012-02-25 NOTE — Telephone Encounter (Signed)
Sumatriptan 5 mg nasal spray

## 2012-02-25 NOTE — Telephone Encounter (Signed)
Duplicate request

## 2012-02-25 NOTE — Telephone Encounter (Signed)
The pt called hoping to get a refill of Imitrex Nasal Spray today.  His callback number is [phone]

## 2012-02-25 NOTE — Telephone Encounter (Signed)
The patient states his insurance needs a prior auth for this medication.  He is hoping he can pick up samples to hold him over for the weekend.

## 2012-02-28 NOTE — Telephone Encounter (Signed)
I've [facility] on 1.17.14 & spent on hold, unable to start prior-auth. I called [facility] today & spent on hold, unable to start prior-auth.

## 2012-02-29 NOTE — Telephone Encounter (Signed)
Case ID #: [id number]

## 2012-02-29 NOTE — Telephone Encounter (Signed)
Prior authorization has been denied. Please advise on whether to appeal or prescribed new med.

## 2012-02-29 NOTE — Telephone Encounter (Signed)
Prior Berkley Harvey form has been completed & faxed back.

## 2012-03-01 MED ORDER — SUMATRIPTAN SUCCINATE 100 MG PO TABS
ORAL_TABLET | ORAL | Status: DC
Start: 2012-03-01 — End: 2012-04-21

## 2012-03-01 NOTE — Addendum Note (Signed)
Addended by: Edwena Felty T on: 03/01/2012 01:29 PM   Modules accepted: Orders

## 2012-03-01 NOTE — Telephone Encounter (Signed)
Imitrex nasal spray denied by insurance. Imitrex 100 mg 1 daily as needed for headache recommended. May repeat x1 after 2 hours. Dispense 9

## 2012-03-01 NOTE — Telephone Encounter (Signed)
Pt called back stating that the generic imitrex

## 2012-03-01 NOTE — Telephone Encounter (Signed)
Spoke with pt, he contacted his insurance company & they gave him a # for me to call to appeal the denied prior auth. I called the # & spent on the phone, they stated that they only way to appeal the denied prior Berkley Harvey is to have Dr. Drue Novel fill out the form & fax back. I called the pt back & explained this to him. I also told the pt that Dr. Alwyn Ren ok the imitrex 100mg  tablets for the time being. Pt asked me to go ahead & send that into his pharmacy. rx sent.

## 2012-03-01 NOTE — Telephone Encounter (Signed)
Pt called back stating that the generic imitrex tablets that were sent in were also requiring a prior auth. The pt stated he has been having a cluster headache for the past week & he is getting no relief. Pt would like to know if there are any other options that he can try?

## 2012-03-01 NOTE — Telephone Encounter (Signed)
Pt called back this morning stating that he is in a lot of pain with his cluster headaches, I explained to the pt that his prior Berkley Harvey was denied by his insurance company. I told the pt I was waiting to hear back from Dr. Drue Novel on whether he is going to appeal the the prior auth or prescribe the pt something else. Pt explained again that he is in a lot of pain & cannot wait.  Is there anything else that the pt can take? Please advise.

## 2012-03-02 NOTE — Telephone Encounter (Signed)
Called pt & notified him we have faxed the forms for the appeal.

## 2012-03-02 NOTE — Telephone Encounter (Signed)
Received results from prior auth appeal & the pt's sumatriptan 20mg  nasal spray has been approved [id number]. Pt & pharmacy made aware.

## 2012-03-02 NOTE — Telephone Encounter (Addendum)
We are appealing the denial of nasal Imitrex. Unfortunately, I have no samples to provide.

## 2012-03-06 ENCOUNTER — Telehealth: Payer: Self-pay | Admitting: Cardiovascular Disease

## 2012-03-06 NOTE — Telephone Encounter (Signed)
Patient called stated he wanted to know if ok to take Sumatriptan 100 mg or 20 mg nasal spray for cluster headaches.Message sent to Dr.McAlhany.

## 2012-03-06 NOTE — Telephone Encounter (Signed)
New problem   Receive medication from another MD. Wants to know is it safe .  Sumatriptan  100 mg or    20 mg nasal spray.

## 2012-03-08 NOTE — Telephone Encounter (Signed)
These medications have vasoconstrictive properties and should be avoided in people with CAD. Can we let him know? Thanks, chris

## 2012-03-08 NOTE — Telephone Encounter (Signed)
Pt notified. He states he is continuing to have severe headaches. He will contact his primary MD regarding possible alternatives or referral to headache specialist.

## 2012-04-05 ENCOUNTER — Telehealth: Payer: Self-pay | Admitting: Cardiovascular Disease

## 2012-04-05 DIAGNOSIS — I2510 Atherosclerotic heart disease of native coronary artery without angina pectoris: Secondary | ICD-10-CM

## 2012-04-05 NOTE — Telephone Encounter (Signed)
Left message to call back  

## 2012-04-05 NOTE — Telephone Encounter (Signed)
New Problem:    Patient called in wanting to know if he needed to be seen before stopping his Ticagrelor (BRILINTA) 90 MG TABS tablet.  Please call back.

## 2012-04-05 NOTE — Telephone Encounter (Signed)
Follow Up    Pt is returning phone call. Has additional questions about medication.

## 2012-04-05 NOTE — Telephone Encounter (Signed)
Spoke with pt. He reports it will be one year on April 16, 2012 from date of his MI/stent and he is asking if he should continue Brilinta past this date.  He reports it is expensive but that he is able to afford it if needed.  I told him I would check with Dr. Clifton James.

## 2012-04-05 NOTE — Telephone Encounter (Signed)
He can stop his Brilinta in march and we can start Plavix 75 mg po Qdaily. Continue ASA as well.  Thanks, chris

## 2012-04-06 ENCOUNTER — Telehealth: Payer: Self-pay | Admitting: *Deleted

## 2012-04-06 MED ORDER — CLOPIDOGREL BISULFATE 75 MG PO TABS: 75.0000 mg | ORAL_TABLET | Freq: Every day | ORAL | Status: DC

## 2012-04-06 MED ORDER — TICAGRELOR 90 MG PO TABS: 90.0000 mg | ORAL_TABLET | Freq: Two times a day (BID) | ORAL | Status: DC

## 2012-04-06 NOTE — Telephone Encounter (Signed)
Pt rtn call , pls call [phone]

## 2012-04-06 NOTE — Telephone Encounter (Signed)
Spoke with pt and gave him instructions from Dr. Clifton James. Will send one month supply of Brilinta to [facility] on [address]. After completing this he will make change to Plavix 75 mg daily. He is aware to continue ASA.  Will send [facility]ogrel to [facility] on [address] with instructions to pharmacy to fill in one month.

## 2012-04-06 NOTE — Telephone Encounter (Signed)
Pt left VM that he would like to discuss some med concern with Dr Alwyn Ren. Pt request a call personality to discuss but is will to make OV if that is needed. Left message to call office to get more details because it seem that Pt has never seen Dr Alfonse Flavors so I need to clarify that he did not mean Dr Drue Novel.

## 2012-04-07 LAB — HM DIABETES EYE EXAM

## 2012-04-13 ENCOUNTER — Telehealth: Payer: Self-pay | Admitting: Internal Medicine

## 2012-04-13 NOTE — Telephone Encounter (Signed)
Spoke with Nathaniel [name] who states that he would like to discuss stopping the lexapro. Nathaniel [name] is schedule for OV on tomorrow but if this issue can be addressed over the phone Nathaniel [name] will cancel OV. Nathaniel [name] notes that he does not want to go on any other med just would like to stop this med.Please advise

## 2012-04-13 NOTE — Telephone Encounter (Signed)
Discuss with patient will keep OV and discuss further then

## 2012-04-13 NOTE — Telephone Encounter (Signed)
error 

## 2012-04-13 NOTE — Telephone Encounter (Addendum)
If he is doing well and likes to stop lexapro, is ok, I would take 1/2 tab x 2 weeks then stop. Also, he is overdue for a visit, if he likes to keep the appointment for tomorrow that be perfect

## 2012-04-14 ENCOUNTER — Ambulatory Visit: Payer: PRIVATE HEALTH INSURANCE | Admitting: Internal Medicine

## 2012-04-21 ENCOUNTER — Ambulatory Visit (INDEPENDENT_AMBULATORY_CARE_PROVIDER_SITE_OTHER): Payer: No Typology Code available for payment source | Admitting: Internal Medicine

## 2012-04-21 ENCOUNTER — Encounter: Payer: Self-pay | Admitting: Internal Medicine

## 2012-04-21 VITALS — BP 122/80 | HR 76 | Wt 185.0 lb

## 2012-04-21 DIAGNOSIS — F3289 Other specified depressive episodes: Secondary | ICD-10-CM

## 2012-04-21 DIAGNOSIS — I2510 Atherosclerotic heart disease of native coronary artery without angina pectoris: Secondary | ICD-10-CM

## 2012-04-21 DIAGNOSIS — G44009 Cluster headache syndrome, unspecified, not intractable: Secondary | ICD-10-CM

## 2012-04-21 DIAGNOSIS — E109 Type 1 diabetes mellitus without complications: Secondary | ICD-10-CM

## 2012-04-21 DIAGNOSIS — F329 Major depressive disorder, single episode, unspecified: Secondary | ICD-10-CM

## 2012-04-21 DIAGNOSIS — F17200 Nicotine dependence, unspecified, uncomplicated: Secondary | ICD-10-CM

## 2012-04-21 DIAGNOSIS — Z720 Tobacco use: Secondary | ICD-10-CM

## 2012-04-21 MED ORDER — SUMATRIPTAN 5 MG/ACT NA SOLN: 1.0000 | NASAL | Status: DC | PRN

## 2012-04-21 MED ORDER — SUMATRIPTAN 20 MG/ACT NA SOLN: 1.0000 | NASAL | Status: DC | PRN

## 2012-04-21 NOTE — Assessment & Plan Note (Signed)
Reports his last hemoglobin A1c was 6.9 at endocrinology. Had a recent eye exam, got good reports.

## 2012-04-21 NOTE — Assessment & Plan Note (Signed)
Continued to be tobacco free, praised!

## 2012-04-21 NOTE — Assessment & Plan Note (Signed)
They formulation that works the best for him is Imitrex 20 mg nasal spray, a prescription was issued

## 2012-04-21 NOTE — Assessment & Plan Note (Signed)
Feeling better, ready to discontinue Lexapro, see instructions.

## 2012-04-21 NOTE — Patient Instructions (Addendum)
Bring labs done at endocrinology at your earliest convenience lexapro 10 mg: take 1/2 tablet a day x 2 weeks, then stop, call id symptoms resurface Schedule a physical in 4-6 months, fasting

## 2012-04-21 NOTE — Progress Notes (Signed)
  Subjective:    Patient ID: Nathaniel [name], male    DOB: [birth year], 52 y.o.   MRN: [id number]  HPI Here to discuss the following issues On Lexapro since he had a heart attack, doing great. Would like to stop it. No problems at home or at work. Would like a new prescription for Imitrex, of all the  formulations the one that works the best for him is the  20 mg nasal spray.  Past Medical History  Diagnosis Date  . Diabetes mellitus     dx at age 27, not overweight was told he was type 1  . Hyperlipemia   . Sleep apnea     has CPAP does not use  . Depression     has taken meds for depression before  . CAD (coronary artery disease)     a. ant stemi 04/2011 - totalled LAD treated w/ thrombectomy & 3.5 x 16 Promus DES.  OTW nonobs dzs.  EF 45%  . Ischemic cardiomyopathy     a. EF 45% by V-gram 04/2011;  b. Echo 04/19/2011 EF 55%, Gr 1 DD.  . Tobacco abuse     reports he quit after STEMI   Past Surgical History  Procedure Laterality Date  . Tonsillectomy    . Vasectomy    . Cardiac cath with pci  04/17/2011    DES to LAD     Review of Systems No chest pain or shortness or breath No nausea, vomiting, diarrhea. He recently saw endocrinology, A1c was 6.9, they did a number of other labs including his cholesterol per patient. Still  tobacco free.    Objective:   Physical Exam BP 122/80  Pulse 76  Wt 185 lb (83.915 kg)  BMI 28.53 kg/m2  SpO2 97%  General -- alert, well-developed Lungs -- normal respiratory effort, no intercostal retractions, no accessory muscle use, and normal breath sounds.   Heart-- normal rate, regular rhythm, no murmur, and no gallop.    Neurologic-- alert & oriented X3 and strength normal in all extremities. Psych-- Cognition and judgment appear intact. Alert and cooperative with normal attention span and concentration.  not anxious appearing and not depressed appearing.       Assessment & Plan:

## 2012-04-21 NOTE — Assessment & Plan Note (Addendum)
Asymptomatic, taking all the appropriate medications, to see cards  in few months. Reports that his cholesterol was checked by endocrinology, recommend to get me the records.

## 2012-05-01 ENCOUNTER — Encounter: Payer: Self-pay | Admitting: *Deleted

## 2012-05-30 ENCOUNTER — Other Ambulatory Visit: Payer: Self-pay

## 2012-05-30 DIAGNOSIS — I2510 Atherosclerotic heart disease of native coronary artery without angina pectoris: Secondary | ICD-10-CM

## 2012-05-30 MED ORDER — CLOPIDOGREL BISULFATE 75 MG PO TABS: 75.0000 mg | ORAL_TABLET | Freq: Every day | ORAL | Status: DC

## 2012-06-14 ENCOUNTER — Other Ambulatory Visit: Payer: Self-pay | Admitting: *Deleted

## 2012-06-14 DIAGNOSIS — I2510 Atherosclerotic heart disease of native coronary artery without angina pectoris: Secondary | ICD-10-CM

## 2012-06-14 MED ORDER — CLOPIDOGREL BISULFATE 75 MG PO TABS: 75.0000 mg | ORAL_TABLET | Freq: Every day | ORAL | Status: DC

## 2012-06-15 ENCOUNTER — Encounter: Payer: Self-pay | Admitting: Cardiovascular Disease

## 2012-06-15 ENCOUNTER — Ambulatory Visit (INDEPENDENT_AMBULATORY_CARE_PROVIDER_SITE_OTHER): Payer: No Typology Code available for payment source | Admitting: Cardiovascular Disease

## 2012-06-15 VITALS — BP 138/92 | HR 64 | Ht 69.0 in | Wt 184.0 lb

## 2012-06-15 DIAGNOSIS — I2510 Atherosclerotic heart disease of native coronary artery without angina pectoris: Secondary | ICD-10-CM

## 2012-06-15 NOTE — Progress Notes (Signed)
History of Present Illness: 52 y.o. male with history of DM, HTN, OSA, tobacco abuse, OSA and CAD with recent anterior STEMI who is here today for cardiac follow up. He was admitted 3/9-3/11/13 with an anterior STEMI. Emergent LHC 04/17/11: Ostial LAD 100% occluded, mid 50%, mild plaque in the circumflex and RCA, anterior wall and apical hypokinesis with an EF of 45%. PCI: Promus DES to the ostial LAD. He was placed on aspirin and Ticagrelor. He was admitted to [facility] 10/26/11 with chest pain and ruled out for MI. Chest pain was atypical. Outpatient myoview was arranged and showed anterior and apical defect d/w scar and peri-infarct ischemia. Cardiac cath on 11/10/11 with patent LAD stent, mild disease in mid LAD.   He is here today for follow up. Feeling well.  No chest pain or SOB, syncope, orthopnea, PND or significant pedal edema. He has stopped smoking completely. No exercising.   Primary Care Physician: Drue Novel  Last Lipid Profile: In Endocrine clinic: 03/29/12: Total chol: 159  HDL: 67  LDL: 71   Past Medical History  Diagnosis Date  . Diabetes mellitus     dx at age 42, not overweight was told he was type 1  . Hyperlipemia   . Sleep apnea     has CPAP does not use  . Depression     has taken meds for depression before  . CAD (coronary artery disease)     a. ant stemi 04/2011 - totalled LAD treated w/ thrombectomy & 3.5 x 16 Promus DES.  OTW nonobs dzs.  EF 45%  . Ischemic cardiomyopathy     a. EF 45% by V-gram 04/2011;  b. Echo 04/19/2011 EF 55%, Gr 1 DD.  . Tobacco abuse     reports he quit after STEMI    Past Surgical History  Procedure Laterality Date  . Tonsillectomy    . Vasectomy    . Cardiac cath with pci  04/17/2011    DES to LAD    Current Outpatient Prescriptions  Medication Sig Dispense Refill  . atorvastatin (LIPITOR) 20 MG tablet Take 1 tablet (20 mg total) by mouth daily.  90 tablet  3  . clonazePAM (KLONOPIN) 0.5 MG tablet Take 0.5 mg by mouth at bedtime as needed.  For sleep      . clopidogrel (PLAVIX) 75 MG tablet Take 1 tablet (75 mg total) by mouth daily.  90 tablet  3  . fluticasone (VERAMYST) 27.5 MCG/SPRAY nasal spray Place 2 sprays into the nose as needed.       Marland Kitchen GLUCAGON EMERGENCY 1 MG injection       . HUMALOG 100 UNIT/ML injection USE IN INSULIN PUMP AS DIRECTED BY PRESCRIBER  5 vial  2  . lisinopril (PRINIVIL,ZESTRIL) 2.5 MG tablet Take half tablet by mouth daily  45 tablet  1  . ONE TOUCH ULTRA TEST test strip USE AS NEEDED  100 each  1  . ONETOUCH DELICA LANCETS MISC       . SUMAtriptan (IMITREX) 20 MG/ACT nasal spray Place 1 spray (20 mg total) into the nose every 2 (two) hours as needed for migraine.  4 Inhaler  3  . carvedilol (COREG) 3.125 MG tablet Take 1 tablet (3.125 mg total) by mouth 2 (two) times daily with a meal.  60 tablet  6   No current facility-administered medications for this visit.    No Known Allergies  History   Social History  . Marital Status: Divorced    Spouse  Name: N/A    Number of Children: 1  . Years of Education: N/A   Occupational History  .    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 Smoking status: Former Smoker -- 0.50 packs/day for 15 years    Types: Cigarettes    Quit date: 04/17/2011  . Smokeless tobacco: Never Used  . Alcohol Use: 3 - 3.6 oz/week    5-6 Shots of liquor per week     Comment: weekends only  . Drug Use: No  . Sexually Active: Not Currently   Other Topics Concern  . Not on file   Social History Narrative   Moved from [place] on July 2010---   Regular exercise: started going to the gym ~ 09-2011-----   Lives by himself----     Family History  Problem Relation Age of Onset  . Multiple sclerosis Mother   . Parkinsonism Father   . Stroke Father   . Coronary artery disease Neg Hx   . Hypertension Neg Hx   . Diabetes Neg Hx   . Colon cancer Neg Hx   . Prostate cancer Neg Hx     Review of Systems:  As stated in the HPI and otherwise negative.   BP  138/92  Pulse 64  Ht 5\' 9"  (1.753 m)  Wt 184 lb (83.462 kg)  BMI 27.16 kg/m2  Physical Examination: General: Well developed, well nourished, NAD HEENT: OP clear, mucus membranes moist SKIN: warm, dry. No rashes. Neuro: No focal deficits Musculoskeletal: Muscle strength 5/5 all ext Psychiatric: Mood and affect normal Neck: No JVD, no carotid bruits, no thyromegaly, no lymphadenopathy. Lungs:Clear bilaterally, no wheezes, rhonci, crackles Cardiovascular: Regular rate and rhythm. No murmurs, gallops or rubs. Abdomen:Soft. Bowel sounds present. Non-tender.  Extremities: No lower extremity edema. Pulses are 2 + in the bilateral DP/PT.  Assessment and Plan:   1. CAD:  Stable. Follow up cath 11/10/11 with patent LAD stent. He is feeling well. He is on ASA/Plavix/statin/beta blocker/Ace-inh. No medication changes today. Resume exercise.

## 2012-06-15 NOTE — Patient Instructions (Addendum)
Your physician wants you to follow-up in:  12 months.  You will receive a reminder letter in the mail two months in advance. If you don't receive a letter, please call our office to schedule the follow-up appointment.   

## 2012-07-24 ENCOUNTER — Other Ambulatory Visit: Payer: Self-pay | Admitting: Cardiovascular Disease

## 2012-07-26 ENCOUNTER — Telehealth: Payer: Self-pay | Admitting: Internal Medicine

## 2012-07-26 NOTE — Telephone Encounter (Signed)
Ok to refill? Last OV 3.14.14

## 2012-07-27 ENCOUNTER — Encounter: Payer: Self-pay | Admitting: *Deleted

## 2012-07-27 NOTE — Telephone Encounter (Signed)
Needs A controlled substance agreement and UDS RF done

## 2012-07-27 NOTE — Telephone Encounter (Signed)
Pt made aware rx & controlled substance contract is ready to be picked up at front desk.  

## 2012-08-01 ENCOUNTER — Telehealth: Payer: Self-pay | Admitting: Internal Medicine

## 2012-08-01 DIAGNOSIS — G44009 Cluster headache syndrome, unspecified, not intractable: Secondary | ICD-10-CM

## 2012-08-01 NOTE — Telephone Encounter (Signed)
Patient is calling requesting a referral to see a headache specialist. States that the headache medicine we have him on leads to cardiac problems which he does not want to take any longer.

## 2012-08-01 NOTE — Telephone Encounter (Signed)
Referral entered  

## 2012-08-01 NOTE — Telephone Encounter (Signed)
Arrange a visit w/  Dr  Neale Burly or neurology

## 2012-09-01 ENCOUNTER — Encounter: Payer: Self-pay | Admitting: Internal Medicine

## 2012-09-14 ENCOUNTER — Encounter: Payer: Self-pay | Admitting: Cardiovascular Disease

## 2012-09-26 ENCOUNTER — Other Ambulatory Visit: Payer: Self-pay | Admitting: Cardiovascular Disease

## 2012-10-12 ENCOUNTER — Other Ambulatory Visit: Payer: Self-pay | Admitting: Cardiovascular Disease

## 2013-01-16 ENCOUNTER — Other Ambulatory Visit: Payer: Self-pay | Admitting: Internal Medicine

## 2013-01-16 NOTE — Telephone Encounter (Signed)
Humalog refilled per protocol. OV due.

## 2013-02-09 ENCOUNTER — Other Ambulatory Visit: Payer: Self-pay

## 2013-02-09 DIAGNOSIS — I2510 Atherosclerotic heart disease of native coronary artery without angina pectoris: Secondary | ICD-10-CM

## 2013-02-09 MED ORDER — CARVEDILOL 3.125 MG PO TABS: ORAL_TABLET | ORAL | Status: DC

## 2013-02-09 MED ORDER — ATORVASTATIN CALCIUM 20 MG PO TABS: ORAL_TABLET | ORAL | Status: DC

## 2013-02-09 MED ORDER — CLOPIDOGREL BISULFATE 75 MG PO TABS: 75.0000 mg | ORAL_TABLET | Freq: Every day | ORAL | Status: DC

## 2013-02-09 MED ORDER — LISINOPRIL 2.5 MG PO TABS: ORAL_TABLET | ORAL | Status: DC

## 2013-05-05 LAB — TSH: TSH: 1.83 u[IU]/mL (ref <=5.90)

## 2013-06-18 ENCOUNTER — Ambulatory Visit (INDEPENDENT_AMBULATORY_CARE_PROVIDER_SITE_OTHER): Payer: No Typology Code available for payment source | Admitting: Cardiovascular Disease

## 2013-06-18 ENCOUNTER — Encounter: Payer: Self-pay | Admitting: Cardiovascular Disease

## 2013-06-18 VITALS — BP 138/80 | HR 55 | Ht 69.0 in | Wt 183.0 lb

## 2013-06-18 DIAGNOSIS — F17201 Nicotine dependence, unspecified, in remission: Secondary | ICD-10-CM

## 2013-06-18 DIAGNOSIS — E785 Hyperlipidemia, unspecified: Secondary | ICD-10-CM

## 2013-06-18 DIAGNOSIS — I10 Essential (primary) hypertension: Secondary | ICD-10-CM

## 2013-06-18 DIAGNOSIS — I2510 Atherosclerotic heart disease of native coronary artery without angina pectoris: Secondary | ICD-10-CM

## 2013-06-18 DIAGNOSIS — Z87891 Personal history of nicotine dependence: Secondary | ICD-10-CM

## 2013-06-18 NOTE — Progress Notes (Signed)
History of Present Illness: 53 y.o. male with history of DM, HTN, OSA, tobacco abuse, OSA and CAD with anterior STEMI March 2013 who is here today for cardiac follow up. He was admitted 04/17/11-04/19/11 with an anterior STEMI. Emergent LHC 04/17/11: Ostial LAD 100% occluded, mid 50%, mild plaque in the circumflex and RCA, anterior wall and apical hypokinesis with an EF of 45%. PCI: Promus DES to the ostial LAD. He was placed on aspirin and Ticagrelor. He was admitted to [facility] 10/26/11 with chest pain and ruled out for MI. Chest pain was atypical. Outpatient myoview was arranged and showed anterior and apical defect d/w scar and peri-infarct ischemia. Cardiac cath on 11/10/11 with patent LAD stent, mild disease in mid LAD.   He is here today for follow up. Feeling well.  No chest pain or SOB, syncope, orthopnea, PND or significant pedal edema. He has stopped smoking completely. No daily exercise.   Primary Care Physician: Nathaniel [name]  Last Lipid Profile: In Endocrine clinic: 03/29/12: Total chol: 159  HDL: 67  LDL: 71   Past Medical History  Diagnosis Date  . Diabetes mellitus     dx at age 27, not overweight was told he was type 1  . Hyperlipemia   . Sleep apnea     has CPAP does not use  . Depression     has taken meds for depression before  . CAD (coronary artery disease)     a. ant stemi 04/2011 - totalled LAD treated w/ thrombectomy & 3.5 x 16 Promus DES.  OTW nonobs dzs.  EF 45%  . Ischemic cardiomyopathy     a. EF 45% by V-gram 04/2011;  b. Echo 04/19/2011 EF 55%, Gr 1 DD.  . Tobacco abuse     reports he quit after STEMI    Past Surgical History  Procedure Laterality Date  . Tonsillectomy    . Vasectomy    . Cardiac cath with pci  04/17/2011    DES to LAD    Current Outpatient Prescriptions  Medication Sig Dispense Refill  . aspirin EC 81 MG tablet Take 81 mg by mouth daily.      Marland Kitchen atorvastatin (LIPITOR) 20 MG tablet TAKE 1 TABLET DAILY  90 tablet  2  . carvedilol (COREG) 3.125 MG  tablet TAKE 1 TABLET TWICE A DAY WITH MEALS  180 tablet  2  . clonazePAM (KLONOPIN) 0.5 MG tablet TAKE 1 TABLET BY MOUTH AT BEDTIME AS NEEDED  30 tablet  2  . clopidogrel (PLAVIX) 75 MG tablet Take 1 tablet (75 mg total) by mouth daily.  90 tablet  3  . fluticasone (VERAMYST) 27.5 MCG/SPRAY nasal spray Place 2 sprays into the nose as needed.       Marland Kitchen GLUCAGON EMERGENCY 1 MG injection       . insulin aspart (NOVOLOG) 100 UNIT/ML injection Inject 35 Units into the skin 3 (three) times daily before meals.      Marland Kitchen lisinopril (PRINIVIL,ZESTRIL) 2.5 MG tablet TAKE 1 TABLET DAILY  90 tablet  3  . ONE TOUCH ULTRA TEST test strip USE AS NEEDED  100 each  1  . ONETOUCH DELICA LANCETS MISC        No current facility-administered medications for this visit.    No Known Allergies  History   Social History  . Marital Status: Divorced    Spouse Name: N/A    Number of Children: 1  . Years of Education: N/A   Occupational History  .    Marland Kitchen  PRICE ANALYST    Social History Main Topics  . Smoking status: Former Smoker -- 0.50 packs/day for 15 years    Types: Cigarettes    Quit date: 04/17/2011  . Smokeless tobacco: Never Used  . Alcohol Use: 3.0 - 3.6 oz/week    5-6 Shots of liquor per week     Comment: weekends only  . Drug Use: No  . Sexual Activity: Not Currently   Other Topics Concern  . Not on file   Social History Narrative   Moved from [place] on July 2010---   Regular exercise: started going to the gym ~ 09-2011-----   Lives by himself----     Family History  Problem Relation Age of Onset  . Multiple sclerosis Mother   . Parkinsonism Father   . Stroke Father   . Coronary artery disease Neg Hx   . Hypertension Neg Hx   . Diabetes Neg Hx   . Colon cancer Neg Hx   . Prostate cancer Neg Hx     Review of Systems:  As stated in the HPI and otherwise negative.   BP 138/80  Pulse 55  Ht 5\' 9"  (1.753 m)  Wt 183 lb (83.008 kg)  BMI 27.01 kg/m2  Physical Examination: General:  Well developed, well nourished, NAD HEENT: OP clear, mucus membranes moist SKIN: warm, dry. No rashes. Neuro: No focal deficits Musculoskeletal: Muscle strength 5/5 all ext Psychiatric: Mood and affect normal Neck: No JVD, no carotid bruits, no thyromegaly, no lymphadenopathy. Lungs:Clear bilaterally, no wheezes, rhonci, crackles Cardiovascular: Regular rate and rhythm. No murmurs, gallops or rubs. Abdomen:Soft. Bowel sounds present. Non-tender.  Extremities: No lower extremity edema. Pulses are 2 + in the bilateral DP/PT.  EKG: Sinus, rate 55 bpm. Old septal infarct.   Assessment and Plan:   1. CAD:  Stable. Follow up cath 11/10/11 with patent LAD stent. He is feeling well. He is on ASA/Plavix/statin/beta blocker/Ace-inh. No medication changes today. Resume exercise. I spent 5 minutes reviewing the need for daily exercise.   2. HTN: BP well controlled. No changes.   3. Tobacco abuse, in remission: He has stopped smoking.   4. Hyperlipidemia: Continue statin. Lipids controlled.

## 2013-06-18 NOTE — Patient Instructions (Signed)
Your physician wants you to follow-up in:  12 months.  You will receive a reminder letter in the mail two months in advance. If you don't receive a letter, please call our office to schedule the follow-up appointment.   

## 2013-08-07 ENCOUNTER — Other Ambulatory Visit: Payer: Self-pay | Admitting: Cardiovascular Disease

## 2014-01-17 ENCOUNTER — Encounter (HOSPITAL_COMMUNITY): Payer: Self-pay | Admitting: Cardiovascular Disease

## 2014-01-21 ENCOUNTER — Other Ambulatory Visit: Payer: Self-pay | Admitting: Cardiovascular Disease

## 2014-02-15 ENCOUNTER — Telehealth: Payer: Self-pay | Admitting: *Deleted

## 2014-02-15 NOTE — Telephone Encounter (Signed)
Received fax from Dr. Catalina Gravel requesting pt have EKG in our office in 2 weeks.  I spoke with pt and appt made for EKG on March 01, 2014 at 10:00.  Dr. Angelena Form in office this day and can review EKG.

## 2014-03-04 ENCOUNTER — Ambulatory Visit (INDEPENDENT_AMBULATORY_CARE_PROVIDER_SITE_OTHER): Payer: No Typology Code available for payment source

## 2014-03-04 VITALS — BP 114/74 | HR 51 | Resp 18 | Ht 69.0 in | Wt 188.0 lb

## 2014-03-04 DIAGNOSIS — R001 Bradycardia, unspecified: Secondary | ICD-10-CM

## 2014-03-04 NOTE — Progress Notes (Signed)
Reason for visit: EKG to see if patient is tolerating Verapamil medication for cluster headaches  Name of MD requesting visit: Dr. Catalina Gravel and Dr. Angelena Form  1.) H&P: Patient has history of DM, HTN, OSA, CAD, and cluster headaches.   2.) ROS related to problem: Patient has no complaints of headaches, chest pain, SOB, dizziness or lightheadedness. Patient's EKG was Sinus Loletha Grayer with heart rate at 51, patient is asymptomatic. Patient states, "I feel fine." BP 114/74, HR 51, Resp. 18  3.) Assessment and plan per MD: Dr. Angelena Form signed off on EKG.  No new orders at this time. Will forward EKG and note to Dr. Catalina Gravel.

## 2014-03-04 NOTE — Patient Instructions (Signed)
Your physician recommends that you continue on your current medications as directed. Please refer to the Current Medication list given to you today.     

## 2014-03-09 ENCOUNTER — Other Ambulatory Visit: Payer: Self-pay | Admitting: Cardiovascular Disease

## 2014-03-23 ENCOUNTER — Other Ambulatory Visit: Payer: Self-pay | Admitting: Cardiovascular Disease

## 2014-03-29 ENCOUNTER — Ambulatory Visit (INDEPENDENT_AMBULATORY_CARE_PROVIDER_SITE_OTHER): Payer: No Typology Code available for payment source | Admitting: Internal Medicine

## 2014-03-29 ENCOUNTER — Encounter: Payer: Self-pay | Admitting: Internal Medicine

## 2014-03-29 VITALS — BP 113/78 | HR 70 | Temp 98.0°F | Wt 186.0 lb

## 2014-03-29 DIAGNOSIS — R198 Other specified symptoms and signs involving the digestive system and abdomen: Secondary | ICD-10-CM

## 2014-03-29 DIAGNOSIS — R194 Change in bowel habit: Secondary | ICD-10-CM

## 2014-03-29 MED ORDER — DICYCLOMINE HCL 10 MG PO CAPS: 10.0000 mg | ORAL_CAPSULE | Freq: Three times a day (TID) | ORAL | Status: DC

## 2014-03-29 NOTE — Patient Instructions (Signed)
Try Bentyl 1 tablet before your meals 3 times a day as needed  Take a probiotic such as align 1 daily for the next month  If you are not improving in the next 2 weeks or you get  worse: Please call the office  Please schedule a physical exam at your earliest convenience, come back fasting

## 2014-03-29 NOTE — Progress Notes (Signed)
Pre visit review using our clinic review tool, if applicable. No additional management support is needed unless otherwise documented below in the visit note. 

## 2014-03-29 NOTE — Progress Notes (Signed)
Subjective:    Patient ID: Nathaniel [name], male    DOB: [birth year], 54 y.o.   MRN: [id number]  DOS:  03/29/2014 Type of visit - description : acute Interval history: For the last 3 weeks has noted loose stool 1 hour postprandial associated with mild  stomach discomfort described as tightness but no cramps. The loose stools are consistent, happening two or 3 times a day. pepto or  Imodium did not help. Denies any foreign travels, no unusual foods. No recent antibiotics. He  did to start taking verapamil 3 weeks ago for headache control, self discontinued a week ago because the headaches are gone.   Review of Systems Denies fevers, chills. No weight loss, appetite remains "very good". No nausea, vomiting, blood in the stools. No GERD symptoms, no change in diet  Past Medical History  Diagnosis Date  . Diabetes mellitus     dx at age 21, not overweight was told he was type 1  . Hyperlipemia   . Sleep apnea     has CPAP does not use  . Depression     has taken meds for depression before  . CAD (coronary artery disease)     a. ant stemi 04/2011 - totalled LAD treated w/ thrombectomy & 3.5 x 16 Promus DES.  OTW nonobs dzs.  EF 45%  . Ischemic cardiomyopathy     a. EF 45% by V-gram 04/2011;  b. Echo 04/19/2011 EF 55%, Gr 1 DD.  . Tobacco abuse     reports he quit after STEMI    Past Surgical History  Procedure Laterality Date  . Tonsillectomy    . Vasectomy    . Cardiac cath with pci  04/17/2011    DES to LAD  . Left heart catheterization with coronary angiogram N/A 04/17/2011    Procedure: LEFT HEART CATHETERIZATION WITH CORONARY ANGIOGRAM;  Surgeon: Burnell Blanks, MD;  Location: [facility] CATH LAB;  Service: Cardiovascular;  Laterality: N/A;  . Percutaneous coronary stent intervention (pci-s)  04/17/2011    Procedure: PERCUTANEOUS CORONARY STENT INTERVENTION (PCI-S);  Surgeon: Burnell Blanks, MD;  Location: [facility] CATH LAB;  Service: Cardiovascular;;    History   Social  History  . Marital Status: Divorced    Spouse Name: N/A  . Number of Children: 1  . Years of Education: N/A   Occupational History  .    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 Smoking status: Former Smoker -- 0.50 packs/day for 15 years    Types: Cigarettes    Quit date: 04/17/2011  . Smokeless tobacco: Never Used  . Alcohol Use: 3.0 - 3.6 oz/week    5-6 Shots of liquor per week     Comment: weekends only  . Drug Use: No  . Sexual Activity: Not Currently   Other Topics Concern  . Not on file   Social History Narrative   Moved from [place] on July 2010---   Regular exercise: started going to the gym ~ 09-2011-----   Lives by himself----         Medication List       This list is accurate as of: 03/29/14 11:59 PM.  Always use your most recent med list.               aspirin EC 81 MG tablet  Take 81 mg by mouth daily.     atorvastatin 20 MG tablet  Commonly known as:  LIPITOR  TAKE 1 TABLET DAILY  carvedilol 3.125 MG tablet  Commonly known as:  COREG  TAKE 1 TABLET TWICE A DAY WITH MEALS     clopidogrel 75 MG tablet  Commonly known as:  PLAVIX  TAKE 1 TABLET (75 MG TOTAL) BY MOUTH DAILY.     dicyclomine 10 MG capsule  Commonly known as:  BENTYL  Take 1 capsule (10 mg total) by mouth 4 (four) times daily -  before meals and at bedtime.     GLUCAGON EMERGENCY 1 MG injection  Generic drug:  glucagon     IMODIUM PO  Take by mouth as needed.     lisinopril 2.5 MG tablet  Commonly known as:  PRINIVIL,ZESTRIL  TAKE 1 TABLET DAILY     NOVOLOG 100 UNIT/ML injection  Generic drug:  insulin aspart  Inject 35 Units into the skin once. Insulin pump     ONE TOUCH ULTRA TEST test strip  Generic drug:  glucose blood  USE AS NEEDED     ONETOUCH DELICA LANCETS Misc     verapamil 80 MG tablet  Commonly known as:  CALAN  Take 80 mg by mouth 2 (two) times daily. May increase by 80 mg every 4 days as needed and as tolerated up to 80 mg 4 times a day  for cluster headaches           Objective:   Physical Exam BP 113/78 mmHg  Pulse 70  Temp(Src) 98 F (36.7 C) (Oral)  Wt 186 lb (84.369 kg)  SpO2 99% General:   Well developed, well nourished . NAD.  HEENT:  Normocephalic . Face symmetric, atraumatic. Not pale or jaundice Lungs:  CTA B Normal respiratory effort, no intercostal retractions, no accessory muscle use. Heart: RRR,  no murmur.  Abdomen:  Not distended, soft, non-tender. No rebound or rigidity. No mass,organomegaly Muscle skeletal: no pretibial edema bilaterally   Neurologic:  alert & oriented X3.  Speech normal, gait appropriate for age and unassisted Psych--  Cognition and judgment appear intact.  Cooperative with normal attention span and concentration.  Behavior appropriate. No anxious or depressed appearing.       Assessment & Plan:

## 2014-03-31 DIAGNOSIS — R198 Other specified symptoms and signs involving the digestive system and abdomen: Secondary | ICD-10-CM | POA: Insufficient documentation

## 2014-03-31 NOTE — Assessment & Plan Note (Signed)
Loose stools, 3 weeks history of postprandial loose stools, no red flag symptoms such as fever, chills, blood in the stools. No recent antibiotics. It could be a mild but persistent infection. Viral?. Had a colonoscopy 2012, + polyps, next in 5 years Plan: Bentyl to help with abdominal pain Probiotics If he is not improving in the next 2-3 weeks he will call, most likely will need stool studies.  Also, due for a physical, see instructions

## 2014-04-08 ENCOUNTER — Ambulatory Visit (INDEPENDENT_AMBULATORY_CARE_PROVIDER_SITE_OTHER): Payer: No Typology Code available for payment source | Admitting: Internal Medicine

## 2014-04-08 ENCOUNTER — Encounter: Payer: Self-pay | Admitting: Internal Medicine

## 2014-04-08 VITALS — BP 98/62 | HR 56 | Temp 97.8°F | Ht 69.0 in | Wt 182.2 lb

## 2014-04-08 DIAGNOSIS — R194 Change in bowel habit: Secondary | ICD-10-CM

## 2014-04-08 DIAGNOSIS — R198 Other specified symptoms and signs involving the digestive system and abdomen: Secondary | ICD-10-CM

## 2014-04-08 LAB — CBC WITH DIFFERENTIAL/PLATELET
Basophils Absolute: 0 10*3/uL (ref 0.0–0.1)
Basophils Relative: 0.9 % (ref 0.0–3.0)
Eosinophils Absolute: 0.1 10*3/uL (ref 0.0–0.7)
Eosinophils Relative: 2.4 % (ref 0.0–5.0)
HCT: 42.3 % (ref 39.0–52.0)
Hemoglobin: 14.4 g/dL (ref 13.0–17.0)
Lymphocytes Relative: 26.7 % (ref 12.0–46.0)
Lymphs Abs: 1 10*3/uL (ref 0.7–4.0)
MCHC: 34.1 g/dL (ref 30.0–36.0)
MCV: 94 fl (ref 78.0–100.0)
MONO ABS: 0.5 10*3/uL (ref 0.1–1.0)
Monocytes Relative: 12.1 % — ABNORMAL HIGH (ref 3.0–12.0)
NEUTROS ABS: 2.3 10*3/uL (ref 1.4–7.7)
Neutrophils Relative %: 57.9 % (ref 43.0–77.0)
PLATELETS: 276 10*3/uL (ref 150.0–400.0)
RBC: 4.49 Mil/uL (ref 4.22–5.81)
RDW: 14.2 % (ref 11.5–15.5)
WBC: 3.9 10*3/uL — ABNORMAL LOW (ref 4.0–10.5)

## 2014-04-08 LAB — COMPREHENSIVE METABOLIC PANEL
ALT: 19 U/L (ref 0–53)
AST: 19 U/L (ref 0–37)
Albumin: 4.3 g/dL (ref 3.5–5.2)
Alkaline Phosphatase: 77 U/L (ref 39–117)
BILIRUBIN TOTAL: 0.6 mg/dL (ref 0.2–1.2)
BUN: 21 mg/dL (ref 6–23)
CALCIUM: 9.4 mg/dL (ref 8.4–10.5)
CO2: 27 meq/L (ref 19–32)
Chloride: 109 mEq/L (ref 96–112)
Creatinine, Ser: 1.11 mg/dL (ref 0.40–1.50)
GFR: 73.44 mL/min (ref >60.00)
GLUCOSE: 76 mg/dL (ref 70–99)
Potassium: 4.3 mEq/L (ref 3.5–5.1)
SODIUM: 141 meq/L (ref 135–145)
TOTAL PROTEIN: 7.4 g/dL (ref 6.0–8.3)

## 2014-04-08 NOTE — Progress Notes (Signed)
Pre visit review using our clinic review tool, if applicable. No additional management support is needed unless otherwise documented below in the visit note. 

## 2014-04-08 NOTE — Assessment & Plan Note (Signed)
Continue with loose/watery stools. Infectious? C. difficile? Bacterial overgrowth? Plan: CMP, CBC, TSH Stool studies GI referral

## 2014-04-08 NOTE — Progress Notes (Signed)
Subjective:    Patient ID: Nathaniel [name], male    DOB: Jan 29, 1961, 54 y.o.   MRN: [id number]  DOS:  04/08/2014 Type of visit - description : acute Interval history: Since the last time he was here, he is doing about the same, started bentyl and a probiotic, if anything he is  slightly worse. Continued to have 2 or 3 BMs daily usually 1 hour within a meal, stools are loose and sometimes even watery. He continue with a ill-defined abdominal discomfort.   Review of Systems Denies fever, chills. Appetite is okay. No nausea, vomiting, blood in the stools  Past Medical History  Diagnosis Date  . Diabetes mellitus     dx at age 82, not overweight was told he was type 1  . Hyperlipemia   . Sleep apnea     has CPAP does not use  . Depression     has taken meds for depression before  . CAD (coronary artery disease)     a. ant stemi 04/2011 - totalled LAD treated w/ thrombectomy & 3.5 x 16 Promus DES.  OTW nonobs dzs.  EF 45%  . Ischemic cardiomyopathy     a. EF 45% by V-gram 04/2011;  b. Echo 04/19/2011 EF 55%, Gr 1 DD.  . Tobacco abuse     reports he quit after STEMI    Past Surgical History  Procedure Laterality Date  . Tonsillectomy    . Vasectomy    . Cardiac cath with pci  04/17/2011    DES to LAD  . Left heart catheterization with coronary angiogram N/A 04/17/2011    Procedure: LEFT HEART CATHETERIZATION WITH CORONARY ANGIOGRAM;  Surgeon: Burnell Blanks, MD;  Location[facility] CATH LAB;  Service: Cardiovascular;  Laterality: N/A;  . Percutaneous coronary stent intervention (pci-s)  04/17/2011    Procedure: PERCUTANEOUS CORONARY STENT INTERVENTION (PCI-S);  Surgeon: Burnell Blanks, MD;  Location: [facility] CATH LAB;  Service: Cardiovascular;;    History   Social History  . Marital Status: Divorced    Spouse Name: N/A  . Number of Children: 1  . Years of Education: N/A   Occupational History  .    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 Smoking status:  Former Smoker -- 0.50 packs/day for 15 years    Types: Cigarettes    Quit date: 04/17/2011  . Smokeless tobacco: Never Used  . Alcohol Use: 3.0 - 3.6 oz/week    5-6 Shots of liquor per week     Comment: weekends only  . Drug Use: No  . Sexual Activity: Not Currently   Other Topics Concern  . Not on file   Social History Narrative   Moved from [place] on July 2010---   Regular exercise: started going to the gym ~ 09-2011-----   Lives by himself----         Medication List       This list is accurate as of: 04/08/14  9:30 PM.  Always use your most recent med list.               aspirin EC 81 MG tablet  Take 81 mg by mouth daily.     atorvastatin 20 MG tablet  Commonly known as:  LIPITOR  TAKE 1 TABLET DAILY     carvedilol 3.125 MG tablet  Commonly known as:  COREG  TAKE 1 TABLET TWICE A DAY WITH MEALS     clopidogrel 75 MG tablet  Commonly  known as:  PLAVIX  TAKE 1 TABLET (75 MG TOTAL) BY MOUTH DAILY.     dicyclomine 10 MG capsule  Commonly known as:  BENTYL  Take 1 capsule (10 mg total) by mouth 4 (four) times daily -  before meals and at bedtime.     GLUCAGON EMERGENCY 1 MG injection  Generic drug:  glucagon     IMODIUM PO  Take by mouth as needed.     lisinopril 2.5 MG tablet  Commonly known as:  PRINIVIL,ZESTRIL  TAKE 1 TABLET DAILY     NOVOLOG 100 UNIT/ML injection  Generic drug:  insulin aspart  Inject 35 Units into the skin once. Insulin pump     ONE TOUCH ULTRA TEST test strip  Generic drug:  glucose blood  USE AS NEEDED     ONETOUCH DELICA LANCETS Misc           Objective:   Physical Exam BP 98/62 mmHg  Pulse 56  Temp(Src) 97.8 F (36.6 C) (Oral)  Ht 5\' 9"  (1.753 m)  Wt 182 lb 4 oz (82.668 kg)  BMI 26.90 kg/m2  SpO2 96% General:   Well developed, well nourished . NAD.  HEENT:  Normocephalic . Face symmetric, atraumatic  Abdomen:  Not distended, soft, non-tender. No rebound or rigidity. No mass,organomegaly Muscle  skeletal: no pretibial edema bilaterally  Skin: Not pale. Not jaundice Neurologic:  alert & oriented X3.  Speech normal, gait appropriate for age and unassisted Psych--  Cognition and judgment appear intact.  Cooperative with normal attention span and concentration.  Behavior appropriate. No anxious or depressed appearing.       Assessment & Plan:

## 2014-04-08 NOTE — Patient Instructions (Signed)
Get your blood work before you leave   LAB--  please provide a container to the patient. Needs to bring a stool sample: C. difficile, culture, WBCs Dx change in bowel habits Please enter the orders

## 2014-04-09 ENCOUNTER — Encounter: Payer: Self-pay | Admitting: Gastroenterology

## 2014-04-09 LAB — TSH: TSH: 0.93 u[IU]/mL (ref 0.35–4.50)

## 2014-04-10 ENCOUNTER — Other Ambulatory Visit: Payer: Self-pay | Admitting: Internal Medicine

## 2014-04-10 ENCOUNTER — Encounter: Payer: Self-pay | Admitting: Internal Medicine

## 2014-04-10 ENCOUNTER — Other Ambulatory Visit (INDEPENDENT_AMBULATORY_CARE_PROVIDER_SITE_OTHER): Payer: No Typology Code available for payment source

## 2014-04-10 DIAGNOSIS — R194 Change in bowel habit: Secondary | ICD-10-CM

## 2014-04-11 ENCOUNTER — Telehealth: Payer: Self-pay | Admitting: Internal Medicine

## 2014-04-11 LAB — C. DIFFICILE GDH AND TOXIN A/B
C. DIFF TOXIN A/B: NOT DETECTED
C. DIFFICILE GDH: NOT DETECTED

## 2014-04-11 LAB — FECAL LACTOFERRIN, QUANT: LACTOFERRIN: POSITIVE

## 2014-04-11 NOTE — Telephone Encounter (Signed)
Caller name: Tommey, Barret Relation to pt: self  Call back number: [phone]  Reason for call:  Pt inquiring about stool culture. Please advise

## 2014-04-11 NOTE — Telephone Encounter (Signed)
Pt just had stool culture performed yesterday (04/10/2014), I believe it takes up to 48 to 72 hours for cultures to "grow bacteria." We will call him as soon as we get the results.

## 2014-04-14 LAB — STOOL CULTURE

## 2014-04-19 ENCOUNTER — Ambulatory Visit (INDEPENDENT_AMBULATORY_CARE_PROVIDER_SITE_OTHER): Payer: No Typology Code available for payment source | Admitting: Nurse Practitioner

## 2014-04-19 ENCOUNTER — Encounter: Payer: Self-pay | Admitting: Nurse Practitioner

## 2014-04-19 ENCOUNTER — Other Ambulatory Visit (INDEPENDENT_AMBULATORY_CARE_PROVIDER_SITE_OTHER): Payer: No Typology Code available for payment source

## 2014-04-19 VITALS — BP 120/80 | HR 76 | Ht 67.5 in | Wt 182.1 lb

## 2014-04-19 DIAGNOSIS — R197 Diarrhea, unspecified: Secondary | ICD-10-CM

## 2014-04-19 LAB — IGA: IgA: 238 mg/dL (ref 68–378)

## 2014-04-19 MED ORDER — METRONIDAZOLE 250 MG PO TABS: ORAL_TABLET | ORAL | Status: DC

## 2014-04-19 MED ORDER — DIPHENOXYLATE-ATROPINE 2.5-0.025 MG PO TABS: ORAL_TABLET | ORAL | Status: DC

## 2014-04-19 NOTE — Patient Instructions (Signed)
Please go to the basement level to have your labs drawn.  We sent a prescription to [place]. 1. Flagyl ( Metronidazole)  We have faxed the Lomotil for Diarrhea prescription to [facility].  Call us in an update in 10 days. Ask for Chanda Busing nurse. We have given you a low fiber diet brochure.

## 2014-04-19 NOTE — Addendum Note (Signed)
Addended by: Willia Craze on: 04/19/2014 09:53 PM   Modules accepted: Level of Service

## 2014-04-19 NOTE — Progress Notes (Signed)
HPI :   Patient is a 54 year old male He had s screening colonoscopy by Dr. Ardis Hughs August 2012 with findings of a sigmoid pedunculated polyp. Path compatible with tubular adenoma , no high-grade dysplasia. On colonoscopy recall list for Aug 2017  Patient is referred by PCP for evaluation of loose stool. PCP prescribed dicyclomine and probiotics mid February. At follow-up there late February, patient had not improved and was possibly worse. Comprehensive metabolic profile was unremarkable. White count 3.9, hgb 14.4. Stool  lactoferrin positive but C. difficile toxin A/B and culture both negative.  Diarrhea started 5 weeks ago. Patient describes watery, non-bloody stool mainly postprandial but occasionally during the night. . Patient may have up to 10 loose stools a day. He has mild associated cramping. His blood sugars have been controlled, running under 200. His weight is stable.  Patient has not made any dietary changes or started any new medication. No other country travel. He's tried to decrease caffeine intake. TSH normal.    Past Medical History  Diagnosis Date  . Diabetes mellitus     dx at age 58, not overweight was told he was type 1  . Hyperlipemia   . Sleep apnea     has CPAP does not use  . Depression     has taken meds for depression before  . CAD (coronary artery disease)     a. ant stemi 04/2011 - totalled LAD treated w/ thrombectomy & 3.5 x 16 Promus DES.  OTW nonobs dzs.  EF 45%  . Ischemic cardiomyopathy     a. EF 45% by V-gram 04/2011;  b. Echo 04/19/2011 EF 55%, Gr 1 DD.  . Tobacco abuse     reports he quit after STEMI  . Chronic headaches     cluster     Family History  Problem Relation Age of Onset  . Multiple sclerosis Mother   . Parkinsonism Father   . Stroke Father   . Coronary artery disease Neg Hx   . Hypertension Neg Hx   . Diabetes Neg Hx   . Colon cancer Neg Hx   . Prostate cancer Neg Hx    History  Substance Use Topics  . Smoking status:  Former Smoker -- 0.50 packs/day for 15 years    Types: Cigarettes    Quit date: 04/17/2011  . Smokeless tobacco: Never Used  . Alcohol Use: 3.0 - 3.6 oz/week    5-6 Shots of liquor per week     Comment: weekends only   Current Outpatient Prescriptions  Medication Sig Dispense Refill  . aspirin EC 81 MG tablet Take 81 mg by mouth daily.    Marland Kitchen atorvastatin (LIPITOR) 20 MG tablet TAKE 1 TABLET DAILY 90 tablet 0  . carvedilol (COREG) 3.125 MG tablet TAKE 1 TABLET TWICE A DAY WITH MEALS 180 tablet 1  . clopidogrel (PLAVIX) 75 MG tablet TAKE 1 TABLET (75 MG TOTAL) BY MOUTH DAILY. 90 tablet 2  . GLUCAGON EMERGENCY 1 MG injection     . insulin aspart (NOVOLOG) 100 UNIT/ML injection Inject 35 Units into the skin once. Insulin pump    . lisinopril (PRINIVIL,ZESTRIL) 2.5 MG tablet TAKE 1 TABLET DAILY (Patient taking differently: Take 1.25 mg by mouth daily. ) 90 tablet 3  . ONE TOUCH ULTRA TEST test strip USE AS NEEDED 100 each 1  . ONETOUCH DELICA LANCETS MISC      No current facility-administered medications for this visit.   No Known Allergies  Review of Systems: All systems reviewed and negative except where noted in HPI.    Physical Exam: BP 120/80 mmHg  Pulse 76  Ht 5' 7.5" (1.715 m)  Wt 182 lb 2 oz (82.611 kg)  BMI 28.09 kg/m2 Constitutional: Pleasant,well-developed, white male in no acute distress. HEENT: Normocephalic and atraumatic. Conjunctivae are normal. No scleral icterus. Neck supple.  Cardiovascular: Normal rate, regular rhythm.  Pulmonary/chest: Effort normal and breath sounds normal. No wheezing, rales or rhonchi. Abdominal: Soft, nondistended, nontender. Bowel sounds active throughout. There are no masses palpable. No hepatomegaly. Extremities: no edema Lymphadenopathy: No cervical adenopathy noted. Neurological: Alert and oriented to person place and time. Skin: Skin is warm and dry. No rashes noted. Psychiatric: Normal mood and affect. Behavior is  normal.   ASSESSMENT AND PLAN:   29. 54 year old male with several week history of multiple watery stools a day (mainly postprandial). Stool lactoferrin positive implying inflammatory process. Stool culture and C. difficile negative (toxin A/B and GDH).  Etiology of loose stool not clear.   Will treat empirically for infectious etiology.   obtain celiac studies though doubt celiac disease   If diarrhea does not resolve then patient will need colonoscopy, possibly with random biopsies.  Imodium hasn't helped, trial of lomotil   2. CAD  / STEMI 2013, s/p DES. On chronic plavix

## 2014-04-22 LAB — TISSUE TRANSGLUTAMINASE, IGG: TISSUE TRANSGLUT AB: 1 U/mL (ref <6)

## 2014-04-22 NOTE — Progress Notes (Signed)
I agree with the above note, plan 

## 2014-04-24 ENCOUNTER — Telehealth: Payer: Self-pay | Admitting: Nurse Practitioner

## 2014-04-24 NOTE — Telephone Encounter (Signed)
Reporting fewer bowel movements with Lomotil. He is 5 days on the ATB. He does not feel any better. He thinks he does not have infectious diarrhea and would like to just go ahead with the "next step".

## 2014-04-25 ENCOUNTER — Other Ambulatory Visit: Payer: Self-pay

## 2014-04-25 MED ORDER — MOVIPREP 100 G PO SOLR: 1.0000 | Freq: Once | ORAL | Status: DC

## 2014-04-25 NOTE — Telephone Encounter (Signed)
Nathaniel [name], please schedule him for a colonoscopy to be done in a couple of weeks if possible. I encourage him in meantime to complete antibiotics. While waiting on colonoscopy if diarrhea does resolve with the antibiotics then we can cancel the colonoscopy.  Thanks

## 2014-04-25 NOTE — Telephone Encounter (Signed)
Patient advised. He is in agreement with this plan. Will schedule the colonoscopy for 05/16/14. Contacted cardiology for instructions on the Plavix. Dr Minette Brine contacted for instructions on the patient's insulin pump. Patient states he does not need to visit with the RN about instructions. States he has been instructed recently. I have him scheduled at the hospital since he refuses pre-op visit to determine safety of [place] procedure.

## 2014-04-26 ENCOUNTER — Other Ambulatory Visit: Payer: Self-pay

## 2014-04-29 NOTE — Telephone Encounter (Signed)
Dr Ardis Hughs,  Please review this and advise me on how this needs to be handled.

## 2014-04-29 NOTE — Telephone Encounter (Signed)
I agree with doing this procedure at hospital.  He seems to not want to be seen in our office to ensure he understands instructions for the colonoscopy.  That is OK with me if he can be advised on these instructions over the phone and that phone encounter documented in EPIC.  Thanks

## 2014-05-02 ENCOUNTER — Encounter (HOSPITAL_COMMUNITY): Payer: Self-pay | Admitting: *Deleted

## 2014-05-02 ENCOUNTER — Telehealth: Payer: Self-pay | Admitting: Cardiovascular Disease

## 2014-05-02 NOTE — Telephone Encounter (Signed)
Pt calling wanting to know when to stop his Plavix prior to Colonoscopy on 4/7.  Advised pt that physician's office needs to send request to [place] on what they need stopped and for how long.  He states that he was told that a request had been sent.  Advised that Dr. Julianne Handler nor his nurse Enis Slipper is in office today but will forward message to them.

## 2014-05-02 NOTE — Telephone Encounter (Signed)
His Plavix can be stopped 7 days before his planned surgical procedure. I will be glad to sign any paperwork in the office when I come back on Monday. Gerald Stabs

## 2014-05-02 NOTE — Telephone Encounter (Signed)
Pat, Do we have any paperwork for this? chris

## 2014-05-02 NOTE — Telephone Encounter (Signed)
Request for surgical clearance:  1. What type of surgery is being performed? Colonoscapy  2. When is this surgery scheduled? 05/16/14  3. Are there any medications that need to be held prior to surgery and how long? They are needing to know if Plavix needs to be held prior to procedure and if so, for how long.  4. Name of physician performing surgery? Dr. Ardis Hughs at [facility]. What is your office phone and fax number? Pt does not have this info available  Pt calling, please call back and advise.

## 2014-05-03 ENCOUNTER — Other Ambulatory Visit: Payer: Self-pay | Admitting: Nurse Practitioner

## 2014-05-06 NOTE — Telephone Encounter (Signed)
I spoke with pt and told him he could stop Plavix 7 days prior to colonoscopy.  Pt made aware MD doing procedure would give him instructions on when to resume.  There is no paperwork in office but there is a letter in chart from Dr. Gerilyn Nestle.  Will forward this phone note to them.

## 2014-05-13 NOTE — Telephone Encounter (Signed)
Confirmed with the patient he had received the written instructions for his prep and for his Plavix. Questions invited and answered.

## 2014-05-16 ENCOUNTER — Ambulatory Visit (HOSPITAL_COMMUNITY): Payer: PRIVATE HEALTH INSURANCE | Admitting: Anesthesiology

## 2014-05-16 ENCOUNTER — Encounter (HOSPITAL_COMMUNITY)
Admission: RE | Disposition: A | Discharge status: Home / Self Care | Payer: Self-pay | Source: Ambulatory Visit | Attending: Gastroenterology

## 2014-05-16 ENCOUNTER — Encounter (HOSPITAL_COMMUNITY): Payer: Self-pay | Admitting: Anesthesiology

## 2014-05-16 ENCOUNTER — Ambulatory Visit (HOSPITAL_COMMUNITY)
Admission: RE | Admit: 2014-05-16 | Discharge: 2014-05-16 | Disposition: A | Discharge status: Home / Self Care | Payer: PRIVATE HEALTH INSURANCE | Source: Ambulatory Visit | Attending: Gastroenterology | Admitting: Gastroenterology

## 2014-05-16 DIAGNOSIS — G4730 Sleep apnea, unspecified: Secondary | ICD-10-CM | POA: Diagnosis not present

## 2014-05-16 DIAGNOSIS — E119 Type 2 diabetes mellitus without complications: Secondary | ICD-10-CM | POA: Diagnosis not present

## 2014-05-16 DIAGNOSIS — Z7902 Long term (current) use of antithrombotics/antiplatelets: Secondary | ICD-10-CM | POA: Diagnosis not present

## 2014-05-16 DIAGNOSIS — Z794 Long term (current) use of insulin: Secondary | ICD-10-CM | POA: Diagnosis not present

## 2014-05-16 DIAGNOSIS — I2510 Atherosclerotic heart disease of native coronary artery without angina pectoris: Secondary | ICD-10-CM | POA: Diagnosis not present

## 2014-05-16 DIAGNOSIS — K5730 Diverticulosis of large intestine without perforation or abscess without bleeding: Secondary | ICD-10-CM | POA: Insufficient documentation

## 2014-05-16 DIAGNOSIS — E785 Hyperlipidemia, unspecified: Secondary | ICD-10-CM | POA: Diagnosis not present

## 2014-05-16 DIAGNOSIS — R197 Diarrhea, unspecified: Secondary | ICD-10-CM | POA: Diagnosis not present

## 2014-05-16 DIAGNOSIS — Z7982 Long term (current) use of aspirin: Secondary | ICD-10-CM | POA: Diagnosis not present

## 2014-05-16 DIAGNOSIS — Z87891 Personal history of nicotine dependence: Secondary | ICD-10-CM | POA: Diagnosis not present

## 2014-05-16 DIAGNOSIS — I252 Old myocardial infarction: Secondary | ICD-10-CM | POA: Insufficient documentation

## 2014-05-16 DIAGNOSIS — F329 Major depressive disorder, single episode, unspecified: Secondary | ICD-10-CM | POA: Insufficient documentation

## 2014-05-16 DIAGNOSIS — Z955 Presence of coronary angioplasty implant and graft: Secondary | ICD-10-CM | POA: Diagnosis not present

## 2014-05-16 DIAGNOSIS — I255 Ischemic cardiomyopathy: Secondary | ICD-10-CM | POA: Diagnosis not present

## 2014-05-16 HISTORY — PX: COLONOSCOPY WITH PROPOFOL: SHX5780

## 2014-05-16 LAB — GLUCOSE, CAPILLARY: Glucose-Capillary: 114 mg/dL — ABNORMAL HIGH (ref 70–99)

## 2014-05-16 SURGERY — COLONOSCOPY WITH PROPOFOL
Anesthesia: Monitor Anesthesia Care

## 2014-05-16 MED ORDER — LACTATED RINGERS IV SOLN
INTRAVENOUS | Status: DC
  Administered 2014-05-16: 1000 mL via INTRAVENOUS

## 2014-05-16 MED ORDER — PROPOFOL INFUSION 10 MG/ML OPTIME
INTRAVENOUS | Status: DC | PRN
  Administered 2014-05-16: 180 ug/kg/min via INTRAVENOUS

## 2014-05-16 MED ORDER — LOPERAMIDE HCL 2 MG PO TABS: 2.0000 mg | ORAL_TABLET | Freq: Two times a day (BID) | ORAL | Status: DC

## 2014-05-16 MED ORDER — INSULIN ASPART 100 UNIT/ML ~~LOC~~ SOLN
0.0000 [IU] | SUBCUTANEOUS | Status: DC
  Filled 2014-05-16: qty 0.15

## 2014-05-16 MED ORDER — MIDAZOLAM HCL 5 MG/5ML IJ SOLN
INTRAMUSCULAR | Status: DC | PRN
Start: 2014-05-16 — End: 2014-05-16
  Administered 2014-05-16: 1 mg via INTRAVENOUS
  Administered 2014-05-16: 0.5 mg via INTRAVENOUS

## 2014-05-16 MED ORDER — SODIUM CHLORIDE 0.9 % IV SOLN: INTRAVENOUS | Status: DC

## 2014-05-16 MED ORDER — FENTANYL CITRATE 0.05 MG/ML IJ SOLN: 25.0000 ug | INTRAMUSCULAR | Status: DC | PRN

## 2014-05-16 MED ORDER — LIDOCAINE HCL (CARDIAC) 20 MG/ML IV SOLN
INTRAVENOUS | Status: DC | PRN
  Administered 2014-05-16: 30 mg via INTRAVENOUS

## 2014-05-16 SURGICAL SUPPLY — 21 items

## 2014-05-16 NOTE — Discharge Instructions (Signed)

## 2014-05-16 NOTE — Anesthesia Postprocedure Evaluation (Signed)
  Anesthesia Post-op Note  Patient: Nathaniel [name]  Procedure(s) Performed: Procedure(s) (LRB): COLONOSCOPY WITH PROPOFOL (N/A)  Patient Location: PACU  Anesthesia Type: MAC  Level of Consciousness: awake and alert   Airway and Oxygen Therapy: Patient Spontanous Breathing  Post-op Pain: mild  Post-op Assessment: Post-op Vital signs reviewed, Patient's Cardiovascular Status Stable, Respiratory Function Stable, Patent Airway and No signs of Nausea or vomiting  Last Vitals:  Filed Vitals:   05/16/14 0910  BP:   Pulse: 55  Temp:   Resp: 14    Post-op Vital Signs: stable   Complications: No apparent anesthesia complications

## 2014-05-16 NOTE — Transfer of Care (Signed)
Immediate Anesthesia Transfer of Care Note  Patient: Nathaniel [name]  Procedure(s) Performed: Procedure(s): COLONOSCOPY WITH PROPOFOL (N/A)  Patient Location: PACU and Endoscopy Unit  Anesthesia Type:MAC  Level of Consciousness: awake, alert , oriented and patient cooperative  Airway & Oxygen Therapy: Patient Spontanous Breathing and Patient connected to face mask oxygen  Post-op Assessment: Report given to RN and Post -op Vital signs reviewed and stable  Post vital signs: Reviewed and stable  Last Vitals:  Filed Vitals:   05/16/14 0842  BP: 100/62  Pulse: 70  Temp:   Resp: 16    Complications: No apparent anesthesia complications

## 2014-05-16 NOTE — Interval H&P Note (Signed)
History and Physical Interval Note:  05/16/2014 8:08 AM  Nathaniel [name]  has presented today for surgery, with the diagnosis of diarrhea  The various methods of treatment have been discussed with the patient and family. After consideration of risks, benefits and other options for treatment, the patient has consented to  Procedure(s): COLONOSCOPY WITH PROPOFOL (N/A) as a surgical intervention .  The patient's history has been reviewed, patient examined, no change in status, stable for surgery.  I have reviewed the patient's chart and labs.  Questions were answered to the patient's satisfaction.     [name]

## 2014-05-16 NOTE — Anesthesia Preprocedure Evaluation (Addendum)
Anesthesia Evaluation  Patient identified by MRN, date of birth, ID band Patient awake    Reviewed: Allergy & Precautions, H&P , NPO status , Patient's Chart, lab work & pertinent test results, reviewed documented beta blocker date and time   Airway Mallampati: III  TM Distance: >3 FB Neck ROM: full    Dental no notable dental hx. (+) Teeth Intact, Dental Advisory Given   Pulmonary sleep apnea , former smoker,  breath sounds clear to auscultation  Pulmonary exam normal       Cardiovascular + CAD, + Past MI and + Cardiac Stents Rhythm:regular Rate:Normal  Ischemic cardiomyopathy EF 45%. Anterior STEMI 3/13   Neuro/Psych  Headaches, negative neurological ROS  negative psych ROS   GI/Hepatic negative GI ROS, Neg liver ROS,   Endo/Other  diabetes, Well Controlled, Type 1, Insulin Dependent  Renal/GU negative Renal ROS  negative genitourinary   Musculoskeletal   Abdominal   Peds  Hematology negative hematology ROS (+)   Anesthesia Other Findings   Reproductive/Obstetrics negative OB ROS                            Anesthesia Physical Anesthesia Plan  ASA: III  Anesthesia Plan: MAC   Post-op Pain Management:    Induction:   Airway Management Planned:   Additional Equipment:   Intra-op Plan:   Post-operative Plan:   Informed Consent: I have reviewed the patients History and Physical, chart, labs and discussed the procedure including the risks, benefits and alternatives for the proposed anesthesia with the patient or authorized representative who has indicated his/her understanding and acceptance.   Dental Advisory Given  Plan Discussed with: CRNA and Surgeon  Anesthesia Plan Comments:         Anesthesia Quick Evaluation

## 2014-05-16 NOTE — H&P (View-Only) (Signed)
HPI :   Patient is a 54 year old male He had s screening colonoscopy by Dr. Ardis Hughs August 2012 with findings of a sigmoid pedunculated polyp. Path compatible with tubular adenoma , no high-grade dysplasia. On colonoscopy recall list for Aug 2017  Patient is referred by PCP for evaluation of loose stool. PCP prescribed dicyclomine and probiotics mid February. At follow-up there late February, patient had not improved and was possibly worse. Comprehensive metabolic profile was unremarkable. White count 3.9, hgb 14.4. Stool  lactoferrin positive but C. difficile toxin A/B and culture both negative.  Diarrhea started 5 weeks ago. Patient describes watery, non-bloody stool mainly postprandial but occasionally during the night. . Patient may have up to 10 loose stools a day. He has mild associated cramping. His blood sugars have been controlled, running under 200. His weight is stable.  Patient has not made any dietary changes or started any new medication. No other country travel. He's tried to decrease caffeine intake. TSH normal.    Past Medical History  Diagnosis Date  . Diabetes mellitus     dx at age 83, not overweight was told he was type 1  . Hyperlipemia   . Sleep apnea     has CPAP does not use  . Depression     has taken meds for depression before  . CAD (coronary artery disease)     a. ant stemi 04/2011 - totalled LAD treated w/ thrombectomy & 3.5 x 16 Promus DES.  OTW nonobs dzs.  EF 45%  . Ischemic cardiomyopathy     a. EF 45% by V-gram 04/2011;  b. Echo 04/19/2011 EF 55%, Gr 1 DD.  . Tobacco abuse     reports he quit after STEMI  . Chronic headaches     cluster     Family History  Problem Relation Age of Onset  . Multiple sclerosis Mother   . Parkinsonism Father   . Stroke Father   . Coronary artery disease Neg Hx   . Hypertension Neg Hx   . Diabetes Neg Hx   . Colon cancer Neg Hx   . Prostate cancer Neg Hx    History  Substance Use Topics  . Smoking status:  Former Smoker -- 0.50 packs/day for 15 years    Types: Cigarettes    Quit date: 04/17/2011  . Smokeless tobacco: Never Used  . Alcohol Use: 3.0 - 3.6 oz/week    5-6 Shots of liquor per week     Comment: weekends only   Current Outpatient Prescriptions  Medication Sig Dispense Refill  . aspirin EC 81 MG tablet Take 81 mg by mouth daily.    Marland Kitchen atorvastatin (LIPITOR) 20 MG tablet TAKE 1 TABLET DAILY 90 tablet 0  . carvedilol (COREG) 3.125 MG tablet TAKE 1 TABLET TWICE A DAY WITH MEALS 180 tablet 1  . clopidogrel (PLAVIX) 75 MG tablet TAKE 1 TABLET (75 MG TOTAL) BY MOUTH DAILY. 90 tablet 2  . GLUCAGON EMERGENCY 1 MG injection     . insulin aspart (NOVOLOG) 100 UNIT/ML injection Inject 35 Units into the skin once. Insulin pump    . lisinopril (PRINIVIL,ZESTRIL) 2.5 MG tablet TAKE 1 TABLET DAILY (Patient taking differently: Take 1.25 mg by mouth daily. ) 90 tablet 3  . ONE TOUCH ULTRA TEST test strip USE AS NEEDED 100 each 1  . ONETOUCH DELICA LANCETS MISC      No current facility-administered medications for this visit.   No Known Allergies  Review of Systems: All systems reviewed and negative except where noted in HPI.    Physical Exam: BP 120/80 mmHg  Pulse 76  Ht 5' 7.5" (1.715 m)  Wt 182 lb 2 oz (82.611 kg)  BMI 28.09 kg/m2 Constitutional: Pleasant,well-developed, white male in no acute distress. HEENT: Normocephalic and atraumatic. Conjunctivae are normal. No scleral icterus. Neck supple.  Cardiovascular: Normal rate, regular rhythm.  Pulmonary/chest: Effort normal and breath sounds normal. No wheezing, rales or rhonchi. Abdominal: Soft, nondistended, nontender. Bowel sounds active throughout. There are no masses palpable. No hepatomegaly. Extremities: no edema Lymphadenopathy: No cervical adenopathy noted. Neurological: Alert and oriented to person place and time. Skin: Skin is warm and dry. No rashes noted. Psychiatric: Normal mood and affect. Behavior is  normal.   ASSESSMENT AND PLAN:   5. 54 year old male with several week history of multiple watery stools a day (mainly postprandial). Stool lactoferrin positive implying inflammatory process. Stool culture and C. difficile negative (toxin A/B and GDH).  Etiology of loose stool not clear.   Will treat empirically for infectious etiology.   obtain celiac studies though doubt celiac disease   If diarrhea does not resolve then patient will need colonoscopy, possibly with random biopsies.  Imodium hasn't helped, trial of lomotil   2. CAD  / STEMI 2013, s/p DES. On chronic plavix

## 2014-05-16 NOTE — Op Note (Signed)
[facility] [place], [id number]   COLONOSCOPY PROCEDURE REPORT  PATIENT: Nathaniel [name], Nathaniel [name]  MR#: [id number] BIRTHDATE: [birth year] , 26  yrs. old GENDER: male ENDOSCOPIST: [name], MD PROCEDURE DATE:  05/16/2014 PROCEDURE:   Colonoscopy with biopsy First Screening Colonoscopy - Avg.  risk and is 50 yrs.  old or older - No.  Prior Negative Screening - Now for repeat screening. N/A  History of Adenoma - Now for follow-up colonoscopy & has been > or = to 3 yrs.  Yes hx of adenoma.  Has been 3 or more years since last colonoscopy. ASA CLASS:   Class II INDICATIONS:change in bowels, diarrhea for 8 weeks. MEDICATIONS: Monitored anesthesia care  DESCRIPTION OF PROCEDURE:   After the risks benefits and alternatives of the procedure were thoroughly explained, informed consent was obtained.  The digital rectal exam revealed no abnormalities of the rectum.   The Pentax Ped Colon [id number] endoscope was introduced through the anus and advanced to the terminal ileum which was intubated for a short distance. No adverse events experienced.   The quality of the prep was excellent.  The instrument was then slowly withdrawn as the colon was fully examined.      COLON FINDINGS: The examined terminal ileum appeared to be normal. There was mild diverticulosis noted in the left colon.   The examination was otherwise normal. The colon mucosa was randomly biopsied and sent to pathology.  Retroflexed views revealed no abnormalities. The time to cecum = 2 min      Withdrawal time = 6 min        The scope was withdrawn and the procedure completed. COMPLICATIONS: There were no immediate complications.  ENDOSCOPIC IMPRESSION: 1.   The examined terminal ileum appeared to be normal 2.   Mild diverticulosis was noted in the left colon 3.   The examination was otherwise normal; biopsied to check for microscopic colitis.  RECOMMENDATIONS: Stop other anti-diarrheal  meds and please start imodium (1 pill every AM after waking and 1 pill every night at bedtime). Await final pathology results.  eSigned:  [name], MD 05/16/2014 8:37 AM   cc: Kathlene November, MD

## 2014-05-17 ENCOUNTER — Encounter (HOSPITAL_COMMUNITY): Payer: Self-pay | Admitting: Gastroenterology

## 2014-05-20 LAB — HEPATIC FUNCTION PANEL
ALK PHOS: 71 U/L (ref 25–125)
ALT: 17 U/L (ref 10–40)
AST: 18 U/L (ref 14–40)
BILIRUBIN, TOTAL: 0.7 mg/dL

## 2014-05-20 LAB — BASIC METABOLIC PANEL
BUN: 14 mg/dL (ref 4–21)
Creatinine: 1 mg/dL (ref <=1.3)
GLUCOSE: 116 mg/dL
Potassium: 5.1 mmol/L (ref 3.4–5.3)
SODIUM: 135 mmol/L — AB (ref 137–147)

## 2014-05-20 LAB — LIPID PANEL
CHOLESTEROL: 122 mg/dL (ref 0–200)
HDL: 44 mg/dL (ref 35–70)
LDL Cholesterol: 67 mg/dL
TRIGLYCERIDES: 54 mg/dL (ref 40–160)

## 2014-05-20 LAB — HEMOGLOBIN A1C: Hgb A1c MFr Bld: 6.5 % — AB (ref 4.0–6.0)

## 2014-05-22 ENCOUNTER — Other Ambulatory Visit: Payer: Self-pay

## 2014-05-22 MED ORDER — BUDESONIDE 3 MG PO CP24: 9.0000 mg | ORAL_CAPSULE | Freq: Every day | ORAL | Status: DC

## 2014-05-23 ENCOUNTER — Telehealth: Payer: Self-pay | Admitting: Gastroenterology

## 2014-05-23 MED ORDER — BISMUTH SUBSALICYLATE 262 MG PO TABS: 3.0000 | ORAL_TABLET | Freq: Three times a day (TID) | ORAL | Status: DC

## 2014-05-23 NOTE — Telephone Encounter (Signed)
Dr Ardis Hughs please advise on what the patient can safely take that will not interfere with his blood sugar control.

## 2014-05-23 NOTE — Telephone Encounter (Signed)
Ok, if imodium was working, he should go back to that 1-2 pills twice daily on a scheduled basis.  Alternatively he can try bismuth subsalicylate, 262mg , take 3 pills tid. Disp one month with 3 refills.  Either way, rov with me in 7-8 weeks  Thanks

## 2014-05-23 NOTE — Telephone Encounter (Signed)
Pt was advised of recommendations but states he will try the entocort and check his blood sugars and if he can tolerate the entocort he will take it if not he will go to the imodium and bismuth.  The bismuth was sent to the pharmacy listed and the pt was notified.

## 2014-06-04 ENCOUNTER — Other Ambulatory Visit: Payer: Self-pay | Admitting: Cardiovascular Disease

## 2014-06-12 ENCOUNTER — Other Ambulatory Visit: Payer: Self-pay

## 2014-06-12 MED ORDER — LISINOPRIL 2.5 MG PO TABS: 1.2500 mg | ORAL_TABLET | Freq: Every morning | ORAL | Status: DC

## 2014-06-18 ENCOUNTER — Ambulatory Visit (INDEPENDENT_AMBULATORY_CARE_PROVIDER_SITE_OTHER): Payer: PRIVATE HEALTH INSURANCE | Admitting: Cardiovascular Disease

## 2014-06-18 ENCOUNTER — Encounter: Payer: Self-pay | Admitting: Cardiovascular Disease

## 2014-06-18 VITALS — BP 110/70 | HR 63 | Ht 68.0 in | Wt 182.8 lb

## 2014-06-18 DIAGNOSIS — E785 Hyperlipidemia, unspecified: Secondary | ICD-10-CM

## 2014-06-18 DIAGNOSIS — F17201 Nicotine dependence, unspecified, in remission: Secondary | ICD-10-CM | POA: Diagnosis not present

## 2014-06-18 DIAGNOSIS — I10 Essential (primary) hypertension: Secondary | ICD-10-CM

## 2014-06-18 DIAGNOSIS — I2510 Atherosclerotic heart disease of native coronary artery without angina pectoris: Secondary | ICD-10-CM

## 2014-06-18 NOTE — Patient Instructions (Signed)
Medication Instructions:  Your physician recommends that you continue on your current medications as directed. Please refer to the Current Medication list given to you today.   Labwork: none  Testing/Procedures: none  Follow-Up: Your physician wants you to follow-up in:  12 months.  You will receive a reminder letter in the mail two months in advance. If you don't receive a letter, please call our office to schedule the follow-up appointment.        

## 2014-06-18 NOTE — Progress Notes (Signed)
Chief Complaint  Patient presents with  . Coronary Artery Disease    History of Present Illness: 54 y.o. male with history of DM, HTN, OSA, tobacco abuse, OSA and CAD with anterior STEMI March 2013 who is here today for cardiac follow up. He was admitted 04/17/11-04/19/11 with an anterior STEMI. Emergent LHC 04/17/11: Ostial LAD 100% occluded, mid 50%, mild plaque in the circumflex and RCA, anterior wall and apical hypokinesis with an EF of 45%. PCI: Promus DES to the ostial LAD. He was placed on aspirin and Ticagrelor. He was admitted to [facility] 10/26/11 with chest pain and ruled out for MI. Chest pain was atypical. Outpatient myoview was arranged and showed anterior and apical defect d/w scar and peri-infarct ischemia. Cardiac cath on 11/10/11 with patent LAD stent, mild disease in mid LAD.   He is here today for follow up. Feeling well.  No chest pain or SOB, syncope, orthopnea, PND or significant pedal edema. He has stopped smoking completely. No daily exercise. Blood sugars well controlled. Recent bout with diarrhea and diagnosed with colitis.   Primary Care Physician: Larose Kells  Last Lipid Profile: In Endocrine clinic: 05/20/14: Total chol: 122  HDL: 44  LDL: 67   Past Medical History  Diagnosis Date  . Diabetes mellitus     dx at age 62, not overweight was told he was type 1  . Hyperlipemia   . Sleep apnea     has CPAP does not use  . Depression     has taken meds for depression before  . Ischemic cardiomyopathy     a. EF 45% by V-gram 04/2011;  b. Echo 04/19/2011 EF 55%, Gr 1 DD.  . Tobacco abuse     reports he quit after STEMI  . Chronic headaches     cluster  . CAD (coronary artery disease)     a. ant stemi 04/2011 - totalled LAD treated w/ thrombectomy & 3.5 x 16 Promus DES.  OTW nonobs dzs.  EF 45%    Past Surgical History  Procedure Laterality Date  . Tonsillectomy    . Vasectomy    . Cardiac cath with pci  04/17/2011    DES to LAD  . Left heart catheterization with coronary  angiogram N/A 04/17/2011    Procedure: LEFT HEART CATHETERIZATION WITH CORONARY ANGIOGRAM;  Surgeon: Burnell Blanks, MD;  Location[facility] CATH LAB;  Service: Cardiovascular;  Laterality: N/A;  . Percutaneous coronary stent intervention (pci-s)  04/17/2011    Procedure: PERCUTANEOUS CORONARY STENT INTERVENTION (PCI-S);  Surgeon: Burnell Blanks, MD;  Location: [facility] CATH LAB;  Service: Cardiovascular;;  . Colonoscopy with propofol N/A 05/16/2014    Procedure: COLONOSCOPY WITH PROPOFOL;  Surgeon: [name], MD;  Location: [facility] ENDOSCOPY;  Service: Endoscopy;  Laterality: N/A;    Current Outpatient Prescriptions  Medication Sig Dispense Refill  . aspirin EC 81 MG tablet Take 81 mg by mouth every morning.     Marland Kitchen atorvastatin (LIPITOR) 20 MG tablet TAKE 1 TABLET DAILY 90 tablet 1  . budesonide (ENTOCORT EC) 3 MG 24 hr capsule Take 3 capsules (9 mg total) by mouth daily. 90 capsule 3  . carvedilol (COREG) 3.125 MG tablet Take 3.125 mg by mouth 2 (two) times daily with a meal.    . clopidogrel (PLAVIX) 75 MG tablet TAKE 1 TABLET (75 MG TOTAL) BY MOUTH DAILY. (Patient taking differently: Take one tablet by mouth nightly.) 90 tablet 2  . GLUCAGON EMERGENCY 1 MG injection     .  Insulin Human (INSULIN PUMP) SOLN Inject into the skin. Novolog    . lisinopril (PRINIVIL,ZESTRIL) 2.5 MG tablet Take 0.5 tablets (1.25 mg total) by mouth every morning. 90 tablet 0  . nitroGLYCERIN (NITROSTAT) 0.4 MG SL tablet Place 0.4 mg under the tongue every 5 (five) minutes as needed for chest pain.    Marland Kitchen NOVOLOG 100 UNIT/ML injection Use as directed per sliding scale (insulin pump)    . ONE TOUCH ULTRA TEST test strip USE AS NEEDED (Patient taking differently: Use to test blood sugar four to six times daily.) 100 each 1   No current facility-administered medications for this visit.    No Known Allergies  History   Social History  . Marital Status: Married    Spouse Name: N/A  . Number of Children: 1  . Years  of Education: N/A   Occupational History  .    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 Smoking status: Former Smoker -- 0.50 packs/day for 15 years    Types: Cigarettes    Quit date: 04/17/2011  . Smokeless tobacco: Never Used  . Alcohol Use: 3.0 - 3.6 oz/week    5-6 Shots of liquor per week     Comment: weekends only  . Drug Use: No  . Sexual Activity: Not Currently   Other Topics Concern  . Not on file   Social History Narrative   Moved from [place] on July 2010---   Regular exercise: started going to the gym ~ 09-2011-----   Lives by himself----     Family History  Problem Relation Age of Onset  . Multiple sclerosis Mother   . Parkinsonism Father   . Stroke Father   . Coronary artery disease Neg Hx   . Hypertension Neg Hx   . Diabetes Neg Hx   . Colon cancer Neg Hx   . Prostate cancer Neg Hx     Review of Systems:  As stated in the HPI and otherwise negative.   BP 110/70 mmHg  Pulse 63  Ht 5\' 8"  (1.727 m)  Wt 182 lb 12.8 oz (82.918 kg)  BMI 27.80 kg/m2  Physical Examination: General: Well developed, well nourished, NAD HEENT: OP clear, mucus membranes moist SKIN: warm, dry. No rashes. Neuro: No focal deficits Musculoskeletal: Muscle strength 5/5 all ext Psychiatric: Mood and affect normal Neck: No JVD, no carotid bruits, no thyromegaly, no lymphadenopathy. Lungs:Clear bilaterally, no wheezes, rhonci, crackles Cardiovascular: Regular rate and rhythm. No murmurs, gallops or rubs. Abdomen:Soft. Bowel sounds present. Non-tender.  Extremities: No lower extremity edema. Pulses are 2 + in the bilateral DP/PT.  EKG:  EKG is not ordered today. The ekg ordered today demonstrates   Recent Labs: 04/08/2014: Hemoglobin 14.4; Platelets 276.0; TSH 0.93 05/20/2014: ALT 17; BUN 14; Creatinine 1.0; Potassium 5.1; Sodium 135*   Lipid Panel    Component Value Date/Time   CHOL 122 05/20/2014   TRIG 54 05/20/2014   HDL 44 05/20/2014   CHOLHDL  1.8 10/27/2011 0544   VLDL 10 10/27/2011 0544   LDLCALC 67 05/20/2014   LDLDIRECT 140.0 08/25/2010 1108     Wt Readings from Last 3 Encounters:  06/18/14 182 lb 12.8 oz (82.918 kg)  05/16/14 180 lb (81.647 kg)  04/19/14 182 lb 2 oz (82.611 kg)     Other studies Reviewed: Additional studies/ records that were reviewed today include: . Review of the above records demonstrates:    Assessment and Plan:  1. CAD:  Stable. Follow up cath 11/10/11 with patent LAD stent. He is feeling well. He is on ASA/Plavix/statin/beta blocker/Ace-inh. No medication changes today. Resume exercise. I spent 5 minutes reviewing the need for daily exercise.   2. HTN: BP well controlled. No changes.   3. Tobacco abuse, in remission: He has stopped smoking.   4. Hyperlipidemia: Continue statin. Lipids controlled.   Current medicines are reviewed at length with the patient today.  The patient does not have concerns regarding medicines.  The following changes have been made:  no change  Labs/ tests ordered today include:  No orders of the defined types were placed in this encounter.    Disposition:   FU with me in 12  months  Signed, Lauree Chandler, MD 06/18/2014 4:29 PM    [address], [place], [place]  [address] Phone: [phone]; Fax: [phone]

## 2014-07-16 ENCOUNTER — Ambulatory Visit (INDEPENDENT_AMBULATORY_CARE_PROVIDER_SITE_OTHER): Payer: PRIVATE HEALTH INSURANCE | Admitting: Gastroenterology

## 2014-07-16 ENCOUNTER — Encounter: Payer: Self-pay | Admitting: Gastroenterology

## 2014-07-16 VITALS — BP 116/82 | HR 62 | Ht 67.5 in | Wt 185.0 lb

## 2014-07-16 DIAGNOSIS — K5289 Other specified noninfective gastroenteritis and colitis: Secondary | ICD-10-CM | POA: Diagnosis not present

## 2014-07-16 DIAGNOSIS — K52832 Lymphocytic colitis: Secondary | ICD-10-CM

## 2014-07-16 NOTE — Progress Notes (Signed)
Review of pertinent gastrointestinal problems: 1. Adenomatous polyps: colonoscopy by Dr. Ardis Hughs August 2012 with findings of a sigmoid pedunculated polyp. Path compatible with tubular adenoma , no high-grade dysplasia 2. Lymphocytic colitis: labs 2016 cbc, cmet, stool culture, c diff celiac panel all negative.  Fecal lactoferin positive.  Colonoscopy 05/2014 Dr. Ardis Hughs found normal TI, normal colon except diverticulosis, random biopsies + for lymphocytic colitis.   He started budesonide 3 pills once daily and within 10-12 days symptoms dramatically improved. He stopped after 30 day course.  HPI:x This is a  very pleasant 54 year old man whom I last saw about 2 months ago  Chief complaint is lymphocytic colitis  He started taking budesonide 3 pills once daily, took a month and then stopped completely, did not refill because he felt so well   Past Medical History  Diagnosis Date  . Diabetes mellitus     dx at age 70, not overweight was told he was type 1  . Hyperlipemia   . Sleep apnea     has CPAP does not use  . Depression     has taken meds for depression before  . Ischemic cardiomyopathy     a. EF 45% by V-gram 04/2011;  b. Echo 04/19/2011 EF 55%, Gr 1 DD.  . Tobacco abuse     reports he quit after STEMI  . Chronic headaches     cluster  . CAD (coronary artery disease)     a. ant stemi 04/2011 - totalled LAD treated w/ thrombectomy & 3.5 x 16 Promus DES.  OTW nonobs dzs.  EF 45%    Past Surgical History  Procedure Laterality Date  . Tonsillectomy    . Vasectomy    . Cardiac cath with pci  04/17/2011    DES to LAD  . Left heart catheterization with coronary angiogram N/A 04/17/2011    Procedure: LEFT HEART CATHETERIZATION WITH CORONARY ANGIOGRAM;  Surgeon: Burnell Blanks, MD;  Location: [facility] CATH LAB;  Service: Cardiovascular;  Laterality: N/A;  . Percutaneous coronary stent intervention (pci-s)  04/17/2011    Procedure: PERCUTANEOUS CORONARY STENT INTERVENTION (PCI-S);   Surgeon: Burnell Blanks, MD;  Location: [facility] CATH LAB;  Service: Cardiovascular;;  . Colonoscopy with propofol N/A 05/16/2014    Procedure: COLONOSCOPY WITH PROPOFOL;  Surgeon: [name], MD;  Location: [facility] ENDOSCOPY;  Service: Endoscopy;  Laterality: N/A;    Current Outpatient Prescriptions  Medication Sig Dispense Refill  . aspirin EC 81 MG tablet Take 81 mg by mouth every morning.     Marland Kitchen atorvastatin (LIPITOR) 20 MG tablet TAKE 1 TABLET DAILY 90 tablet 1  . carvedilol (COREG) 3.125 MG tablet Take 3.125 mg by mouth 2 (two) times daily with a meal.    . clopidogrel (PLAVIX) 75 MG tablet TAKE 1 TABLET (75 MG TOTAL) BY MOUTH DAILY. (Patient taking differently: Take one tablet by mouth nightly.) 90 tablet 2  . GLUCAGON EMERGENCY 1 MG injection     . Insulin Human (INSULIN PUMP) SOLN Inject into the skin. Novolog    . lisinopril (PRINIVIL,ZESTRIL) 2.5 MG tablet Take 0.5 tablets (1.25 mg total) by mouth every morning. 90 tablet 0  . nitroGLYCERIN (NITROSTAT) 0.4 MG SL tablet Place 0.4 mg under the tongue every 5 (five) minutes as needed for chest pain.    Marland Kitchen NOVOLOG 100 UNIT/ML injection Use as directed per sliding scale (insulin pump)    . ONE TOUCH ULTRA TEST test strip USE AS NEEDED (Patient taking differently: Use to test  blood sugar four to six times daily.) 100 each 1   No current facility-administered medications for this visit.    Allergies as of 07/16/2014  . (No Known Allergies)    Family History  Problem Relation Age of Onset  . Multiple sclerosis Mother   . Parkinsonism Father   . Stroke Father   . Coronary artery disease Neg Hx   . Hypertension Neg Hx   . Diabetes Neg Hx   . Colon cancer Neg Hx   . Prostate cancer Neg Hx     History   Social History  . Marital Status: Married    Spouse Name: N/A  . Number of Children: 1  . Years of Education: N/A   Occupational History  .    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 Smoking  status: Former Smoker -- 0.50 packs/day for 15 years    Types: Cigarettes    Quit date: 04/17/2011  . Smokeless tobacco: Never Used  . Alcohol Use: 3.0 - 3.6 oz/week    5-6 Shots of liquor per week     Comment: weekends only  . Drug Use: No  . Sexual Activity: Not Currently   Other Topics Concern  . Not on file   Social History Narrative   Moved from [place] on July 2010---   Regular exercise: started going to the gym ~ 09-2011-----   Lives by himself----      Physical Exam: BP 116/82 mmHg  Pulse 62  Ht 5' 7.5" (1.715 m)  Wt 185 lb (83.915 kg)  BMI 28.53 kg/m2 Constitutional: generally well-appearing Psychiatric: alert and oriented x3 Abdomen: soft, nontender, nondistended, no obvious ascites, no peritoneal signs, normal bowel sounds   Assessment and plan: 54 y.o. male with lymphocytic colitis in clinical remission  Budesonide was clearly effective for his lymphocytic colitis. He stopped after one month. That was about 3 weeks ago and his symptoms have not returned. I recommended that if significant diarrhea returns in the next few months he should resume budesonide 3 pills once daily and then also call our office for further instructions. He does understand this can be a chronic intermittent issue that I suspect the next 2-3 years he will have relapse. Fortunately budesonide seems to be very effective for his disease.   Owens Loffler, MD [facility] 07/16/2014, 3:29 PM

## 2014-07-16 NOTE — Patient Instructions (Signed)
Refill your entocort and restart 3 pills once daily if significant diarrhea improves and also CALL our office for further recommendation.

## 2014-07-30 ENCOUNTER — Other Ambulatory Visit: Payer: Self-pay | Admitting: Cardiovascular Disease

## 2014-09-03 ENCOUNTER — Telehealth: Payer: Self-pay | Admitting: Cardiovascular Disease

## 2014-09-03 DIAGNOSIS — G4719 Other hypersomnia: Secondary | ICD-10-CM

## 2014-09-03 DIAGNOSIS — R0683 Snoring: Secondary | ICD-10-CM

## 2014-09-03 NOTE — Telephone Encounter (Signed)
Thanks

## 2014-09-03 NOTE — Telephone Encounter (Signed)
New Message        Pt calling wanting Dr. Angelena Form to recommend a sleep specialist to him because he has sleep apnea. Please call back and advise.

## 2014-09-03 NOTE — Telephone Encounter (Signed)
Can we refer him to see Dr. Radford Pax in our office? Can we check and make sure she is taking new patients for sleep apnea? Thanks, chris

## 2014-09-03 NOTE — Telephone Encounter (Signed)
Spoke with pt and informed him that Dr. Angelena Form would like to refer him to Dr. Radford Pax here in our office. Dr. Radford Pax is currently taking new sleep pts. Dr. Radford Pax said pt would need split night sleep study ordered prior to scheduling appt. Placed order for split night sleep study and informed pt that someone would call him to schedule this. Pt verbalized understanding.

## 2014-09-03 NOTE — Telephone Encounter (Signed)
Spoke with pt and he states that he would like for Dr. Angelena Form to recommend or refer him to a sleep specialist for his sleep apnea. Pt states that he was diagnosed with it several years ago and was unable to tolerate the CPAP machine at that time. Pt states that he would really like to try again because he has daytime fatigue. Pt states that he does snore. Informed pt that I would route this information to Dr. Angelena Form for review and advisement. Pt verbalized understanding and was in agreement.

## 2014-09-03 NOTE — Telephone Encounter (Signed)
Routing prior phone note. cdm

## 2014-09-10 ENCOUNTER — Ambulatory Visit (HOSPITAL_BASED_OUTPATIENT_CLINIC_OR_DEPARTMENT_OTHER): Payer: No Typology Code available for payment source | Attending: Cardiovascular Disease | Admitting: Radiology

## 2014-09-10 DIAGNOSIS — G478 Other sleep disorders: Secondary | ICD-10-CM | POA: Insufficient documentation

## 2014-09-10 DIAGNOSIS — R0683 Snoring: Secondary | ICD-10-CM | POA: Insufficient documentation

## 2014-09-10 DIAGNOSIS — G4733 Obstructive sleep apnea (adult) (pediatric): Secondary | ICD-10-CM | POA: Diagnosis present

## 2014-09-10 DIAGNOSIS — G4719 Other hypersomnia: Secondary | ICD-10-CM

## 2014-09-22 NOTE — Sleep Study (Signed)
NAME: [name] DATE OF BIRTH:  [birth year] MEDICAL RECORD NUMBER [id number]  LOCATION: [facility]  PHYSICIAN: Dulcemaria Bula A  DATE OF STUDY: 09/10/2014  SLEEP STUDY TYPE: Nocturnal Polysomnogram               REFERRING PHYSICIAN: Burnell Blanks* Patient Name: Nathaniel [name], Nathaniel [name] Date: 09/10/2014 Gender: Male D.O.B: 04-11-1960 Age (years): 48 Referring Provider: Burnell Blanks Height (inches): 53 Interpreting Physician: Shelva Majestic MD, ABSM Weight (lbs): 180 RPSGT: Laren Everts BMI: 27 MRN: [id number] Neck Size: 15.50   Indication for sleep study: Diabetes, Excessive Daytime Sleepiness, Fatigue, OSA, Snoring  Epworth Sleepiness Score: 4  SLEEP STUDY TECHNIQUE As per the AASM Manual for the Scoring of Sleep and Associated Events v2.3 (April 2016) with a hypopnea requiring 4% desaturations.  The channels recorded and monitored were frontal, central and occipital EEG, electrooculogram (EOG), submentalis EMG (chin), nasal and oral airflow, thoracic and abdominal wall motion, anterior tibialis EMG, snore microphone, electrocardiogram, and pulse oximetry.  MEDICATIONS  aspirin EC 81 MG tablet 81 mg, Every morning - 10a     atorvastatin (LIPITOR) 20 MG tablet      carvedilol (COREG) 3.125 MG tablet 3.125 mg, 2 times daily with meals     carvedilol (COREG) 3.125 MG tablet      clopidogrel (PLAVIX) 75 MG tablet      Patient taking differently: Take one tablet by mouth nightly., Reason: Free Text Sig Edit, Informant: Self, Reported on 04/26/2014   GLUCAGON EMERGENCY 1 MG injection      Note: On Hand (Written 04/19/2014 0925)   Insulin Human (INSULIN PUMP) SOLN      lisinopril (PRINIVIL,ZESTRIL) 2.5 MG tablet 1.25 mg, Every morning - 10a     nitroGLYCERIN (NITROSTAT) 0.4 MG SL tablet 0.4 mg, Every 5 min PRN     NOVOLOG 100 UNIT    Medications self-administered by patient during sleep study : No sleep medicine  administered.  SLEEP ARCHITECTURE The study was initiated at 10:18:20 PM and ended at 4:51:57 AM.  Sleep onset time was 10.2 minutes and the sleep efficiency was 72.9%. The total sleep time was 287.0 minutes.  Wake after sleep onset (WASO): 96.4 minutes  Stage REM latency was 109.5 minutes.  The patient spent 19.16% of the night in stage N1 sleep, 63.41% in stage N2 sleep, 0.00% in stage N3 and 17.42% in REM.  Alpha intrusion was absent.  Supine sleep was 22.11%.  RESPIRATORY PARAMETERS The overall apnea/hypopnea index (AHI) was 2.1 per hour. There were 3 total apneas, including 0 obstructive, 3 central and 0 mixed apneas. There were 7 hypopneas and 100 RERAs.  The AHI during Stage REM sleep was 3.6 per hour.  AHI while supine was 6.6 per hour.  The mean oxygen saturation was 96.07%. The minimum SpO2 during sleep was 91.00%.  Moderate snoring was noted during this study.  CARDIAC DATA The 2 lead EKG demonstrated sinus rhythm. The mean heart rate was 51.36 beats per minute. Other EKG findings include: None.  LEG MOVEMENT DATA The total PLMS were 0 with a resulting PLMS index of 0.00. Associated arousal with leg movement index was 0.0 .  IMPRESSION Increased Upper Airway Resistance Syndrome (UARS) without significant obstructive sleep apnea (AHI = 2.1/h). No significant central sleep apnea occurred during this study (CAI = 0.6/h). Reduced sleep efficiency. No significant oxygen desaturation. (Min O2 = 91.00%). The patient snored with Moderate snoring volume. No cardiac abnormalities were noted during this study.  Clinically significant periodic limb movements did not occur during sleep. No significant associated arousals.  DIAGNOSIS Moderate snoring Increased upper Airway Resistance  RECOMMENDATIONS At present there is no indication for CPAP therapy. Consider alternatives for the treatment of moderate snoring. Avoid alcohol, sedatives and other CNS depressants that may  worsen sleep apnea and disrupt normal sleep architecture. Sleep hygiene should be reviewed to assess factors that may improve sleep quality. Weight management and regular exercise should be initiated or continued if appropriate. With mild mild sleep apnea with supine sleep (AHI 6.6/hr) patient should be counseled to try to reduce supine sleep.    [place], American Board of Sleep Medicine  ELECTRONICALLY SIGNED ON:  09/22/2014, 2:00 PM [place] PH: [phone]   FX: [phone] [place]

## 2014-09-30 ENCOUNTER — Telehealth: Payer: Self-pay | Admitting: *Deleted

## 2014-09-30 NOTE — Telephone Encounter (Signed)
Reviewed sleep study with Dr. Radford Pax.  She recommends pt see Dr. Claiborne Billings for follow up as study was read by him.  I spoke with pt and reviewed sleep study results and study recommendations with him.  Pt reports he is still having trouble sleeping and would like to see sleep specialist.  Will have schedulers contact him for appt with Dr. Claiborne Billings.

## 2014-10-08 ENCOUNTER — Ambulatory Visit (INDEPENDENT_AMBULATORY_CARE_PROVIDER_SITE_OTHER): Payer: PRIVATE HEALTH INSURANCE | Admitting: Cardiovascular Disease

## 2014-10-08 ENCOUNTER — Encounter: Payer: Self-pay | Admitting: Cardiovascular Disease

## 2014-10-08 VITALS — BP 112/74 | HR 58 | Ht 68.0 in | Wt 186.0 lb

## 2014-10-08 DIAGNOSIS — G478 Other sleep disorders: Secondary | ICD-10-CM

## 2014-10-08 DIAGNOSIS — I2510 Atherosclerotic heart disease of native coronary artery without angina pectoris: Secondary | ICD-10-CM

## 2014-10-08 DIAGNOSIS — E785 Hyperlipidemia, unspecified: Secondary | ICD-10-CM

## 2014-10-08 DIAGNOSIS — I2583 Coronary atherosclerosis due to lipid rich plaque: Secondary | ICD-10-CM

## 2014-10-08 DIAGNOSIS — G4700 Insomnia, unspecified: Secondary | ICD-10-CM | POA: Diagnosis not present

## 2014-10-08 NOTE — Patient Instructions (Signed)
Your physician recommends that you schedule a follow-up appointment with dr. Claiborne Billings if needed. No changes were made today in your therapy.

## 2014-10-10 ENCOUNTER — Encounter: Payer: Self-pay | Admitting: Cardiovascular Disease

## 2014-10-10 DIAGNOSIS — E785 Hyperlipidemia, unspecified: Secondary | ICD-10-CM | POA: Insufficient documentation

## 2014-10-10 DIAGNOSIS — G478 Other sleep disorders: Secondary | ICD-10-CM | POA: Insufficient documentation

## 2014-10-10 NOTE — Progress Notes (Signed)
Patient ID: Nathaniel [name], male   DOB: [birth year], 54 y.o.   MRN: [id number]     HPI: Nathaniel [name] is a 54 y.o. male who is followed by Dr. Larose Kells and Julianne Handler.  His referred for sleep evaluation following his most recent sleep study.  Nathaniel [name] admits to a history of mild to moderate obstructive sleep apnea which initially was diagnosed while he lived in [place] approximately 8 years ago.  He states he did use CPAP therapy for approximately 2-3 months, but ultimately discontinued this.  He has a history of diabetes mellitus, hypertension, and has known CAD.  He suffered an anterior wall ST segment elevation myocardial infarction in March 2013 secondary to ostial LAD occlusion for which he underwent DES stenting with a Promus stent to the ostial LAD.  He has been found to have anterior and apical defect on Myoview study consistent with scar.  He recently was referred for a follow-up sleep study which was done at [facility] long sleep lab on 09/10/2014.  At that time, he did not have significant excessive daytime sleepiness and his Epworth Sleepiness Scale score was normal at 4.  He was not found to have significant obstructive sleep apnea and his AHI was only 2.1/h with a total of 3.  T hypopneas, and 7 hypopnea is.  He had 3 central events.  He had 100.  Respiratory effort related arousals.  His AHI during rems sleep was only 3.6 per hour.  He did have mild positional component with an AHI with supine sleep at 6.6 per hour, consistent with mild sleep apnea.  He did not have any significant oxygen desaturation and his minimum oxygen saturations during sleep was only 91%.  He was noted to have moderate snoring throughout the study.  He was felt that he most likely  had increased upper airway resistance syndrome (UARS) without significant obstructive sleep apnea.  He presents for sleep evaluation.  He typically goes to bed between 9:30 and 10 PM and wakes up at 5:30 AM.  He wakes up approximately 3 times  per night.  He does not have difficulty with sleep initiation but seems to have some sleep maintenance issues. He denies any recent chest pain.  He denies palpitations.  He denies presyncope or syncope.  He denies restless legs.  He denies bruxism.  There is no hypnogagnic hallucinations.  He denies excessive daytime sleepiness.   Epworth Sleepiness Scale: Situation   Chance of Dozing/Sleeping (0 = never , 1 = slight chance , 2 = moderate chance , 3 = high chance )   sitting and reading 1   watching TV 1   sitting inactive in a public place 0   being a passenger in a motor vehicle for an hour or more 0   lying down in the afternoon 1   sitting and talking to someone 0   sitting quietly after lunch (no alcohol) 0   while stopped for a few minutes in traffic as the driver 0   Total Score  3    Past Medical History  Diagnosis Date  . Diabetes mellitus     dx at age 59, not overweight was told he was type 1  . Hyperlipemia   . Sleep apnea     has CPAP does not use  . Depression     has taken meds for depression before  . Ischemic cardiomyopathy     a. EF 45% by V-gram 04/2011;  b. Echo 04/19/2011 EF  55%, Gr 1 DD.  . Tobacco abuse     reports he quit after STEMI  . Chronic headaches     cluster  . CAD (coronary artery disease)     a. ant stemi 04/2011 - totalled LAD treated w/ thrombectomy & 3.5 x 16 Promus DES.  OTW nonobs dzs.  EF 45%    Past Surgical History  Procedure Laterality Date  . Tonsillectomy    . Vasectomy    . Cardiac cath with pci  04/17/2011    DES to LAD  . Left heart catheterization with coronary angiogram N/A 04/17/2011    Procedure: LEFT HEART CATHETERIZATION WITH CORONARY ANGIOGRAM;  Surgeon: Burnell Blanks, MD;  Location: [facility] CATH LAB;  Service: Cardiovascular;  Laterality: N/A;  . Percutaneous coronary stent intervention (pci-s)  04/17/2011    Procedure: PERCUTANEOUS CORONARY STENT INTERVENTION (PCI-S);  Surgeon: Burnell Blanks, MD;  Location: [facility]  CATH LAB;  Service: Cardiovascular;;  . Colonoscopy with propofol N/A 05/16/2014    Procedure: COLONOSCOPY WITH PROPOFOL;  Surgeon: [name], MD;  Location: [facility] ENDOSCOPY;  Service: Endoscopy;  Laterality: N/A;    No Known Allergies  Current Outpatient Prescriptions  Medication Sig Dispense Refill  . aspirin EC 81 MG tablet Take 81 mg by mouth every morning.     Marland Kitchen atorvastatin (LIPITOR) 20 MG tablet TAKE 1 TABLET DAILY 90 tablet 1  . carvedilol (COREG) 3.125 MG tablet Take 3.125 mg by mouth 2 (two) times daily with a meal.    . carvedilol (COREG) 3.125 MG tablet TAKE 1 TABLET TWICE A DAY WITH MEALS 180 tablet 3  . clopidogrel (PLAVIX) 75 MG tablet TAKE 1 TABLET (75 MG TOTAL) BY MOUTH DAILY. (Patient taking differently: Take one tablet by mouth nightly.) 90 tablet 2  . GLUCAGON EMERGENCY 1 MG injection     . Insulin Human (INSULIN PUMP) SOLN Inject into the skin. Novolog    . lisinopril (PRINIVIL,ZESTRIL) 2.5 MG tablet Take 0.5 tablets (1.25 mg total) by mouth every morning. 90 tablet 0  . nitroGLYCERIN (NITROSTAT) 0.4 MG SL tablet Place 0.4 mg under the tongue every 5 (five) minutes as needed for chest pain.    Marland Kitchen NOVOLOG 100 UNIT/ML injection Use as directed per sliding scale (insulin pump)    . ONE TOUCH ULTRA TEST test strip USE AS NEEDED (Patient taking differently: Use to test blood sugar four to six times daily.) 100 each 1   No current facility-administered medications for this visit.    Social History   Social History  . Marital Status: Married    Spouse Name: N/A  . Number of Children: 1  . Years of Education: N/A   Occupational History  .    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 Smoking status: Former Smoker -- 0.50 packs/day for 15 years    Types: Cigarettes    Quit date: 04/17/2011  . Smokeless tobacco: Never Used  . Alcohol Use: 3.0 - 3.6 oz/week    5-6 Shots of liquor per week     Comment: weekends only  . Drug Use: No  . Sexual  Activity: Not Currently   Other Topics Concern  . Not on file   Social History Narrative   Moved from [place] on July 2010---   Regular exercise: started going to the gym ~ 09-2011-----   Lives by himself----     Family History  Problem Relation Age of Onset  .  Multiple sclerosis Mother   . Parkinsonism Father   . Stroke Father   . Coronary artery disease Neg Hx   . Hypertension Neg Hx   . Diabetes Neg Hx   . Colon cancer Neg Hx   . Prostate cancer Neg Hx      ROS General: Negative; No fevers, chills, or night sweats HEENT: Negative; No changes in vision or hearing, sinus congestion, difficulty swallowing Pulmonary: Negative; No cough, wheezing, shortness of breath, hemoptysis Cardiovascular: Negative; No chest pain, presyncope, syncope, palpatations GI: Negative; No nausea, vomiting, diarrhea, or abdominal pain GU: Negative; No dysuria, hematuria, or difficulty voiding Musculoskeletal: Negative; no myalgias, joint pain, or weakness Hematologic: Negative; no easy bruising, bleeding Endocrine: Negative; no heat/cold intolerance Neuro: Negative; no changes in balance, headaches Skin: Negative; No rashes or skin lesions Psychiatric: Negative; No behavioral problems, depression Sleep: Negative; No daytime sleepiness, hypersomnolence, bruxism, restless legs, hypnogognic hallucinations, no cataplexy   Physical Exam BP 112/74 mmHg  Pulse 58  Ht 5\' 8"  (1.727 m)  Wt 186 lb (84.369 kg)  BMI 28.29 kg/m2  Wt Readings from Last 3 Encounters:  10/08/14 186 lb (84.369 kg)  09/10/14 180 lb (81.647 kg)  07/16/14 185 lb (83.915 kg)   General: Alert, oriented, no distress.  Skin: normal turgor, no rashes HEENT: Normocephalic, atraumatic. Pupils round and reactive; sclera anicteric; extraocular muscles intact;  Nose without nasal septal hypertrophy Mouth/Parynx benign; Mallinpatti scale 3 Neck: No JVD, no carotid briuts Lungs: clear to ausculatation and percussion; no wheezing or  rales  Chest wall: No tenderness to palpation Heart: RRR, s1 s2 normal  Abdomen: soft, nontender; no hepatosplenomehaly, BS+; abdominal aorta nontender and not dilated by palpation. Back: No CVA tenderness Pulses 2+ Extremities: no clubbinbg cyanosis or edema, Homan's sign negative  Neurologic: grossly nonfocal; cranial nerves intact. Psychological: Normal affect and mood.  ECG (independently read by me): Not done today  LABS:  BMP Latest Ref Rng 05/20/2014 04/08/2014 11/10/2011  Glucose 70 - 99 mg/dL - 76 01/10/2012)  BUN 4 - 21 mg/dL 14 21 -  Creatinine .6 - 1.3 mg/dL 1.0 513(I -  Sodium 5.99 - 147 mmol/L 135(A) 141 -  Potassium 3.4 - 5.3 mmol/L 5.1 4.3 -  Chloride 96 - 112 mEq/L - 109 -  CO2 19 - 32 mEq/L - 27 -  Calcium 8.4 - 10.5 mg/dL - 9.4 -     Hepatic Function Latest Ref Rng 05/20/2014 04/08/2014 04/17/2011  Total Protein 6.0 - 8.3 g/dL - 7.4 7.4  Albumin 3.5 - 5.2 g/dL - 4.3 4.4  AST 14 - 40 U/L 18 19 93(H)  ALT 10 - 40 U/L 17 19 27   Alk Phosphatase 25 - 125 U/L 71 77 83  Total Bilirubin 0.2 - 1.2 mg/dL - 0.6 0.4     CBC Latest Ref Rng 04/08/2014 11/04/2011 10/26/2011  WBC 4.0 - 10.5 K/uL 3.9(L) 5.5 4.9  Hemoglobin 13.0 - 17.0 g/dL 11/06/2011 12.6(L) 13.4  Hematocrit 39.0 - 52.0 % 42.3 38.7(L) 38.3(L)  Platelets 150.0 - 400.0 K/uL 276.0 213.0 234     Lipid Panel     Component Value Date/Time   CHOL 122 05/20/2014   TRIG 54 05/20/2014   HDL 44 05/20/2014   CHOLHDL 1.8 10/27/2011 0544   VLDL 10 10/27/2011 0544   LDLCALC 67 05/20/2014   LDLDIRECT 140.0 08/25/2010 1108     RADIOLOGY: No results found.    ASSESSMENT AND PLAN: Nathaniel [name] is a 54 year old gentleman who has significant cardiac  history and is status postterior ST segment elevation MI with DES stenting to his ostial LAD.  He has a history of diabetes mellitus and hypertension.  Apparently he was told of having mild to moderate sleep apnea approximately 8 years ago while he lived in [place].  I  reviewed his current sleep study in detail.  On his current study.  He does not meet criteria for obstructive sleep apnea but most likely has increased upper airway resistance syndrome (UARS).  Reviewed his sleep study in detail.  He does not have any residual daytime sleepiness and his Epworth Sleepiness Scale score is normal.  He does not have difficulty sleeping initiation but at times has had issues continuing with sleep maintenance.  At present, he does not meet criteria for CPAP therapy.  He did have mild sleep apnea with supine sleep.  I discussed the importance of potential positional and avoidance of supine sleep.  Sleep maintenance continues to be an issue.  He may potentially benefit from sleep aid therapy with extended release zolpiden or Lunesta..  He does have moderate snoring.  If this continues ENT evaluation may be worthwhile.  We discussed sleep hygiene and I provided him with information regarding this.  I will see him on an as-needed basis if progressive symptoms develop.  Time spent: 35 minutes  Troy Sine, MD, [facility]  10/10/2014 7:40 PM

## 2014-10-23 ENCOUNTER — Telehealth: Payer: Self-pay | Admitting: Gastroenterology

## 2014-10-23 NOTE — Telephone Encounter (Signed)
Pt states he is having a flare of his microscopic colitis and needs to be seen sooner than 1st available. Pt scheduled to see Alonza Bogus PA Friday at 2pm. Pt aware of appt.

## 2014-10-25 ENCOUNTER — Encounter: Payer: Self-pay | Admitting: Gastroenterology

## 2014-10-25 ENCOUNTER — Ambulatory Visit (INDEPENDENT_AMBULATORY_CARE_PROVIDER_SITE_OTHER): Payer: PRIVATE HEALTH INSURANCE | Admitting: Gastroenterology

## 2014-10-25 VITALS — BP 110/70 | HR 68 | Ht 67.5 in | Wt 184.1 lb

## 2014-10-25 DIAGNOSIS — R197 Diarrhea, unspecified: Secondary | ICD-10-CM | POA: Diagnosis not present

## 2014-10-25 DIAGNOSIS — K5289 Other specified noninfective gastroenteritis and colitis: Secondary | ICD-10-CM | POA: Diagnosis not present

## 2014-10-25 DIAGNOSIS — K52839 Microscopic colitis, unspecified: Secondary | ICD-10-CM | POA: Insufficient documentation

## 2014-10-25 NOTE — Progress Notes (Signed)
     10/25/2014 Nathaniel [name] [id number] [birth year]   1. Adenomatous polyps: colonoscopy by Dr. Ardis Hughs August 2012 with findings of a sigmoid pedunculated polyp. Path compatible with tubular adenoma , no high-grade dysplasia 2. Lymphocytic colitis: labs 2016 cbc, cmet, stool culture, c diff celiac panel all negative.  Fecal lactoferin positive.  Colonoscopy 05/2014 Dr. Ardis Hughs found normal TI, normal colon except diverticulosis, random biopsies + for lymphocytic colitis.    He started budesonide 3 pills once daily and within 10-12 days symptoms dramatically improved. He stopped after 30 day course. Diarrhea recurred in August 2016 at which time he reinitiated therapy with budesonide 9 mg daily.   History of Present Illness:  Patient is here with recurrent complaints of diarrhea.  As stated above, stopped budesonide prematurely after only a month of treatment.  Diarrhea has been present again since some time in August.  Back on budesonide 9 mg daily for about one month.  Has had decrease in stool frequency from 4-6 stools daily to 3-4 daily, but they are still loose.  Denies abdominal pain, bleeding, and other symptoms.   Current Medications, Allergies, Past Medical History, Past Surgical History, Family History and Social History were reviewed in [employer] record.   Physical Exam: BP 110/70 mmHg  Pulse 68  Ht 5' 7.5" (1.715 m)  Wt 184 lb 2 oz (83.519 kg)  BMI 28.40 kg/m2 General: Well developed white male in no acute distress Head: Normocephalic and atraumatic Eyes:  sclerae anicteric, conjunctiva pink  Ears: Normal auditory acuity Lungs: Clear throughout to auscultation Heart: Regular rate and rhythm Abdomen: Soft, non-distended.  Normal bowel sounds.  Non-tender. Musculoskeletal: Symmetrical with no gross deformities  Extremities: No edema  Neurological: Alert oriented x 4, grossly non-focal Psychological:  Alert and cooperative. Normal mood and  affect  Assessment and Recommendations: -Lymphocytic colitis:  Would continue budesonide 9 mg for now since he has only been back on it for one month.  Also offered for him to use Imodium prn as well in the interim.  I think that he has somewhat unrealistic expectations for how quickly he should respond to the medication.  If no improvement after several weeks then could consider other options such as Pepto, prednisone, mesalamine, etc.  If he does respond then budesonide should be tapered over longer course of time instead of discontinuing abruptly after short course as he did previously.

## 2014-10-25 NOTE — Patient Instructions (Signed)
Please continue your Budesonide 9 mg daily.   Follow up with Dr. Ardis Hughs in two months.

## 2014-10-28 NOTE — Progress Notes (Signed)
i agree with the above note, plan 

## 2014-11-20 LAB — HEMOGLOBIN A1C: Hgb A1c MFr Bld: 6.4 % — AB (ref 4.0–6.0)

## 2014-11-20 LAB — HM DIABETES FOOT EXAM: HM Diabetic Foot Exam: NORMAL

## 2014-11-20 LAB — BASIC METABOLIC PANEL: GLUCOSE: 122 mg/dL

## 2014-11-21 ENCOUNTER — Encounter (HOSPITAL_BASED_OUTPATIENT_CLINIC_OR_DEPARTMENT_OTHER): Payer: PRIVATE HEALTH INSURANCE

## 2014-11-21 ENCOUNTER — Encounter: Payer: Self-pay | Admitting: Internal Medicine

## 2014-12-12 ENCOUNTER — Other Ambulatory Visit: Payer: Self-pay | Admitting: Gastroenterology

## 2014-12-22 ENCOUNTER — Other Ambulatory Visit: Payer: Self-pay | Admitting: Cardiovascular Disease

## 2014-12-25 ENCOUNTER — Ambulatory Visit (INDEPENDENT_AMBULATORY_CARE_PROVIDER_SITE_OTHER): Payer: PRIVATE HEALTH INSURANCE | Admitting: Gastroenterology

## 2014-12-25 ENCOUNTER — Telehealth: Payer: Self-pay | Admitting: Gastroenterology

## 2014-12-25 ENCOUNTER — Encounter: Payer: Self-pay | Admitting: Gastroenterology

## 2014-12-25 VITALS — BP 110/76 | HR 64 | Ht 67.5 in | Wt 186.2 lb

## 2014-12-25 DIAGNOSIS — K52832 Lymphocytic colitis: Secondary | ICD-10-CM

## 2014-12-25 MED ORDER — MESALAMINE 1.2 G PO TBEC: 2.4000 g | DELAYED_RELEASE_TABLET | Freq: Every day | ORAL | Status: DC

## 2014-12-25 NOTE — Patient Instructions (Addendum)
Trial of lialda 2.4gms once daily. Stay on budesonide 3 pills once daily. Please return to see Dr. Ardis Hughs in 2 months.

## 2014-12-25 NOTE — Progress Notes (Signed)
Review of pertinent gastrointestinal problems: 1. Adenomatous polyps: colonoscopy by Dr. Ardis Hughs August 2012 with findings of a sigmoid pedunculated polyp. Path compatible with tubular adenoma , no high-grade dysplasia 2. Lymphocytic colitis: labs 2016 cbc, cmet, stool culture, c diff celiac panel all negative. Fecal lactoferin positive. Colonoscopy 05/2014 Dr. Ardis Hughs found normal TI, normal colon except diverticulosis, random biopsies + for lymphocytic colitis. He started budesonide 3 pills once daily and within 10-12 days symptoms dramatically improved. He stopped after 30 day course. Was fine for 8 weeks; watery diarrhea returned 09/2014;  Took 2 months of 9mg  daily to really help his symptoms.   HPI: This is a   somewhat dysthymic but otherwise very pleasant 54 year old man whom I last saw several months ago.  Chief complaint is diarrhea  Took longer for budesonide to help his diarrhea symptoms.  Eventually did, then he started to wean off.  Currently on 9mg  daily and bowel are normal.  Past Medical History  Diagnosis Date  . Diabetes mellitus     dx at age 80, not overweight was told he was type 1  . Hyperlipemia   . Sleep apnea     has CPAP does not use  . Depression     has taken meds for depression before  . Ischemic cardiomyopathy     a. EF 45% by V-gram 04/2011;  b. Echo 04/19/2011 EF 55%, Gr 1 DD.  . Tobacco abuse     reports he quit after STEMI  . Chronic headaches     cluster  . CAD (coronary artery disease)     a. ant stemi 04/2011 - totalled LAD treated w/ thrombectomy & 3.5 x 16 Promus DES.  OTW nonobs dzs.  EF 45%    Past Surgical History  Procedure Laterality Date  . Tonsillectomy    . Vasectomy    . Cardiac cath with pci  04/17/2011    DES to LAD  . Left heart catheterization with coronary angiogram N/A 04/17/2011    Procedure: LEFT HEART CATHETERIZATION WITH CORONARY ANGIOGRAM;  Surgeon: Burnell Blanks, MD;  Location: [facility] CATH LAB;  Service:  Cardiovascular;  Laterality: N/A;  . Percutaneous coronary stent intervention (pci-s)  04/17/2011    Procedure: PERCUTANEOUS CORONARY STENT INTERVENTION (PCI-S);  Surgeon: Burnell Blanks, MD;  Location: [facility] CATH LAB;  Service: Cardiovascular;;  . Colonoscopy with propofol N/A 05/16/2014    Procedure: COLONOSCOPY WITH PROPOFOL;  Surgeon: [name], MD;  Location: [facility] ENDOSCOPY;  Service: Endoscopy;  Laterality: N/A;    Current Outpatient Prescriptions  Medication Sig Dispense Refill  . aspirin EC 81 MG tablet Take 81 mg by mouth every morning.     Marland Kitchen atorvastatin (LIPITOR) 20 MG tablet TAKE 1 TABLET DAILY 90 tablet 1  . budesonide (ENTOCORT EC) 3 MG 24 hr capsule TAKE 3 CAPSULES (9 MG TOTAL) BY MOUTH DAILY. 90 capsule 3  . carvedilol (COREG) 3.125 MG tablet TAKE 1 TABLET TWICE A DAY WITH MEALS 180 tablet 3  . clopidogrel (PLAVIX) 75 MG tablet TAKE 1 TABLET BY MOUTH EVERY DAY 90 tablet 1  . GLUCAGON EMERGENCY 1 MG injection     . lisinopril (PRINIVIL,ZESTRIL) 2.5 MG tablet Take 0.5 tablets (1.25 mg total) by mouth every morning. 90 tablet 0  . nitroGLYCERIN (NITROSTAT) 0.4 MG SL tablet Place 0.4 mg under the tongue every 5 (five) minutes as needed for chest pain.    Marland Kitchen NOVOLOG 100 UNIT/ML injection Use as directed per sliding scale (insulin pump)    .  ONE TOUCH ULTRA TEST test strip USE AS NEEDED (Patient taking differently: Use to test blood sugar four to six times daily.) 100 each 1   No current facility-administered medications for this visit.    Allergies as of 12/25/2014  . (No Known Allergies)    Family History  Problem Relation Age of Onset  . Multiple sclerosis Mother   . Parkinsonism Father   . Stroke Father   . Coronary artery disease Neg Hx   . Hypertension Neg Hx   . Diabetes Neg Hx   . Colon cancer Neg Hx   . Prostate cancer Neg Hx     Social History   Social History  . Marital Status: Married    Spouse Name: N/A  . Number of Children: 1  . Years of  Education: N/A   Occupational History  .    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 Smoking status: Former Smoker -- 0.50 packs/day for 15 years    Types: Cigarettes    Quit date: 04/17/2011  . Smokeless tobacco: Never Used  . Alcohol Use: 3.0 - 3.6 oz/week    5-6 Shots of liquor per week     Comment: weekends only  . Drug Use: No  . Sexual Activity: Not Currently   Other Topics Concern  . Not on file   Social History Narrative   Moved from [place] on July 2010---   Regular exercise: started going to the gym ~ 09-2011-----   Lives by himself----      Physical Exam: BP 110/76 mmHg  Pulse 64  Ht 5' 7.5" (1.715 m)  Wt 186 lb 3.2 oz (84.46 kg)  BMI 28.72 kg/m2 Constitutional: generally well-appearing Psychiatric: alert and oriented x3 Abdomen: soft, nontender, nondistended, no obvious ascites, no peritoneal signs, normal bowel sounds   Assessment and plan: 54 y.o. male with biopsy-proven lymphocytic colitis  He is having difficulty weaning off of budesonide. I'm going to start him on mesalamine 2.4 g once daily and he'll return to see me in about 2 months time. He understands not to taper any medicines before that 2 month visit. I'm hoping he can start coming back off the budesonide a bit after that. I see no reason for further blood tests or imaging studies prior to then.   Owens Loffler, MD [facility] 12/25/2014, 10:01 AM

## 2014-12-26 NOTE — Telephone Encounter (Signed)
The pt was called and a savings card has been left at the front desk for the pt.  He will pick it up later today.

## 2015-01-02 ENCOUNTER — Other Ambulatory Visit: Payer: Self-pay | Admitting: Cardiovascular Disease

## 2015-04-05 ENCOUNTER — Other Ambulatory Visit: Payer: Self-pay | Admitting: Cardiovascular Disease

## 2015-04-07 NOTE — Telephone Encounter (Signed)
Nathaniel [name]  01/02/2015  Refill  MRN:  [id number]   Description: 55 year old male  Provider: Burnell Blanks, MD  Department: [facility] Office       Call Documentation     No notes of this type exist for this encounter.     Encounter MyChart Messages     No messages in this encounter     Approved      Disp Refills Start End    lisinopril (PRINIVIL,ZESTRIL) 2.5 MG tablet 45 tablet 3 01/06/2015     Sig:  TAKE 1/2 TABLET (1.25MG ) INTHE MORNING    Class:  Normal    DAW:  No    Authorizing Provider:  Burnell Blanks, MD    Ordering User:  Bernita Raisin, RN    atorvastatin (LIPITOR) 20 MG tablet 90 tablet 3 01/06/2015     Sig:  TAKE 1 TABLET DAILY    Class:  Normal    DAW:  No    Authorizing Provider:  Burnell Blanks, MD    Ordering User:  Bernita Raisin, RN      Visit Pharmacy     [facility], [place]

## 2015-05-27 LAB — HEPATIC FUNCTION PANEL
ALT: 21 U/L (ref 10–40)
AST: 17 U/L (ref 14–40)
Alkaline Phosphatase: 70 U/L (ref 25–125)
BILIRUBIN, TOTAL: 0.7 mg/dL

## 2015-05-27 LAB — LIPID PANEL
Cholesterol: 146 mg/dL (ref 0–200)
HDL: 57 mg/dL (ref 35–70)
LDL Cholesterol: 76 mg/dL
LDl/HDL Ratio: 2.5
Triglycerides: 61 mg/dL (ref 40–160)

## 2015-05-27 LAB — BASIC METABOLIC PANEL
BUN: 16 mg/dL (ref 4–21)
Creatinine: 1.1 mg/dL (ref 0.6–1.3)
Glucose: 64 mg/dL
POTASSIUM: 5.2 mmol/L (ref 3.4–5.3)
SODIUM: 139 mmol/L (ref 137–147)

## 2015-05-27 LAB — MICROALBUMIN, URINE: Microalb, Ur: <0.7

## 2015-05-27 LAB — TSH: TSH: 0.99 u[IU]/mL (ref 0.41–5.90)

## 2015-05-27 LAB — HM DIABETES FOOT EXAM: HM Diabetic Foot Exam: NORMAL

## 2015-05-27 LAB — HEMOGLOBIN A1C: Hemoglobin A1C: 6.1

## 2015-05-29 ENCOUNTER — Encounter: Payer: Self-pay | Admitting: Internal Medicine

## 2015-06-11 ENCOUNTER — Other Ambulatory Visit: Payer: Self-pay | Admitting: Gastroenterology

## 2015-06-26 ENCOUNTER — Other Ambulatory Visit: Payer: Self-pay | Admitting: Cardiovascular Disease

## 2015-07-18 ENCOUNTER — Other Ambulatory Visit: Payer: Self-pay | Admitting: Cardiovascular Disease

## 2015-08-18 ENCOUNTER — Other Ambulatory Visit: Payer: Self-pay | Admitting: *Deleted

## 2015-08-18 ENCOUNTER — Telehealth: Payer: Self-pay | Admitting: Cardiovascular Disease

## 2015-08-18 MED ORDER — CARVEDILOL 3.125 MG PO TABS: 3.1250 mg | ORAL_TABLET | Freq: Two times a day (BID) | ORAL | Status: DC

## 2015-08-18 NOTE — Telephone Encounter (Signed)
New message        *STAT* If patient is at the pharmacy, call can be transferred to refill team.   1. Which medications need to be refilled? (please list name of each medication and dose if known) carvedilol 3.125 2. Which pharmacy/location (including street and city if local pharmacy) is medication to be sent to? [facility] [place]  3. Do they need a 30 day or 90 day supply? [place]

## 2015-10-09 ENCOUNTER — Encounter: Payer: Self-pay | Admitting: Cardiovascular Disease

## 2015-10-16 ENCOUNTER — Other Ambulatory Visit: Payer: Self-pay | Admitting: Cardiovascular Disease

## 2015-10-23 ENCOUNTER — Encounter: Payer: Self-pay | Admitting: Gastroenterology

## 2015-10-27 ENCOUNTER — Other Ambulatory Visit: Payer: Self-pay | Admitting: Gastroenterology

## 2015-10-27 ENCOUNTER — Encounter: Payer: Self-pay | Admitting: Cardiovascular Disease

## 2015-10-27 ENCOUNTER — Encounter (INDEPENDENT_AMBULATORY_CARE_PROVIDER_SITE_OTHER): Payer: Self-pay

## 2015-10-27 ENCOUNTER — Ambulatory Visit (INDEPENDENT_AMBULATORY_CARE_PROVIDER_SITE_OTHER): Payer: No Typology Code available for payment source | Admitting: Cardiovascular Disease

## 2015-10-27 VITALS — BP 110/70 | HR 60 | Ht 67.5 in | Wt 182.0 lb

## 2015-10-27 DIAGNOSIS — I2510 Atherosclerotic heart disease of native coronary artery without angina pectoris: Secondary | ICD-10-CM

## 2015-10-27 DIAGNOSIS — I10 Essential (primary) hypertension: Secondary | ICD-10-CM | POA: Diagnosis not present

## 2015-10-27 DIAGNOSIS — F17201 Nicotine dependence, unspecified, in remission: Secondary | ICD-10-CM

## 2015-10-27 DIAGNOSIS — E785 Hyperlipidemia, unspecified: Secondary | ICD-10-CM | POA: Diagnosis not present

## 2015-10-27 NOTE — Patient Instructions (Signed)

## 2015-10-27 NOTE — Progress Notes (Signed)
Chief Complaint  Patient presents with  . Follow-up    History of Present Illness: 55 y.o. male with history of DM, HTN, OSA, tobacco abuse, OSA and CAD with anterior STEMI March 2013 who is here today for cardiac follow up. He was admitted March 2013 with an anterior STEMI. Emergent LHC 04/17/11: Ostial LAD 100% occluded, mid 50%, mild plaque in the circumflex and RCA, anterior wall and apical hypokinesis with an EF of 45%. PCI: Promus DES to the ostial LAD. He was admitted to [facility] 10/26/11 with chest pain and ruled out for MI. Chest pain was atypical. Outpatient myoview was arranged and showed anterior and apical defect d/w scar and peri-infarct ischemia. Cardiac cath on 11/10/11 with patent LAD stent, mild disease in mid LAD.   He is here today for follow up. Feeling well.  No chest pain or SOB, syncope, orthopnea, PND or significant pedal edema.  No daily exercise. He is riding his bike some.   Primary Care Physician: Kathlene November, MD  Past Medical History:  Diagnosis Date  . CAD (coronary artery disease)    a. ant stemi 04/2011 - totalled LAD treated w/ thrombectomy & 3.5 x 16 Promus DES.  OTW nonobs dzs.  EF 45%  . Chronic headaches    cluster  . Depression    has taken meds for depression before  . Diabetes mellitus    dx at age 15, not overweight was told he was type 1  . Hyperlipemia   . Ischemic cardiomyopathy    a. EF 45% by V-gram 04/2011;  b. Echo 04/19/2011 EF 55%, Gr 1 DD.  Marland Kitchen Sleep apnea    has CPAP does not use  . Tobacco abuse    reports he quit after STEMI    Past Surgical History:  Procedure Laterality Date  . Cardiac cath with PCI  04/17/2011   DES to LAD  . COLONOSCOPY WITH PROPOFOL N/A 05/16/2014   Procedure: COLONOSCOPY WITH PROPOFOL;  Surgeon: [name], MD;  Location: [facility]OS[facility];  Service: Endoscopy;  Laterality: N/A;  . LEFT HEART CATHETERIZATION WITH CORONARY ANGIOGRAM N/A 04/17/2011   Procedure: LEFT HEART CATHETERIZATION WITH CORONARY ANGIOGRAM;   Surgeon: Burnell Blanks, MD;  Location: [facility] CATH LAB;  Service: Cardiovascular;  Laterality: N/A;  . PERCUTANEOUS CORONARY STENT INTERVENTION (PCI-S)  04/17/2011   Procedure: PERCUTANEOUS CORONARY STENT INTERVENTION (PCI-S);  Surgeon: Burnell Blanks, MD;  Location: [facility] CATH LAB;  Service: Cardiovascular;;  . TONSILLECTOMY    . VASECTOMY      Current Outpatient Prescriptions  Medication Sig Dispense Refill  . aspirin EC 81 MG tablet Take 81 mg by mouth every morning.     Marland Kitchen atorvastatin (LIPITOR) 20 MG tablet TAKE 1 TABLET DAILY 90 tablet 3  . budesonide (ENTOCORT EC) 3 MG 24 hr capsule TAKE 3 CAPSULES (9 MG TOTAL) BY MOUTH DAILY. 90 capsule 3  . carvedilol (COREG) 3.125 MG tablet Take 1 tablet by mouth 2 (two) times daily.     . clopidogrel (PLAVIX) 75 MG tablet TAKE 1 TABLET BY MOUTH EVERY DAY 30 tablet 0  . GLUCAGON EMERGENCY 1 MG injection     . Insulin Glargine (BASAGLAR KWIKPEN) 100 UNIT/ML SOPN As directed.    Marland Kitchen lisinopril (PRINIVIL,ZESTRIL) 2.5 MG tablet TAKE 1/2 TABLET (1.25MG ) INTHE MORNING 45 tablet 3  . nitroGLYCERIN (NITROSTAT) 0.4 MG SL tablet Place 0.4 mg under the tongue every 5 (five) minutes as needed for chest pain.    Marland Kitchen NOVOLOG  100 UNIT/ML injection Use as directed per sliding scale (insulin pump)    . ONE TOUCH ULTRA TEST test strip USE AS NEEDED (Patient taking differently: Use to test blood sugar four to six times daily.) 100 each 1   No current facility-administered medications for this visit.     No Known Allergies  Social History   Social History  . Marital status: Married    Spouse name: N/A  . Number of children: 1  . Years of education: N/A   Occupational History  . PRICE ANALYST Psychologist, counselling   Social History Main Topics  . Smoking status: Former Smoker    Packs/day: 0.50    Years: 15.00    Types: Cigarettes    Quit date: 04/17/2011  . Smokeless tobacco: Never Used  . Alcohol use 3.0 - 3.6 oz/week    5 - 6  Shots of liquor per week     Comment: weekends only  . Drug use: No  . Sexual activity: Not Currently   Other Topics Concern  . Not on file   Social History Narrative   Moved from [place] on July 2010---   Regular exercise: started going to the gym ~ 09-2011-----   Lives by himself----     Family History  Problem Relation Age of Onset  . Multiple sclerosis Mother   . Parkinsonism Father   . Stroke Father   . Coronary artery disease Neg Hx   . Hypertension Neg Hx   . Diabetes Neg Hx   . Colon cancer Neg Hx   . Prostate cancer Neg Hx     Review of Systems:  As stated in the HPI and otherwise negative.   BP 110/70   Pulse 60   Ht 5' 7.5" (1.715 m)   Wt 182 lb (82.6 kg)   BMI 28.08 kg/m   Physical Examination: General: Well developed, well nourished, NAD  HEENT: OP clear, mucus membranes moist  SKIN: warm, dry. No rashes. Neuro: No focal deficits  Musculoskeletal: Muscle strength 5/5 all ext  Psychiatric: Mood and affect normal  Neck: No JVD, no carotid bruits, no thyromegaly, no lymphadenopathy.  Lungs:Clear bilaterally, no wheezes, rhonci, crackles Cardiovascular: Regular rate and rhythm. No murmurs, gallops or rubs. Abdomen:Soft. Bowel sounds present. Non-tender.  Extremities: No lower extremity edema. Pulses are 2 + in the bilateral DP/PT.  EKG:  EKG  ordered today. The ekg ordered today demonstrates sinus brady, rate 55 bpm.   Recent Labs: 05/27/2015: ALT 21; BUN 16; Creatinine 1.1; Potassium 5.2; Sodium 139; TSH 0.99   Lipid Panel    Component Value Date/Time   CHOL 146 05/27/2015   TRIG 61 05/27/2015   HDL 57 05/27/2015   CHOLHDL 1.8 10/27/2011 0544   VLDL 10 10/27/2011 0544   LDLCALC 76 05/27/2015   LDLDIRECT 140.0 08/25/2010 1108     Wt Readings from Last 3 Encounters:  10/27/15 182 lb (82.6 kg)  12/25/14 186 lb 3.2 oz (84.5 kg)  10/25/14 184 lb 2 oz (83.5 kg)     Other studies Reviewed: Additional studies/ records that were reviewed today  include: . Review of the above records demonstrates:    Assessment and Plan:   1. CAD:  He has had no recent chest pain to suggest unstable angina. Last cardiac cath 11/10/11 with patent LAD stent. He is feeling well. He is on ASA/Plavix/statin/beta blocker/Ace-inh. No medication changes today.  2. HTN: BP well controlled. No changes.   3.  Tobacco abuse, in remission: He has stopped smoking.   4. Hyperlipidemia: Continue statin. Lipids controlled.   Current medicines are reviewed at length with the patient today.  The patient does not have concerns regarding medicines.  The following changes have been made:  no change  Labs/ tests ordered today include:   Orders Placed This Encounter  Procedures  . EKG 12-Lead    Disposition:   FU with me in 12  months  Signed, Lauree Chandler, MD 10/27/2015 9:19 AM    [facility] [address], [place], [place]  [address] Phone: [phone]; Fax: [phone]

## 2015-11-12 ENCOUNTER — Other Ambulatory Visit: Payer: Self-pay | Admitting: Cardiovascular Disease

## 2015-12-03 LAB — HM DIABETES FOOT EXAM

## 2015-12-03 LAB — BASIC METABOLIC PANEL: GLUCOSE: 107 mg/dL

## 2015-12-03 LAB — HEMOGLOBIN A1C: HEMOGLOBIN A1C: 6.5

## 2015-12-08 ENCOUNTER — Other Ambulatory Visit: Payer: Self-pay | Admitting: Cardiovascular Disease

## 2015-12-08 MED ORDER — CLOPIDOGREL BISULFATE 75 MG PO TABS: 75.0000 mg | ORAL_TABLET | Freq: Every day | ORAL | 3 refills | Status: DC

## 2015-12-08 NOTE — Telephone Encounter (Signed)
Clopidgrel Needs a 90 day supply not 30day supply

## 2015-12-08 NOTE — Telephone Encounter (Signed)
Pt's Rx was sent to pt's pharmacy as requested, with 90 day supply. Confirmation received.

## 2015-12-11 ENCOUNTER — Encounter: Payer: Self-pay | Admitting: Internal Medicine

## 2015-12-16 ENCOUNTER — Other Ambulatory Visit: Payer: Self-pay | Admitting: Gastroenterology

## 2015-12-30 ENCOUNTER — Other Ambulatory Visit: Payer: Self-pay

## 2015-12-30 MED ORDER — BUDESONIDE 3 MG PO CPEP: 9.0000 mg | ORAL_CAPSULE | Freq: Every day | ORAL | 0 refills | Status: DC

## 2016-01-11 ENCOUNTER — Other Ambulatory Visit: Payer: Self-pay | Admitting: Cardiovascular Disease

## 2016-02-11 ENCOUNTER — Encounter: Payer: Self-pay | Admitting: Internal Medicine

## 2016-02-11 ENCOUNTER — Ambulatory Visit (HOSPITAL_BASED_OUTPATIENT_CLINIC_OR_DEPARTMENT_OTHER)
Admission: RE | Admit: 2016-02-11 | Discharge: 2016-02-11 | Disposition: A | Discharge status: Home / Self Care | Payer: No Typology Code available for payment source | Source: Ambulatory Visit | Attending: Internal Medicine | Admitting: Internal Medicine

## 2016-02-11 ENCOUNTER — Ambulatory Visit (INDEPENDENT_AMBULATORY_CARE_PROVIDER_SITE_OTHER): Payer: No Typology Code available for payment source | Admitting: Internal Medicine

## 2016-02-11 VITALS — BP 108/78 | HR 76 | Temp 97.6°F | Resp 14 | Ht 67.5 in | Wt 192.0 lb

## 2016-02-11 DIAGNOSIS — R1310 Dysphagia, unspecified: Secondary | ICD-10-CM

## 2016-02-11 DIAGNOSIS — Z09 Encounter for follow-up examination after completed treatment for conditions other than malignant neoplasm: Secondary | ICD-10-CM | POA: Insufficient documentation

## 2016-02-11 DIAGNOSIS — Z87891 Personal history of nicotine dependence: Secondary | ICD-10-CM | POA: Insufficient documentation

## 2016-02-11 DIAGNOSIS — R1319 Other dysphagia: Secondary | ICD-10-CM

## 2016-02-11 LAB — CBC WITH DIFFERENTIAL/PLATELET
Basophils Absolute: 0 10*3/uL (ref 0.0–0.1)
Basophils Relative: 0.7 % (ref 0.0–3.0)
Eosinophils Absolute: 0.2 10*3/uL (ref 0.0–0.7)
Eosinophils Relative: 3.4 % (ref 0.0–5.0)
HEMATOCRIT: 42 % (ref 39.0–52.0)
Hemoglobin: 14.3 g/dL (ref 13.0–17.0)
LYMPHS ABS: 0.9 10*3/uL (ref 0.7–4.0)
LYMPHS PCT: 19.3 % (ref 12.0–46.0)
MCHC: 34 g/dL (ref 30.0–36.0)
MCV: 96.4 fl (ref 78.0–100.0)
MONOS PCT: 11.5 % (ref 3.0–12.0)
Monocytes Absolute: 0.5 10*3/uL (ref 0.1–1.0)
NEUTROS ABS: 3.1 10*3/uL (ref 1.4–7.7)
NEUTROS PCT: 65.1 % (ref 43.0–77.0)
PLATELETS: 225 10*3/uL (ref 150.0–400.0)
RBC: 4.36 Mil/uL (ref 4.22–5.81)
RDW: 13.7 % (ref 11.5–15.5)
WBC: 4.7 10*3/uL (ref 4.0–10.5)

## 2016-02-11 LAB — BASIC METABOLIC PANEL
BUN: 21 mg/dL (ref 6–23)
CO2: 30 meq/L (ref 19–32)
Calcium: 9.1 mg/dL (ref 8.4–10.5)
Chloride: 106 mEq/L (ref 96–112)
Creatinine, Ser: 1.14 mg/dL (ref 0.40–1.50)
GFR: 70.73 mL/min (ref >60.00)
GLUCOSE: 90 mg/dL (ref 70–99)
POTASSIUM: 4.5 meq/L (ref 3.5–5.1)
SODIUM: 141 meq/L (ref 135–145)

## 2016-02-11 NOTE — Progress Notes (Signed)
Pre visit review using our clinic review tool, if applicable. No additional management support is needed unless otherwise documented below in the visit note. 

## 2016-02-11 NOTE — Progress Notes (Signed)
Subjective:    Patient ID: Nathaniel [name], male    DOB: 19-Dec-1960, 56 y.o.   MRN: [id number]  DOS:  02/11/2016 Type of visit - description : last OV 03-2014, acute visit  Interval history: Patient reports difficulty swallowing for the last 2 months: Usually with solids, essentially no sx with fluids.  Typically, food seems to get stuck in the middle of his chest and sometimes is painful. He is concerned about esophageal cancer. Also for the last couple of days he has noticed some postnasal dripping & hoarseness but those symptoms are not chronic    Review of Systems  denies fever chills No weight loss No recent or remote heartburn, never DX with GERD No nausea, vomiting. No diarrhea or change in the color of the stools.   Past Medical History:  Diagnosis Date  . CAD (coronary artery disease)    a. ant stemi 04/2011 - totalled LAD treated w/ thrombectomy & 3.5 x 16 Promus DES.  OTW nonobs dzs.  EF 45%  . Chronic headaches    cluster  . Depression    has taken meds for depression before  . Diabetes mellitus    dx at age 70, not overweight was told he was type 1  . Hyperlipemia   . Ischemic cardiomyopathy    a. EF 45% by V-gram 04/2011;  b. Echo 04/19/2011 EF 55%, Gr 1 DD.  Marland Kitchen Sleep apnea    has CPAP does not use  . Tobacco abuse    reports he quit after STEMI    Past Surgical History:  Procedure Laterality Date  . Cardiac cath with PCI  04/17/2011   DES to LAD  . COLONOSCOPY WITH PROPOFOL N/A 05/16/2014   Procedure: COLONOSCOPY WITH PROPOFOL;  Surgeon: [name], MD;  Location: [facility];  Service: Endoscopy;  Laterality: N/A;  . LEFT HEART CATHETERIZATION WITH CORONARY ANGIOGRAM N/A 04/17/2011   Procedure: LEFT HEART CATHETERIZATION WITH CORONARY ANGIOGRAM;  Surgeon: Burnell Blanks, MD;  Location: [facility] CATH LAB;  Service: Cardiovascular;  Laterality: N/A;  . PERCUTANEOUS CORONARY STENT INTERVENTION (PCI-S)  04/17/2011   Procedure: PERCUTANEOUS CORONARY STENT  INTERVENTION (PCI-S);  Surgeon: Burnell Blanks, MD;  Location: [facility] CATH LAB;  Service: Cardiovascular;;  . TONSILLECTOMY    . VASECTOMY      Social History   Social History  . Marital status: Married    Spouse name: N/A  . Number of children: 1  . Years of education: N/A   Occupational History  . PRICE ANALYST Psychologist, counselling   Social History Main Topics  . Smoking status: Former Smoker    Packs/day: 0.50    Years: 15.00    Types: Cigarettes    Quit date: 04/17/2011  . Smokeless tobacco: Never Used  . Alcohol use 3.0 - 3.6 oz/week    5 - 6 Shots of liquor per week     Comment: some days   . Drug use: No  . Sexual activity: Not Currently   Other Topics Concern  . Not on file   Social History Narrative   Moved from [place] on July 2010    Household- pt and wife      Allergies as of 02/11/2016   No Known Allergies     Medication List       Accurate as of 02/11/16  9:11 PM. Always use your most recent med list.          aspirin  EC 81 MG tablet Take 81 mg by mouth every morning.   atorvastatin 20 MG tablet Commonly known as:  LIPITOR TAKE 1 TABLET DAILY   BASAGLAR KWIKPEN 100 UNIT/ML Sopn As directed.   budesonide 3 MG 24 hr capsule Commonly known as:  ENTOCORT EC Take 3 capsules (9 mg total) by mouth daily.   carvedilol 3.125 MG tablet Commonly known as:  COREG TAKE 1 TABLET BY MOUTH 2 TIMES DAILY WITH A MEAL.   clopidogrel 75 MG tablet Commonly known as:  PLAVIX Take 1 tablet (75 mg total) by mouth daily.   GLUCAGON EMERGENCY 1 MG injection Generic drug:  glucagon   lisinopril 2.5 MG tablet Commonly known as:  PRINIVIL,ZESTRIL TAKE 1/2 TABLET (1.25MG ) INTHE MORNING   nitroGLYCERIN 0.4 MG SL tablet Commonly known as:  NITROSTAT Place 0.4 mg under the tongue every 5 (five) minutes as needed for chest pain.   NOVOLOG 100 UNIT/ML injection Generic drug:  insulin aspart Use as directed per sliding scale (insulin  pump)   ONE TOUCH ULTRA TEST test strip Generic drug:  glucose blood USE AS NEEDED          Objective:   Physical Exam BP 108/78 (BP Location: Right Arm, Patient Position: Sitting, Cuff Size: Normal)   Pulse 76   Temp 97.6 F (36.4 C) (Oral)   Resp 14   Ht 5' 7.5" (1.715 m)   Wt 192 lb (87.1 kg)   SpO2 97%   BMI 29.63 kg/m  General:   Well developed, well nourished . NAD.  HEENT:  Normocephalic . Face symmetric, atraumatic Neck: No thyromegaly, no LADs Lungs:  CTA B Normal respiratory effort, no intercostal retractions, no accessory muscle use. Heart: RRR,  no murmur.  no pretibial edema bilaterally  Abdomen:  Not distended, soft, non-tender. No rebound or rigidity.  Skin: Not pale. Not jaundice Neurologic:  alert & oriented X3.  Speech normal, gait appropriate for age and unassisted Psych--  Cognition and judgment appear intact.  Cooperative with normal attention span and concentration.  Behavior appropriate. No anxious or depressed appearing.    Assessment & Plan:   Assessment DM--endocrinology, Dr Buddy Duty Hyperlipidemia CAD Depression Colitis, microscopic, Dr Ardis Hughs  Sleep apnea  PLAN: Dysphagia: New onset of dysphagia on a 56 year old gentleman, former smoker, no previous history of GERD, not on PPIs. Recent TSH normal, physical exam is benign. Recommend CBC, BMP, chest x-ray.  He will see GI for an unrelated issue in few days, will send a message to Dr. Ardis Hughs letting him know about the upper GI symptoms. Pt declined a trial with PPIs.

## 2016-02-11 NOTE — Assessment & Plan Note (Signed)
Dysphagia: New onset of dysphagia on a 56 year old gentleman, former smoker, no previous history of GERD, not on PPIs. Recent TSH normal, physical exam is benign. Recommend CBC, BMP, chest x-ray.  He will see GI for an unrelated issue in few days, will send a message to Dr. Ardis Hughs letting him know about the upper GI symptoms. Pt declined a trial with PPIs.

## 2016-02-11 NOTE — Patient Instructions (Signed)
  GO TO THE LAB : Get the blood work     

## 2016-02-20 ENCOUNTER — Ambulatory Visit (INDEPENDENT_AMBULATORY_CARE_PROVIDER_SITE_OTHER): Payer: No Typology Code available for payment source | Admitting: Gastroenterology

## 2016-02-20 ENCOUNTER — Encounter: Payer: Self-pay | Admitting: Gastroenterology

## 2016-02-20 VITALS — BP 90/64 | HR 60 | Ht 67.5 in | Wt 188.4 lb

## 2016-02-20 DIAGNOSIS — K52832 Lymphocytic colitis: Secondary | ICD-10-CM

## 2016-02-20 DIAGNOSIS — R1310 Dysphagia, unspecified: Secondary | ICD-10-CM | POA: Diagnosis not present

## 2016-02-20 NOTE — Progress Notes (Signed)
Review of pertinent gastrointestinal problems: 1. Adenomatous polyps: colonoscopy by Dr. Ardis Hughs August 2012 with findings of a sigmoid pedunculated polyp. Path compatible with tubular adenoma , no high-grade dysplasia 2. Lymphocytic colitis: labs 2016 cbc, cmet, stool culture, c diff celiac panel all negative. Fecal lactoferin positive. Colonoscopy 05/2014 Dr. Ardis Hughs found normal TI, normal colon except diverticulosis, random biopsies + for lymphocytic colitis. He started budesonide 3 pills once daily and within 10-12 days symptoms dramatically improved. He stopped after 30 day course. Was fine for 8 weeks; watery diarrhea returned 09/2014;  Took 2 months of 9mg  daily to really help his symptoms. 01/2015 added mesalamine to wean off the budesonide, he never called back to report on his response.    HPI: This is a  pleasant 56 year old man whom I last saw a little over a year ago.  Chief complaint is lymphocytic colitis, new odynophagia  He is on plavix for DES in coronary artery 2013  He started lialda a year ago and this made his loose stools worse.  Worse diarrhea and so he stopped the mesalamine.  For many months he's been on entocort once daily; taking it at this dose for at least 3 months.  Currently having solid brown QD stools. No abd pains or urgency.  Also noted pain after swallowing, painful with solid food only.  No dysphagia.  Started 2-3 months ago.  Not with every swallow. Really in the past week he hasn't felt it all.  No abx recently.  He never has pyrosis.  Not on PPI or other acid meds.  Gained weight over holiday's  ROS: complete GI ROS as described in HPI.  Constitutional:  No unintentional weight loss   Past Medical History:  Diagnosis Date  . CAD (coronary artery disease)    a. ant stemi 04/2011 - totalled LAD treated w/ thrombectomy & 3.5 x 16 Promus DES.  OTW nonobs dzs.  EF 45%  . Chronic headaches    cluster  . Depression    has taken meds for depression  before  . Diabetes mellitus    dx at age 66, not overweight was told he was type 1  . Hyperlipemia   . Ischemic cardiomyopathy    a. EF 45% by V-gram 04/2011;  b. Echo 04/19/2011 EF 55%, Gr 1 DD.  Marland Kitchen Sleep apnea    has CPAP does not use  . Tobacco abuse    reports he quit after STEMI    Past Surgical History:  Procedure Laterality Date  . Cardiac cath with PCI  04/17/2011   DES to LAD  . COLONOSCOPY WITH PROPOFOL N/A 05/16/2014   Procedure: COLONOSCOPY WITH PROPOFOL;  Surgeon: [name], MD;  Location: [facility];  Service: Endoscopy;  Laterality: N/A;  . LEFT HEART CATHETERIZATION WITH CORONARY ANGIOGRAM N/A 04/17/2011   Procedure: LEFT HEART CATHETERIZATION WITH CORONARY ANGIOGRAM;  Surgeon: Burnell Blanks, MD;  Location: [facility] CATH LAB;  Service: Cardiovascular;  Laterality: N/A;  . PERCUTANEOUS CORONARY STENT INTERVENTION (PCI-S)  04/17/2011   Procedure: PERCUTANEOUS CORONARY STENT INTERVENTION (PCI-S);  Surgeon: Burnell Blanks, MD;  Location: [facility] CATH LAB;  Service: Cardiovascular;;  . TONSILLECTOMY    . VASECTOMY      Current Outpatient Prescriptions  Medication Sig Dispense Refill  . aspirin EC 81 MG tablet Take 81 mg by mouth every morning.     Marland Kitchen atorvastatin (LIPITOR) 20 MG tablet TAKE 1 TABLET DAILY 90 tablet 3  . budesonide (ENTOCORT EC) 3 MG 24 hr  capsule Take 3 capsules (9 mg total) by mouth daily. (Patient taking differently: Take 3 mg by mouth daily. ) 270 capsule 0  . carvedilol (COREG) 3.125 MG tablet TAKE 1 TABLET BY MOUTH 2 TIMES DAILY WITH A MEAL. 180 tablet 3  . clopidogrel (PLAVIX) 75 MG tablet Take 1 tablet (75 mg total) by mouth daily. 90 tablet 3  . GLUCAGON EMERGENCY 1 MG injection     . Insulin Glargine (BASAGLAR KWIKPEN) 100 UNIT/ML SOPN As directed.    Marland Kitchen lisinopril (PRINIVIL,ZESTRIL) 2.5 MG tablet TAKE 1/2 TABLET (1.25MG ) INTHE MORNING 45 tablet 3  . nitroGLYCERIN (NITROSTAT) 0.4 MG SL tablet Place 0.4 mg under the tongue every 5 (five)  minutes as needed for chest pain.    Marland Kitchen NOVOLOG 100 UNIT/ML injection Use as directed per sliding scale (insulin pump)    . ONE TOUCH ULTRA TEST test strip USE AS NEEDED 100 each 1  . verapamil (CALAN) 80 MG tablet Take 80 mg by mouth as needed.     No current facility-administered medications for this visit.     Allergies as of 02/20/2016  . (No Known Allergies)    Family History  Problem Relation Age of Onset  . Multiple sclerosis Mother   . Parkinsonism Father   . Stroke Father   . Coronary artery disease Neg Hx   . Hypertension Neg Hx   . Diabetes Neg Hx   . Colon cancer Neg Hx   . Prostate cancer Neg Hx     Social History   Social History  . Marital status: Married    Spouse name: N/A  . Number of children: 1  . Years of education: N/A   Occupational History  . PRICE ANALYST Psychologist, counselling   Social History Main Topics  . Smoking status: Former Smoker    Packs/day: 0.50    Years: 15.00    Types: Cigarettes    Quit date: 04/17/2011  . Smokeless tobacco: Never Used  . Alcohol use 3.0 - 3.6 oz/week    5 - 6 Shots of liquor per week     Comment: some days   . Drug use: No  . Sexual activity: Not Currently   Other Topics Concern  . Not on file   Social History Narrative   Moved from [place] on July 2010    Household- pt and wife     Physical Exam: BP 90/64   Pulse 60   Ht 5' 7.5" (1.715 m)   Wt 188 lb 6 oz (85.4 kg)   BMI 29.07 kg/m  Constitutional: generally well-appearing Psychiatric: alert and oriented x3 Abdomen: soft, nontender, nondistended, no obvious ascites, no peritoneal signs, normal bowel sounds No peripheral edema noted in lower extremities  Assessment and plan: 56 y.o. male with Collagenous colitis, new odynophagia  We discussed his collagenous colitis treatment. Unfortunately he was not able to tolerate the mesalamine. It seemed to make his diarrhea worse which is a known potential side effect. I recommended  he try coming further off of the Entocort since it is a steroid even though it is minimally absorbed I would like him to be on the lowest tolerated dose. He will therefore changed to every other day Entocort one pill. He has had pain with swallowing but no dysphagia. This is actually improved recently. He is very concerned about the possibility of underlying neoplasm. I think that is unlikely I recommended we proceed with upper endoscopy at his soonest convenience  to rule out possibility and also check for other potential causes. He has had no dysphagia, I therefore think it is very unlikely I'll be proceeding with dilation and so he can stay on his Plavix without interruption.     Owens Loffler, MD [facility] 02/20/2016, 10:58 AM

## 2016-02-20 NOTE — Patient Instructions (Addendum)
Change to every other day entocort for a month, then come off completely.  Call in 4 weeks to report on your progress. You will be set up for an upper endoscopy for odynophagia while ON plavix.  It has been recommended to you by your physician that you have a(n) Endoscopy completed. Per your request, we did not schedule the procedure(s) today. Please contact our office at [phone] should you decide to have the procedure completed.

## 2016-02-27 ENCOUNTER — Telehealth: Payer: Self-pay | Admitting: Gastroenterology

## 2016-03-01 NOTE — Telephone Encounter (Signed)
Pt has been scheduled for previsit and egd, notes added to appt for pt to remain ON plavix per Dr Ardis Hughs.  See office note.

## 2016-04-14 ENCOUNTER — Ambulatory Visit (AMBULATORY_SURGERY_CENTER): Payer: Self-pay

## 2016-04-14 ENCOUNTER — Encounter: Payer: Self-pay | Admitting: Gastroenterology

## 2016-04-14 ENCOUNTER — Telehealth: Payer: Self-pay

## 2016-04-14 VITALS — Ht 68.0 in | Wt 190.6 lb

## 2016-04-14 DIAGNOSIS — R1310 Dysphagia, unspecified: Secondary | ICD-10-CM

## 2016-04-14 NOTE — Progress Notes (Signed)
No allergies to eggs or soy No past problems with anesthesia No home oxygen No diet meds  Registered for emmi  

## 2016-04-14 NOTE — Telephone Encounter (Signed)
Nathaniel [name],      Please contact MD's office for instructions regarding insulin pump and notify pt.      Thank you, Angela/PV

## 2016-04-14 NOTE — Telephone Encounter (Signed)
Insulin pump letter sent to Dr Larose Kells

## 2016-04-15 NOTE — Telephone Encounter (Signed)
I spoke with the pt and he is aware of how to manage his insulin pump prior to procedure.  He will call if he has any further concerns.

## 2016-04-18 NOTE — Telephone Encounter (Signed)
[place], thx , please consult Dr Buddy Duty (endo) if needed

## 2016-04-21 ENCOUNTER — Ambulatory Visit (AMBULATORY_SURGERY_CENTER): Payer: No Typology Code available for payment source | Admitting: Gastroenterology

## 2016-04-21 ENCOUNTER — Encounter: Payer: Self-pay | Admitting: Gastroenterology

## 2016-04-21 VITALS — BP 110/74 | HR 54 | Temp 95.3°F | Resp 12 | Ht 68.0 in | Wt 190.0 lb

## 2016-04-21 DIAGNOSIS — R1310 Dysphagia, unspecified: Secondary | ICD-10-CM | POA: Diagnosis not present

## 2016-04-21 DIAGNOSIS — K222 Esophageal obstruction: Secondary | ICD-10-CM

## 2016-04-21 MED ORDER — SODIUM CHLORIDE 0.9 % IV SOLN: 500.0000 mL | INTRAVENOUS | Status: DC

## 2016-04-21 NOTE — Progress Notes (Signed)
No changes in surgical or medical hx since PV.  Insulin pump on; room and RR nurse notified.  Pt drank "few sips of water" at [place] CRNA aware and ok to proceed.

## 2016-04-21 NOTE — Progress Notes (Signed)
Patient awakening,vss,report to rn 

## 2016-04-21 NOTE — Patient Instructions (Signed)
YOU HAD AN ENDOSCOPIC PROCEDURE TODAY AT [facility]:   Refer to the procedure report that was given to you for any specific questions about what was found during the examination.  If the procedure report does not answer your questions, please call your gastroenterologist to clarify.  If you requested that your care partner not be given the details of your procedure findings, then the procedure report has been included in a sealed envelope for you to review at your convenience later.  YOU SHOULD EXPECT: Some feelings of bloating in the abdomen. Passage of more gas than usual.  Walking can help get rid of the air that was put into your GI tract during the procedure and reduce the bloating. If you had a lower endoscopy (such as a colonoscopy or flexible sigmoidoscopy) you may notice spotting of blood in your stool or on the toilet paper. If you underwent a bowel prep for your procedure, you may not have a normal bowel movement for a few days.  Please Note:  You might notice some irritation and congestion in your nose or some drainage.  This is from the oxygen used during your procedure.  There is no need for concern and it should clear up in a day or so.  SYMPTOMS TO REPORT IMMEDIATELY:    Following upper endoscopy (EGD)  Vomiting of blood or coffee ground material  New chest pain or pain under the shoulder blades  Painful or persistently difficult swallowing  New shortness of breath  Fever of 100F or higher  Black, tarry-looking stools  For urgent or emergent issues, a gastroenterologist can be reached at any hour by calling [phone].   DIET:  We do recommend a small meal at first, but then you may proceed to your regular diet.  Drink plenty of fluids but you should avoid alcoholic beverages for 24 hours.  ACTIVITY:  You should plan to take it easy for the rest of today and you should NOT DRIVE or use heavy machinery until tomorrow (because of the sedation medicines used  during the test).    FOLLOW UP: Our staff will call the number listed on your records the next business day following your procedure to check on you and address any questions or concerns that you may have regarding the information given to you following your procedure. If we do not reach you, we will leave a message.  However, if you are feeling well and you are not experiencing any problems, there is no need to return our call.  We will assume that you have returned to your regular daily activities without incident.  If any biopsies were taken you will be contacted by phone or by letter within the next 1-3 weeks.  Please call us at [phone] if you have not heard about the biopsies in 3 weeks.    SIGNATURES/CONFIDENTIALITY: You and/or your care partner have signed paperwork which will be entered into your electronic medical record.  These signatures attest to the fact that that the information above on your After Visit Summary has been reviewed and is understood.  Full responsibility of the confidentiality of this discharge information lies with you and/or your care-partner.   Resume medications. Office will contact you with appointments.

## 2016-04-21 NOTE — Op Note (Signed)
[facility] Patient Name: Nathaniel [name] Procedure Date: 04/21/2016 9:35 AM MRN: [id number] Endoscopist: [name] , MD Age: 56 Referring MD:  Date of Birth: 06-26-1960 Gender: Male Account #: [id number] Procedure:                Upper GI endoscopy Indications:              Odynophagia without dysphagia Medicines:                Monitored Anesthesia Care Procedure:                Pre-Anesthesia Assessment:                           - Prior to the procedure, a History and Physical                            was performed, and patient medications and                            allergies were reviewed. The patient's tolerance of                            previous anesthesia was also reviewed. The risks                            and benefits of the procedure and the sedation                            options and risks were discussed with the patient.                            All questions were answered, and informed consent                            was obtained. Prior Anticoagulants: The patient has                            taken Plavix (clopidogrel), last dose was day of                            procedure. ASA Grade Assessment: II - A patient                            with mild systemic disease. After reviewing the                            risks and benefits, the patient was deemed in                            satisfactory condition to undergo the procedure.                           After obtaining informed consent, the endoscope was  passed under direct vision. Throughout the                            procedure, the patient's blood pressure, pulse, and                            oxygen saturations were monitored continuously. The                            Endoscope was introduced through the mouth, and                            advanced to the second part of duodenum. The upper                            GI endoscopy was  accomplished without difficulty.                            The patient tolerated the procedure well. Scope In: Scope Out: Findings:                 One mild benign-appearing, intrinsic stenosis was                            found at the gastroesophageal junction (focal                            peptic sticture vs. thick Schatzki's ring)                           The exam was otherwise without abnormality. Complications:            No immediate complications. Estimated blood loss:                            None. Estimated Blood Loss:     Estimated blood loss: none. Impression:               - Benign-appearing esophageal stenosis (focal                            peptic stricture vs. thick Schatzki's ring).                           - The examination was otherwise normal.                           - No specimens collected. Recommendation:           - Patient has a contact number available for                            emergencies. The signs and symptoms of potential                            delayed complications were discussed with the  patient. Return to normal activities tomorrow.                            Written discharge instructions were provided to the                            patient.                           - Resume previous diet. Chew your food well, eat                            slowly and take small bites.                           - Continue present medications.                           - Repeat upper endoscopy at the next available                            appointment at [facility] or [place] (my office will schedule                            this, you will need to hold your plavix for 5 days                            prior to the EGD/dilation and we will communicate                            with your cardiologist about the safety of holding                            it). [name], MD 04/21/2016 9:43:38 AM This report has been  signed electronically.

## 2016-04-21 NOTE — Progress Notes (Signed)
Received verbal order from Dr. Ardis Hughs to notify Patty of recommendations for pt.. Call placed and message left for Patty concerning recommendations.

## 2016-04-22 ENCOUNTER — Telehealth: Payer: Self-pay | Admitting: *Deleted

## 2016-04-22 ENCOUNTER — Telehealth: Payer: Self-pay

## 2016-04-22 DIAGNOSIS — K222 Esophageal obstruction: Secondary | ICD-10-CM | POA: Insufficient documentation

## 2016-04-22 NOTE — Telephone Encounter (Signed)
Repeat EGD at  [facility] on 05/05/16, will need to hold plavix for 5 days prior to EGD.  Anti coag letter sent to cardiology, pt is aware of insulin pump instructions.  He has been instructed and will call back if he has not heard from our office in 1 week regarding the plavix

## 2016-04-22 NOTE — Telephone Encounter (Signed)
  Follow up Call-  Call back number 04/21/2016  Post procedure Call Back phone  # [phone]  Permission to leave phone message Yes  Some recent data might be hidden     Patient questions:  Do you have a fever, pain , or abdominal swelling? No. Pain Score  0 *  Have you tolerated food without any problems? Yes.    Have you been able to return to your normal activities? Yes.    Do you have any questions about your discharge instructions: Diet   No. Medications  No. Follow up visit  No.  Do you have questions or concerns about your Care? No.  Actions: * If pain score is 4 or above: No action needed, pain <4.

## 2016-04-22 NOTE — Telephone Encounter (Signed)
He can hold his Plavix 5 days before the planned procedure.   Lauree Chandler

## 2016-04-23 NOTE — Telephone Encounter (Signed)
Pt has been advised by phone message will call on 04/28/16 to confirm

## 2016-04-28 NOTE — Telephone Encounter (Signed)
I spoke with the pt and he did confirm he is holding plavix 5 days prior to procedure.

## 2016-05-05 ENCOUNTER — Ambulatory Visit (AMBULATORY_SURGERY_CENTER): Payer: No Typology Code available for payment source | Admitting: Gastroenterology

## 2016-05-05 ENCOUNTER — Encounter: Payer: Self-pay | Admitting: Gastroenterology

## 2016-05-05 VITALS — BP 100/59 | HR 55 | Temp 96.8°F | Resp 11 | Wt 190.0 lb

## 2016-05-05 DIAGNOSIS — K222 Esophageal obstruction: Secondary | ICD-10-CM

## 2016-05-05 DIAGNOSIS — R1310 Dysphagia, unspecified: Secondary | ICD-10-CM | POA: Diagnosis not present

## 2016-05-05 MED ORDER — SODIUM CHLORIDE 0.9 % IV SOLN: 500.0000 mL | INTRAVENOUS | Status: DC

## 2016-05-05 NOTE — Progress Notes (Signed)
To recovery, report to Monday, RN, VSS 

## 2016-05-05 NOTE — Op Note (Signed)
[facility] Patient Name: Nathaniel [name] Procedure Date: 05/05/2016 10:20 AM MRN: [id number] Endoscopist: [name] , MD Age: 56 Referring MD:  Date of Birth: 17-Jun-1960 Gender: Male Account #: [id number] Procedure:                Upper GI endoscopy Indications:              Dysphagia Medicines:                Monitored Anesthesia Care Procedure:                Pre-Anesthesia Assessment:                           - Prior to the procedure, a History and Physical                            was performed, and patient medications and                            allergies were reviewed. The patient's tolerance of                            previous anesthesia was also reviewed. The risks                            and benefits of the procedure and the sedation                            options and risks were discussed with the patient.                            All questions were answered, and informed consent                            was obtained. Prior Anticoagulants: The patient has                            taken Plavix (clopidogrel), last dose was 5 days                            prior to procedure. ASA Grade Assessment: II - A                            patient with mild systemic disease. After reviewing                            the risks and benefits, the patient was deemed in                            satisfactory condition to undergo the procedure.                           After obtaining informed consent, the endoscope was  passed under direct vision. Throughout the                            procedure, the patient's blood pressure, pulse, and                            oxygen saturations were monitored continuously. The                            Endoscope was introduced through the mouth, and                            advanced to the second part of duodenum. The upper                            GI endoscopy was accomplished  without difficulty.                            The patient tolerated the procedure well. Scope In: Scope Out: Findings:                 One mild benign-appearing, intrinsic stenosis was                            found at the gastroesophageal junction (focal                            peptic stricture vs. thick Schatzki's ring). A TTS                            dilator was passed through the scope. Dilation with                            an 18-19-20 mm balloon dilator was performed to 20                            mm. There was the typical post dilation superficial                            tear and self limited oozing.                           The exam was otherwise without abnormality. Complications:            No immediate complications. Estimated blood loss:                            None. Estimated Blood Loss:     Estimated blood loss: none. Impression:               - Benign-appearing esophageal stenosis. Dilated.                           - The examination was otherwise normal.                           -  No specimens collected. Recommendation:           - Patient has a contact number available for                            emergencies. The signs and symptoms of potential                            delayed complications were discussed with the                            patient. Return to normal activities tomorrow.                            Written discharge instructions were provided to the                            patient.                           - Resume previous diet.                           - Continue present medications. OK to restart your                            plavix tomorrow.                           - Please start one OTC omeprazole (20mg ) pill; one                            pill shortly before breakfast every morning.                           - Repeat upper endoscopy PRN for retreatment. [name], MD 05/05/2016 10:36:17 AM This report has  been signed electronically.

## 2016-05-05 NOTE — Patient Instructions (Addendum)
YOU HAD AN ENDOSCOPIC PROCEDURE TODAY AT [facility]:   Refer to the procedure report that was given to you for any specific questions about what was found during the examination.  If the procedure report does not answer your questions, please call your gastroenterologist to clarify.  If you requested that your care partner not be given the details of your procedure findings, then the procedure report has been included in a sealed envelope for you to review at your convenience later.  YOU SHOULD EXPECT: Some feelings of bloating in the abdomen. Passage of more gas than usual.  Walking can help get rid of the air that was put into your GI tract during the procedure and reduce the bloating. If you had a lower endoscopy (such as a colonoscopy or flexible sigmoidoscopy) you may notice spotting of blood in your stool or on the toilet paper. If you underwent a bowel prep for your procedure, you may not have a normal bowel movement for a few days.  Please Note:  You might notice some irritation and congestion in your nose or some drainage.  This is from the oxygen used during your procedure.  There is no need for concern and it should clear up in a day or so.  SYMPTOMS TO REPORT IMMEDIATELY:    Following upper endoscopy (EGD)  Vomiting of blood or coffee ground material  New chest pain or pain under the shoulder blades  Painful or persistently difficult swallowing  New shortness of breath  Fever of 100F or higher  Black, tarry-looking stools  For urgent or emergent issues, a gastroenterologist can be reached at any hour by calling [phone]. Please start omeprazole 20 mg over the counter before breakfast every morning. You may resume plavix in the am.   DIET:  Nothing by mouth until 1130, then clear liquids until 1230, soft diet to start at 1:30 for the rest of the day, then you may proceed to your regular diet in the morning. You should avoid alcoholic beverages for 24  hours.  ACTIVITY:  You should plan to take it easy for the rest of today and you should NOT DRIVE or use heavy machinery until tomorrow (because of the sedation medicines used during the test).    FOLLOW UP: Our staff will call the number listed on your records the next business day following your procedure to check on you and address any questions or concerns that you may have regarding the information given to you following your procedure. If we do not reach you, we will leave a message.  However, if you are feeling well and you are not experiencing any problems, there is no need to return our call.  We will assume that you have returned to your regular daily activities without incident.  If any biopsies were taken you will be contacted by phone or by letter within the next 1-3 weeks.  Please call us at [phone] if you have not heard about the biopsies in 3 weeks.    SIGNATURES/CONFIDENTIALITY: You and/or your care partner have signed paperwork which will be entered into your electronic medical record.  These signatures attest to the fact that that the information above on your After Visit Summary has been reviewed and is understood.  Full responsibility of the confidentiality of this discharge information lies with you and/or your care-partner.  Thank you for letting us take care of your healthcare needs today.

## 2016-05-05 NOTE — Progress Notes (Signed)
Called to room to assist during endoscopic procedure.  Patient ID and intended procedure confirmed with present staff. Received instructions for my participation in the procedure from the performing physician.  

## 2016-05-06 ENCOUNTER — Telehealth: Payer: Self-pay | Admitting: *Deleted

## 2016-05-06 NOTE — Telephone Encounter (Signed)
  Follow up Call-  Call back number 05/05/2016 04/21/2016  Post procedure Call Back phone  # [phone]  Permission to leave phone message Yes Yes  Some recent data might be hidden     Patient questions:  Do you have a fever, pain , or abdominal swelling? No. Pain Score  0 *  Have you tolerated food without any problems? Yes.    Have you been able to return to your normal activities? Yes.    Do you have any questions about your discharge instructions: Diet   No. Medications  No. Follow up visit  No.  Do you have questions or concerns about your Care? No.  Actions: * If pain score is 4 or above: No action needed, pain <4.

## 2016-06-02 LAB — HM DIABETES EYE EXAM

## 2016-06-08 LAB — LIPID PANEL
CHOLESTEROL: 146 mg/dL (ref 0–200)
HDL: 66 mg/dL (ref 35–70)
LDL CALC: 72 mg/dL
LDL/HDL RATIO: 2.2
TRIGLYCERIDES: 41 mg/dL (ref 40–160)

## 2016-06-08 LAB — HEMOGLOBIN A1C: Hemoglobin A1C: 6.1

## 2016-06-08 LAB — BASIC METABOLIC PANEL
BUN: 21 mg/dL (ref 4–21)
Creatinine: 1.1 mg/dL (ref 0.6–1.3)
Glucose: 37 mg/dL
Potassium: 4.4 mmol/L (ref 3.4–5.3)
Sodium: 140 mmol/L (ref 137–147)

## 2016-06-08 LAB — HEPATIC FUNCTION PANEL
ALK PHOS: 70 U/L (ref 25–125)
ALT: 18 U/L (ref 10–40)
AST: 19 U/L (ref 14–40)
Bilirubin, Total: 0.7 mg/dL

## 2016-06-08 LAB — MICROALBUMIN, URINE

## 2016-06-08 LAB — TSH: TSH: 0.97 u[IU]/mL (ref 0.41–5.90)

## 2016-06-16 ENCOUNTER — Encounter: Payer: Self-pay | Admitting: Internal Medicine

## 2016-09-14 LAB — HM DIABETES FOOT EXAM: HM DIABETIC FOOT EXAM: NORMAL

## 2016-09-22 ENCOUNTER — Encounter: Payer: Self-pay | Admitting: Internal Medicine

## 2016-10-28 ENCOUNTER — Encounter (INDEPENDENT_AMBULATORY_CARE_PROVIDER_SITE_OTHER): Payer: Self-pay

## 2016-10-28 ENCOUNTER — Encounter: Payer: Self-pay | Admitting: Cardiovascular Disease

## 2016-10-28 ENCOUNTER — Ambulatory Visit (INDEPENDENT_AMBULATORY_CARE_PROVIDER_SITE_OTHER): Payer: No Typology Code available for payment source | Admitting: Cardiovascular Disease

## 2016-10-28 ENCOUNTER — Ambulatory Visit: Payer: No Typology Code available for payment source | Admitting: Cardiovascular Disease

## 2016-10-28 VITALS — BP 100/64 | HR 57 | Ht 68.0 in | Wt 185.4 lb

## 2016-10-28 DIAGNOSIS — I10 Essential (primary) hypertension: Secondary | ICD-10-CM | POA: Diagnosis not present

## 2016-10-28 DIAGNOSIS — I2510 Atherosclerotic heart disease of native coronary artery without angina pectoris: Secondary | ICD-10-CM

## 2016-10-28 DIAGNOSIS — F17201 Nicotine dependence, unspecified, in remission: Secondary | ICD-10-CM

## 2016-10-28 DIAGNOSIS — E7800 Pure hypercholesterolemia, unspecified: Secondary | ICD-10-CM

## 2016-10-28 NOTE — Progress Notes (Signed)
Chief Complaint  Patient presents with  . Follow-up    CAD    History of Present Illness: 56 y.o. male with history of DM, HTN, OSA, tobacco abuse, OSA and CAD with anterior STEMI March 2013 who is here today for cardiac follow up. He was admitted March 2013 with an anterior STEMI and was found to have occlusion of the ostial LAD. There was mild plaque in the circumflex and RCA. A Promus DES was placed in the ostial LAD. Repeat cardiac cath in October 2013 in the setting of recurrent chest pain showed a patent stent in the LAD, mild plaque in the LAD beyond the stent.  He is here today for follow up. The patient denies any chest pain, dyspnea, palpitations, lower extremity edema, orthopnea, PND, dizziness, near syncope or syncope.     Primary Care Physician: Colon Branch, MD  Past Medical History:  Diagnosis Date  . CAD (coronary artery disease)    a. ant stemi 04/2011 - totalled LAD treated w/ thrombectomy & 3.5 x 16 Promus DES.  OTW nonobs dzs.  EF 45%  . Chronic headaches    cluster  . Depression    has taken meds for depression before  . Diabetes mellitus    dx at age 15, not overweight was told he was type 1  . Hyperlipemia   . Ischemic cardiomyopathy    a. EF 45% by V-gram 04/2011;  b. Echo 04/19/2011 EF 55%, Gr 1 DD.  Marland Kitchen Myocardial infarction ([place])   . Sleep apnea    has CPAP does not use  . Tobacco abuse    reports he quit after STEMI    Past Surgical History:  Procedure Laterality Date  . Cardiac cath with PCI  04/17/2011   DES to LAD  . COLONOSCOPY WITH PROPOFOL N/A 05/16/2014   Procedure: COLONOSCOPY WITH PROPOFOL;  Surgeon: [name], MD;  Location: [facility];  Service: Endoscopy;  Laterality: N/A;  . LEFT HEART CATHETERIZATION WITH CORONARY ANGIOGRAM N/A 04/17/2011   Procedure: LEFT HEART CATHETERIZATION WITH CORONARY ANGIOGRAM;  Surgeon: Burnell Blanks, MD;  Location: [facility] CATH LAB;  Service: Cardiovascular;  Laterality: N/A;  . PERCUTANEOUS CORONARY  STENT INTERVENTION (PCI-S)  04/17/2011   Procedure: PERCUTANEOUS CORONARY STENT INTERVENTION (PCI-S);  Surgeon: Burnell Blanks, MD;  Location: [facility] CATH LAB;  Service: Cardiovascular;;  . TONSILLECTOMY    . VASECTOMY      Current Outpatient Prescriptions  Medication Sig Dispense Refill  . aspirin EC 81 MG tablet Take 81 mg by mouth every morning.     Marland Kitchen atorvastatin (LIPITOR) 20 MG tablet TAKE 1 TABLET DAILY 90 tablet 3  . budesonide (ENTOCORT EC) 3 MG 24 hr capsule Take 3 mg by mouth daily.    . carvedilol (COREG) 3.125 MG tablet TAKE 1 TABLET BY MOUTH 2 TIMES DAILY WITH A MEAL. 180 tablet 3  . clopidogrel (PLAVIX) 75 MG tablet Take 1 tablet (75 mg total) by mouth daily. 90 tablet 3  . GLUCAGON EMERGENCY 1 MG injection     . Insulin Glargine (BASAGLAR KWIKPEN) 100 UNIT/ML SOPN As directed.    Marland Kitchen lisinopril (PRINIVIL,ZESTRIL) 2.5 MG tablet TAKE 1/2 TABLET (1.25MG ) INTHE MORNING 45 tablet 3  . nitroGLYCERIN (NITROSTAT) 0.4 MG SL tablet Place 0.4 mg under the tongue every 5 (five) minutes as needed for chest pain.    Marland Kitchen NOVOLOG 100 UNIT/ML injection 1.08 Units every hour. Use as directed per sliding scale (insulin pump)    .  ONE TOUCH ULTRA TEST test strip USE AS NEEDED 100 each 1  . verapamil (CALAN) 80 MG tablet Take 80 mg by mouth as needed.     Current Facility-Administered Medications  Medication Dose Route Frequency Provider Last Rate Last Dose  . 0.9 %  sodium chloride infusion  500 mL Intravenous Continuous [name], MD      . 0.9 %  sodium chloride infusion  500 mL Intravenous Continuous [name], MD        No Known Allergies  Social History   Social History  . Marital status: Married    Spouse name: N/A  . Number of children: 1  . Years of education: N/A   Occupational History  . PRICE ANALYST Psychologist, counselling   Social History Main Topics  . Smoking status: Former Smoker    Packs/day: 0.50    Years: 15.00    Types: Cigarettes     Quit date: 04/17/2011  . Smokeless tobacco: Never Used  . Alcohol use 3.0 - 3.6 oz/week    5 - 6 Glasses of wine per week     Comment: some days   . Drug use: No  . Sexual activity: Not Currently   Other Topics Concern  . Not on file   Social History Narrative   Moved from [place] on July 2010    Household- pt and wife    Family History  Problem Relation Age of Onset  . Multiple sclerosis Mother   . Parkinsonism Father   . Stroke Father   . Coronary artery disease Neg Hx   . Hypertension Neg Hx   . Diabetes Neg Hx   . Colon cancer Neg Hx   . Prostate cancer Neg Hx   . Stomach cancer Neg Hx     Review of Systems:  As stated in the HPI and otherwise negative.   BP 100/64   Pulse (!) 57   Ht 5\' 8"  (1.727 m)   Wt 185 lb 6.4 oz (84.1 kg)   SpO2 98%   BMI 28.19 kg/m   Physical Examination:  General: Well developed, well nourished, NAD  HEENT: OP clear, mucus membranes moist  SKIN: warm, dry. No rashes. Neuro: No focal deficits  Musculoskeletal: Muscle strength 5/5 all ext  Psychiatric: Mood and affect normal  Neck: No JVD, no carotid bruits, no thyromegaly, no lymphadenopathy.  Lungs:Clear bilaterally, no wheezes, rhonci, crackles Cardiovascular: Regular rate and rhythm. No murmurs, gallops or rubs. Abdomen:Soft. Bowel sounds present. Non-tender.  Extremities: No lower extremity edema. Pulses are 2 + in the bilateral DP/PT.  EKG:  EKG is ordered today. The ekg ordered today demonstrates sinus bradycardia, rate 52 bpm.   Recent Labs: 02/11/2016: Hemoglobin 14.3; Platelets 225.0 06/08/2016: ALT 18; BUN 21; Creatinine 1.1; Potassium 4.4; Sodium 140; TSH 0.97   Lipid Panel    Component Value Date/Time   CHOL 146 06/08/2016   TRIG 41 06/08/2016   HDL 66 06/08/2016   CHOLHDL 1.8 10/27/2011 0544   VLDL 10 10/27/2011 0544   LDLCALC 72 06/08/2016   LDLDIRECT 140.0 08/25/2010 1108     Wt Readings from Last 3 Encounters:  10/28/16 185 lb 6.4 oz (84.1 kg)    05/05/16 190 lb (86.2 kg)  04/21/16 190 lb (86.2 kg)     Other studies Reviewed: Additional studies/ records that were reviewed today include: . Review of the above records demonstrates:    Assessment and Plan:   1.  CAD without angina:  Last cardiac cath 11/10/11 with patent LAD stent performed following chest pain and abnormal nuclear study. He is having no chest pain suggestive of angina. He Will continue ASA, Plavix, statin, beta blocker and Ace-inh. Will arrange echo to assess LVEF. Last echo in 2013. He is having some daytime fatigue.   2. HTN: BP well controlled. Continue current therapy  3. Tobacco abuse, in remission: He stopped smoking in 2013.  4. Hyperlipidemia: Lipids well controlled. Continue statin  5. Daytime fatigue: Sleep study ok in 2016. He has trouble sleeping. He urinates often during the night. I will arrange an echo to assess LVEF. He will contact primary care to discuss and update prostate exam.   Current medicines are reviewed at length with the patient today.  The patient does not have concerns regarding medicines.  The following changes have been made:  no change  Labs/ tests ordered today include:   Orders Placed This Encounter  Procedures  . EKG 12-Lead  . ECHOCARDIOGRAM COMPLETE    Disposition:   FU with me in 12  months  Signed, Lauree Chandler, MD 10/28/2016 8:49 AM    [facility] [place], [place], [place]  [address] Phone: [phone]; Fax: [phone]

## 2016-10-28 NOTE — Patient Instructions (Signed)

## 2016-11-08 ENCOUNTER — Ambulatory Visit (HOSPITAL_COMMUNITY): Payer: No Typology Code available for payment source | Attending: Internal Medicine

## 2016-11-08 ENCOUNTER — Other Ambulatory Visit: Payer: Self-pay

## 2016-11-08 DIAGNOSIS — I2510 Atherosclerotic heart disease of native coronary artery without angina pectoris: Secondary | ICD-10-CM | POA: Diagnosis not present

## 2016-11-09 ENCOUNTER — Encounter: Payer: Self-pay | Admitting: Internal Medicine

## 2016-11-09 ENCOUNTER — Ambulatory Visit (INDEPENDENT_AMBULATORY_CARE_PROVIDER_SITE_OTHER): Payer: No Typology Code available for payment source | Admitting: Internal Medicine

## 2016-11-09 VITALS — BP 108/72 | HR 58 | Temp 98.5°F | Resp 14 | Ht 68.0 in | Wt 186.1 lb

## 2016-11-09 DIAGNOSIS — I2583 Coronary atherosclerosis due to lipid rich plaque: Secondary | ICD-10-CM | POA: Diagnosis not present

## 2016-11-09 DIAGNOSIS — R5383 Other fatigue: Secondary | ICD-10-CM

## 2016-11-09 DIAGNOSIS — Z23 Encounter for immunization: Secondary | ICD-10-CM | POA: Diagnosis not present

## 2016-11-09 DIAGNOSIS — R351 Nocturia: Secondary | ICD-10-CM | POA: Diagnosis not present

## 2016-11-09 DIAGNOSIS — I2510 Atherosclerotic heart disease of native coronary artery without angina pectoris: Secondary | ICD-10-CM

## 2016-11-09 DIAGNOSIS — R972 Elevated prostate specific antigen [PSA]: Secondary | ICD-10-CM

## 2016-11-09 MED ORDER — TAMSULOSIN HCL 0.4 MG PO CAPS: 0.4000 mg | ORAL_CAPSULE | Freq: Every day | ORAL | 2 refills | Status: DC

## 2016-11-09 NOTE — Progress Notes (Signed)
Pre visit review using our clinic review tool, if applicable. No additional management support is needed unless otherwise documented below in the visit note. 

## 2016-11-09 NOTE — Progress Notes (Signed)
Subjective:    Patient ID: Nathaniel [name], male    DOB: 26-Aug-1960, 56 y.o.   MRN: [id number]  DOS:  11/09/2016 Type of visit - description : rov Interval history: CC today is lack of energy. He feels tired most days and sometimes exhausted. As is going on for at least one year, he discuss that with his endocrinologist and labs were done and they came back okay. Checked recently by cardiology and he was felt to be doing well. Pt believes fatigue related to nocturia, has a history of getting up to urinate at least 3-4 times every night for the last year. That is disturbing his sleep.  BP Readings from Last 3 Encounters:  11/09/16 108/72  10/28/16 100/64  05/05/16 (!) 100/59     Review of Systems He exercises okay without having to stop to rest. Denies chest pain, difficulty breathing or lower extremity edema. He continue with mild snoring No anxiety or depression No dysuria, gross hematuria or difficulty urinating.  Past Medical History:  Diagnosis Date  . CAD (coronary artery disease)    a. ant stemi 04/2011 - totalled LAD treated w/ thrombectomy & 3.5 x 16 Promus DES.  OTW nonobs dzs.  EF 45%  . Chronic headaches    cluster  . Depression    has taken meds for depression before  . Diabetes mellitus    dx at age 60, not overweight was told he was type 1  . Hyperlipemia   . Ischemic cardiomyopathy    a. EF 45% by V-gram 04/2011;  b. Echo 04/19/2011 EF 55%, Gr 1 DD.  Marland Kitchen Myocardial infarction ([place])   . Sleep apnea    has CPAP does not use  . Tobacco abuse    reports he quit after STEMI    Past Surgical History:  Procedure Laterality Date  . Cardiac cath with PCI  04/17/2011   DES to LAD  . COLONOSCOPY WITH PROPOFOL N/A 05/16/2014   Procedure: COLONOSCOPY WITH PROPOFOL;  Surgeon: [name], MD;  [facility];  Service: Endoscopy;  Laterality: N/A;  . LEFT HEART CATHETERIZATION WITH CORONARY ANGIOGRAM N/A 04/17/2011   Procedure: LEFT HEART CATHETERIZATION WITH  CORONARY ANGIOGRAM;  Surgeon: Burnell Blanks, MD;  Location: [facility] CATH LAB;  Service: Cardiovascular;  Laterality: N/A;  . PERCUTANEOUS CORONARY STENT INTERVENTION (PCI-S)  04/17/2011   Procedure: PERCUTANEOUS CORONARY STENT INTERVENTION (PCI-S);  Surgeon: Burnell Blanks, MD;  Location: [facility] CATH LAB;  Service: Cardiovascular;;  . TONSILLECTOMY    . VASECTOMY      Social History   Social History  . Marital status: Married    Spouse name: N/A  . Number of children: 1  . Years of education: N/A   Occupational History  . PRICE ANALYST Psychologist, counselling   Social History Main Topics  . Smoking status: Former Smoker    Packs/day: 0.50    Years: 15.00    Types: Cigarettes    Quit date: 04/17/2011  . Smokeless tobacco: Never Used  . Alcohol use 3.0 - 3.6 oz/week    5 - 6 Glasses of wine per week     Comment: some days   . Drug use: No  . Sexual activity: Not Currently   Other Topics Concern  . Not on file   Social History Narrative   Moved from [place] on July 2010    Household- pt and wife      Allergies as of 11/09/2016  No Known Allergies     Medication List       Accurate as of 11/09/16  2:15 PM. Always use your most recent med list.          aspirin EC 81 MG tablet Take 81 mg by mouth every morning.   atorvastatin 20 MG tablet Commonly known as:  LIPITOR TAKE 1 TABLET DAILY   BASAGLAR KWIKPEN 100 UNIT/ML Sopn As directed.   budesonide 3 MG 24 hr capsule Commonly known as:  ENTOCORT EC Take 3 mg by mouth daily.   carvedilol 3.125 MG tablet Commonly known as:  COREG TAKE 1 TABLET BY MOUTH 2 TIMES DAILY WITH A MEAL.   clopidogrel 75 MG tablet Commonly known as:  PLAVIX Take 1 tablet (75 mg total) by mouth daily.   GLUCAGON EMERGENCY 1 MG injection Generic drug:  glucagon   lisinopril 2.5 MG tablet Commonly known as:  PRINIVIL,ZESTRIL TAKE 1/2 TABLET (1.25MG ) INTHE MORNING   nitroGLYCERIN 0.4 MG SL tablet Commonly  known as:  NITROSTAT Place 0.4 mg under the tongue every 5 (five) minutes as needed for chest pain.   NOVOLOG 100 UNIT/ML injection Generic drug:  insulin aspart 1.08 Units every hour. Use as directed per sliding scale (insulin pump)   ONE TOUCH ULTRA TEST test strip Generic drug:  glucose blood USE AS NEEDED   verapamil 80 MG tablet Commonly known as:  CALAN Take 80 mg by mouth as needed.          Objective:   Physical Exam BP 108/72 (BP Location: Left Arm, Patient Position: Sitting, Cuff Size: Normal)   Pulse (!) 58   Temp 98.5 F (36.9 C) (Oral)   Resp 14   Ht 5\' 8"  (1.727 m)   Wt 186 lb 2 oz (84.4 kg)   SpO2 97%   BMI 28.30 kg/m  General:   Well developed, well nourished . NAD.  HEENT:  Normocephalic . Face symmetric, atraumatic Lungs:  CTA B Normal respiratory effort, no intercostal retractions, no accessory muscle use. Heart: RRR,  no murmur.  no pretibial edema bilaterally  Abdomen:  Not distended, soft, non-tender. No rebound or rigidity.  Skin: Not pale. Not jaundice Rectal:  External abnormalities: none. Normal sphincter tone. No rectal masses or tenderness.  No stools found Prostate: Prostate gland firm and smooth, slightly enlarged, minute nodule on the far left?, no  Tenderness  Neurologic:  alert & oriented X3.  Speech normal, gait appropriate for age and unassisted Psych--  Cognition and judgment appear intact.  Cooperative with normal attention span and concentration.  Behavior appropriate. No anxious or depressed appearing.    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 Denies episodes of frequent hypoglycemia, he has a continuous CBG monitor. He saw cardiology recently,  they are planning to do a echo  History of a sleep apnea, he had a second sleep test 09-2014 and was told it was okay. He still snores. BPs are in the low side, consider just his medications. Plan: evaluate/treat nocturia, see next  Nocturia: Going on for a while, he  wakes up at least 3 or 4 times every night, DRE w/  slt increased size prostate, minute nodule (see exam). Will check a PSA, UA, urine culture. Trial with Flomax. If not better will refer to urology. CAD: Recently seen by cardiology, felt to be doing okay, checking a BMP and CBC. Dysphagia: Status post EGD 04-2016, benign-appearing esophagus, dilated. RTC 3 months

## 2016-11-09 NOTE — Patient Instructions (Signed)
GO TO THE LAB : Get the blood work     GO TO THE FRONT DESK Schedule your next appointment for a  Physical exam in 3 months  Start Flomax at night.

## 2016-11-10 LAB — PSA: PSA: 13.64 ng/mL — AB (ref 0.10–4.00)

## 2016-11-10 LAB — URINALYSIS, ROUTINE W REFLEX MICROSCOPIC
BILIRUBIN URINE: NEGATIVE
HGB URINE DIPSTICK: NEGATIVE
Ketones, ur: NEGATIVE
LEUKOCYTES UA: NEGATIVE
NITRITE: NEGATIVE
PH: 6.5 (ref 5.0–8.0)
RBC / HPF: NONE SEEN (ref 0–?)
Specific Gravity, Urine: 1.01 (ref 1.000–1.030)
Total Protein, Urine: NEGATIVE
URINE GLUCOSE: NEGATIVE
Urobilinogen, UA: 0.2 (ref 0.0–1.0)
WBC, UA: NONE SEEN (ref 0–?)

## 2016-11-10 LAB — URINE CULTURE
MICRO NUMBER:: 81092307
Result:: NO GROWTH
SPECIMEN QUALITY:: ADEQUATE

## 2016-11-10 LAB — CBC WITH DIFFERENTIAL/PLATELET
BASOS ABS: 0.1 10*3/uL (ref 0.0–0.1)
Basophils Relative: 1.1 % (ref 0.0–3.0)
EOS ABS: 0.3 10*3/uL (ref 0.0–0.7)
Eosinophils Relative: 6 % — ABNORMAL HIGH (ref 0.0–5.0)
HCT: 40.2 % (ref 39.0–52.0)
HEMOGLOBIN: 13.4 g/dL (ref 13.0–17.0)
Lymphocytes Relative: 23.4 % (ref 12.0–46.0)
Lymphs Abs: 1.2 10*3/uL (ref 0.7–4.0)
MCHC: 33.3 g/dL (ref 30.0–36.0)
MCV: 98.3 fl (ref 78.0–100.0)
MONO ABS: 0.6 10*3/uL (ref 0.1–1.0)
Monocytes Relative: 10.6 % (ref 3.0–12.0)
NEUTROS PCT: 58.9 % (ref 43.0–77.0)
Neutro Abs: 3.1 10*3/uL (ref 1.4–7.7)
Platelets: 243 10*3/uL (ref 150.0–400.0)
RBC: 4.09 Mil/uL — AB (ref 4.22–5.81)
RDW: 13.8 % (ref 11.5–15.5)
WBC: 5.3 10*3/uL (ref 4.0–10.5)

## 2016-11-10 LAB — BASIC METABOLIC PANEL
BUN: 19 mg/dL (ref 6–23)
CALCIUM: 9.3 mg/dL (ref 8.4–10.5)
CO2: 27 mEq/L (ref 19–32)
Chloride: 102 mEq/L (ref 96–112)
Creatinine, Ser: 1.11 mg/dL (ref 0.40–1.50)
GFR: 72.74 mL/min (ref >60.00)
GLUCOSE: 134 mg/dL — AB (ref 70–99)
Potassium: 4.8 mEq/L (ref 3.5–5.1)
Sodium: 136 mEq/L (ref 135–145)

## 2016-11-10 MED ORDER — CIPROFLOXACIN HCL 250 MG PO TABS: 250.0000 mg | ORAL_TABLET | Freq: Two times a day (BID) | ORAL | 0 refills | Status: DC

## 2016-11-10 NOTE — Assessment & Plan Note (Signed)
Fatigue: Main concern today is fatigue, this is going on for more than a year,   Endocrinologist check TSH, testosterone, other tests and they came back okay. Denies episodes of frequent hypoglycemia, he has a continuous CBG monitor. He saw cardiology recently,  they are planning to do a echo  History of a sleep apnea, he had a second sleep test 09-2014 and was told it was okay. He still snores. BPs are in the low side, consider just his medications. Plan: evaluate/treat nocturia, see next  Nocturia: Going on for a while, he wakes up at least 3 or 4 times every night, DRE w/  slt increased size prostate, minute nodule (see exam). Will check a PSA, UA, urine culture. Trial with Flomax. If not better will refer to urology. CAD: Recently seen by cardiology, felt to be doing okay, checking a BMP and CBC. Dysphagia: Status post EGD 04-2016, benign-appearing esophagus, dilated. RTC 3 months

## 2016-11-12 ENCOUNTER — Other Ambulatory Visit: Payer: Self-pay | Admitting: Cardiovascular Disease

## 2016-11-18 ENCOUNTER — Ambulatory Visit: Payer: Self-pay | Admitting: Medical

## 2016-11-18 ENCOUNTER — Telehealth: Payer: Self-pay | Admitting: Internal Medicine

## 2016-11-18 NOTE — Telephone Encounter (Signed)
Spoke with patient he stated that he only has a rash and that he would be ok. He was advised that he go to the ED he refused, however he made an appointment with his PCP for tomorrow.

## 2016-11-18 NOTE — Telephone Encounter (Signed)
[facility] - Client TELEPHONE ADVICE RECORD [facility]  Patient Name: Nathaniel [name]  DOB: [birth year]    Initial Comment Caller states he's having allergic reaction, he was put on Cipo. He now has a rash all over, and had got a tense headache.   Nurse Assessment  Nurse: Arthor Captain, RN, Margaret Date/Time (Eastern Time): 11/18/2016 2:19:15 PM  Confirm and document reason for call. If symptomatic, describe symptoms. ---Caller states that he was started on Cipro and now has a rash on his arms, and spotty bruising on arms, and on legs. Small bruises. Denies fever. Was started on Cipro 9 or 10 days ago.  Does the patient have any new or worsening symptoms? ---Yes  Will a triage be completed? ---Yes  Related visit to physician within the last 2 weeks? ---Yes  Does the PT have any chronic conditions? (i.e. diabetes, asthma, etc.) ---Yes  List chronic conditions. ---diabetes  Is this a behavioral health or substance abuse call? ---No     Guidelines    Guideline Title Affirmed Question Affirmed Notes  Rash - Widespread On Drugs [1] Purple or blood-colored rash (spots or dots) AND [2] no fever AND [3] sounds well to triager    Final Disposition User   See Physician within 4 Hours (or PCP triage) Cockrum, RN, Joycelyn Schmid    Comments  patient states he has finger tip size bruises that do not blanch   Referrals  REFERRED TO PCP OFFICE   Caller Disagree/Comply Comply  Caller Understands Yes  PreDisposition Home Care

## 2016-11-19 ENCOUNTER — Ambulatory Visit (INDEPENDENT_AMBULATORY_CARE_PROVIDER_SITE_OTHER): Payer: No Typology Code available for payment source | Admitting: Internal Medicine

## 2016-11-19 ENCOUNTER — Encounter: Payer: Self-pay | Admitting: Internal Medicine

## 2016-11-19 VITALS — BP 124/68 | HR 62 | Temp 97.6°F | Resp 14 | Ht 68.0 in | Wt 186.2 lb

## 2016-11-19 DIAGNOSIS — R972 Elevated prostate specific antigen [PSA]: Secondary | ICD-10-CM | POA: Diagnosis not present

## 2016-11-19 DIAGNOSIS — T7840XA Allergy, unspecified, initial encounter: Secondary | ICD-10-CM

## 2016-11-19 LAB — CBC WITH DIFFERENTIAL/PLATELET
BASOS ABS: 0.1 10*3/uL (ref 0.0–0.1)
Basophils Relative: 1.2 % (ref 0.0–3.0)
EOS ABS: 0.3 10*3/uL (ref 0.0–0.7)
Eosinophils Relative: 7 % — ABNORMAL HIGH (ref 0.0–5.0)
HEMATOCRIT: 40.4 % (ref 39.0–52.0)
Hemoglobin: 13.5 g/dL (ref 13.0–17.0)
LYMPHS ABS: 1.1 10*3/uL (ref 0.7–4.0)
Lymphocytes Relative: 23 % (ref 12.0–46.0)
MCHC: 33.3 g/dL (ref 30.0–36.0)
MCV: 99.1 fl (ref 78.0–100.0)
MONO ABS: 0.4 10*3/uL (ref 0.1–1.0)
MONOS PCT: 9.2 % (ref 3.0–12.0)
Neutro Abs: 2.8 10*3/uL (ref 1.4–7.7)
Neutrophils Relative %: 59.6 % (ref 43.0–77.0)
PLATELETS: 237 10*3/uL (ref 150.0–400.0)
RBC: 4.08 Mil/uL — AB (ref 4.22–5.81)
RDW: 13.7 % (ref 11.5–15.5)
WBC: 4.7 10*3/uL (ref 4.0–10.5)

## 2016-11-19 NOTE — Progress Notes (Signed)
Subjective:    Patient ID: Nathaniel [name], male    DOB: 07/13/1960, 56 y.o.   MRN: [id number]  DOS:  11/19/2016 Type of visit - description : acute Interval history: After the last visit, PSA was elevated, he was rx Cipro and tamsulosin.  The next day he started the medication had a headache, lasted 10 hours, not the worst of his life, frontal, no associated nausea, vomiting, diplopia, facial paresthesias or motor deficit. Headache is now largely resolved. Also, 2 days after he started the medications developed an itchy rash at the right arm along with some bruises. Denies generalized itching, palmar or plantar itching. He eventually stopped antibiotics yesterday.   Review of Systems  urinary symptoms unchange No fever chills No shortness of breath No cough or wheezing No lip or tongue swelling  Past Medical History:  Diagnosis Date  . CAD (coronary artery disease)    a. ant stemi 04/2011 - totalled LAD treated w/ thrombectomy & 3.5 x 16 Promus DES.  OTW nonobs dzs.  EF 45%  . Chronic headaches    cluster  . Depression    has taken meds for depression before  . Diabetes mellitus    dx at age 35, not overweight was told he was type 1  . Hyperlipemia   . Ischemic cardiomyopathy    a. EF 45% by V-gram 04/2011;  b. Echo 04/19/2011 EF 55%, Gr 1 DD.  Marland Kitchen Myocardial infarction ([place])   . Sleep apnea    has CPAP does not use  . Tobacco abuse    reports he quit after STEMI    Past Surgical History:  Procedure Laterality Date  . Cardiac cath with PCI  04/17/2011   DES to LAD  . COLONOSCOPY WITH PROPOFOL N/A 05/16/2014   Procedure: COLONOSCOPY WITH PROPOFOL;  Surgeon: [name], MD;  Location: [facility];  Service: Endoscopy;  Laterality: N/A;  . LEFT HEART CATHETERIZATION WITH CORONARY ANGIOGRAM N/A 04/17/2011   Procedure: LEFT HEART CATHETERIZATION WITH CORONARY ANGIOGRAM;  Surgeon: Burnell Blanks, MD;  Location: [facility] CATH LAB;  Service: Cardiovascular;  Laterality:  N/A;  . PERCUTANEOUS CORONARY STENT INTERVENTION (PCI-S)  04/17/2011   Procedure: PERCUTANEOUS CORONARY STENT INTERVENTION (PCI-S);  Surgeon: Burnell Blanks, MD;  Location: [facility] CATH LAB;  Service: Cardiovascular;;  . TONSILLECTOMY    . VASECTOMY      Social History   Social History  . Marital status: Married    Spouse name: N/A  . Number of children: 1  . Years of education: N/A   Occupational History  . PRICE ANALYST Psychologist, counselling   Social History Main Topics  . Smoking status: Former Smoker    Packs/day: 0.50    Years: 15.00    Types: Cigarettes    Quit date: 04/17/2011  . Smokeless tobacco: Never Used  . Alcohol use 3.0 - 3.6 oz/week    5 - 6 Glasses of wine per week     Comment: 2 glasses of wine most nights  . Drug use: No  . Sexual activity: Not Currently   Other Topics Concern  . Not on file   Social History Narrative   Moved from [place] on July 2010    Household- pt and wife      Allergies as of 11/19/2016   No Known Allergies     Medication List       Accurate as of 11/19/16 11:59 PM. Always use your most recent  med list.          aspirin EC 81 MG tablet Take 81 mg by mouth every morning.   atorvastatin 20 MG tablet Commonly known as:  LIPITOR TAKE 1 TABLET DAILY   BASAGLAR KWIKPEN 100 UNIT/ML Sopn As directed.   budesonide 3 MG 24 hr capsule Commonly known as:  ENTOCORT EC Take 3 mg by mouth daily.   carvedilol 3.125 MG tablet Commonly known as:  COREG TAKE 1 TABLET BY MOUTH 2 TIMES DAILY WITH A MEAL.   clopidogrel 75 MG tablet Commonly known as:  PLAVIX Take 1 tablet (75 mg total) by mouth daily.   FREESTYLE LIBRE READER Devi by Does not apply route. Check as directed by [place] by Does not apply route. Check as directed   GLUCAGON EMERGENCY 1 MG injection Generic drug:  glucagon   lisinopril 2.5 MG tablet Commonly known as:  PRINIVIL,ZESTRIL TAKE 1/2 TABLET  (1.25MG ) INTHE MORNING   nitroGLYCERIN 0.4 MG SL tablet Commonly known as:  NITROSTAT Place 0.4 mg under the tongue every 5 (five) minutes as needed for chest pain.   NOVOLOG 100 UNIT/ML injection Generic drug:  insulin aspart 1.08 Units every hour. Use as directed per sliding scale (insulin pump)   verapamil 80 MG tablet Commonly known as:  CALAN Take 80 mg by mouth as needed.          Objective:   Physical Exam BP 124/68 (BP Location: Left Arm, Patient Position: Sitting, Cuff Size: Small)   Pulse 62   Temp 97.6 F (36.4 C) (Oral)   Resp 14   Ht 5\' 8"  (1.727 m)   Wt 186 lb 4 oz (84.5 kg)   SpO2 97%   BMI 28.32 kg/m  General:   Well developed, well nourished . NAD.  HEENT:  Normocephalic . Face symmetric, atraumatic Neck: Full range of motion Heart: RRR,  no murmur.  No pretibial edema bilaterally  Skin:  + Rash at the right arm, see picture, rash is somewhat linear. He has 3 small ecchymosis as well. Neurologic:  alert & oriented X3.  Speech normal, gait appropriate for age and unassisted Psych--  Cognition and judgment appear intact.  Cooperative with normal attention span and concentration.  Behavior appropriate. No anxious or depressed appearing.        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 The rash looks more consistent with poison ivy. At this point I recommend to stop Cipro and tamsulosin. Take Claritin and use OTC hydrocortisone cream 1%. Call if not improving. Will check a CBC to be  sure there is no thrombocytopenia in the context of ecchymosis. Elevated PSA: Last PSA was 13.6, previous PSA was 4.6 ~ 6 years ago. Was prescribed antibiotics, see above. Has been referred to urology. Symptoms are not subjectively better after several days of abx/tamsulosin. Stop medications.

## 2016-11-19 NOTE — Patient Instructions (Addendum)
GO TO THE LAB : Get the blood work     Stop ciprofloxacin  Stop tamsulosin  If you don't hear from the urologist within the next few days please let me know

## 2016-11-19 NOTE — Progress Notes (Signed)
Pre visit review using our clinic review tool, if applicable. No additional management support is needed unless otherwise documented below in the visit note. 

## 2016-11-20 NOTE — Assessment & Plan Note (Signed)
Allergic reaction to Cipro or tamsulosin?Marland Kitchen Not much evidence on clinical grounds of an allergic reaction, on further questioning, he did some yard work before the onset of the rash and itching at the arm. The rash looks more consistent with poison ivy. At this point I recommend to stop Cipro and tamsulosin. Take Claritin and use OTC hydrocortisone cream 1%. Call if not improving. Will check a CBC to be sure there is no thrombocytopenia in the context of ecchymosis. Elevated PSA: Last PSA was 13.6, previous PSA was 4.6 ~ 6 years ago. Was prescribed antibiotics, see above. Has been referred to urology. Symptoms are not subjectively better after several days of abx/tamsulosin. Stop medications.

## 2016-12-28 ENCOUNTER — Encounter: Payer: Self-pay | Admitting: Cardiovascular Disease

## 2016-12-28 ENCOUNTER — Telehealth: Payer: Self-pay | Admitting: *Deleted

## 2016-12-28 LAB — PSA: PSA: 12.3

## 2016-12-28 NOTE — Telephone Encounter (Signed)
Letter faxed to [facility]  °

## 2016-12-28 NOTE — Telephone Encounter (Signed)
See my letter. Can we fax it? Thanks, chris

## 2016-12-28 NOTE — Telephone Encounter (Signed)
Fax received in office from [facility] (Dr. Gloriann Loan). Fax states "Clearance needed for patient to come off of his Plavix 7 days prior to prostate biopsy and his aspirin 5 days prior." Return fax number is [phone]

## 2017-01-13 ENCOUNTER — Other Ambulatory Visit: Payer: Self-pay | Admitting: Cardiovascular Disease

## 2017-01-21 ENCOUNTER — Ambulatory Visit: Payer: No Typology Code available for payment source | Admitting: Cardiovascular Disease

## 2017-02-05 ENCOUNTER — Other Ambulatory Visit: Payer: Self-pay | Admitting: Cardiovascular Disease

## 2017-02-10 ENCOUNTER — Ambulatory Visit (INDEPENDENT_AMBULATORY_CARE_PROVIDER_SITE_OTHER): Payer: No Typology Code available for payment source | Admitting: Internal Medicine

## 2017-02-10 ENCOUNTER — Encounter: Payer: Self-pay | Admitting: Internal Medicine

## 2017-02-10 VITALS — BP 118/72 | HR 53 | Temp 97.6°F | Resp 14 | Ht 68.0 in | Wt 185.2 lb

## 2017-02-10 DIAGNOSIS — I2510 Atherosclerotic heart disease of native coronary artery without angina pectoris: Secondary | ICD-10-CM | POA: Diagnosis not present

## 2017-02-10 DIAGNOSIS — R972 Elevated prostate specific antigen [PSA]: Secondary | ICD-10-CM | POA: Diagnosis not present

## 2017-02-10 DIAGNOSIS — E109 Type 1 diabetes mellitus without complications: Secondary | ICD-10-CM

## 2017-02-10 DIAGNOSIS — Z114 Encounter for screening for human immunodeficiency virus [HIV]: Secondary | ICD-10-CM | POA: Diagnosis not present

## 2017-02-10 DIAGNOSIS — E785 Hyperlipidemia, unspecified: Secondary | ICD-10-CM

## 2017-02-10 DIAGNOSIS — Z23 Encounter for immunization: Secondary | ICD-10-CM | POA: Diagnosis not present

## 2017-02-10 DIAGNOSIS — K52839 Microscopic colitis, unspecified: Secondary | ICD-10-CM | POA: Diagnosis not present

## 2017-02-10 DIAGNOSIS — Z0000 Encounter for general adult medical examination without abnormal findings: Secondary | ICD-10-CM

## 2017-02-10 DIAGNOSIS — Z1159 Encounter for screening for other viral diseases: Secondary | ICD-10-CM

## 2017-02-10 LAB — ALT: ALT: 18 U/L (ref 0–53)

## 2017-02-10 LAB — AST: AST: 19 U/L (ref 0–37)

## 2017-02-10 NOTE — Progress Notes (Signed)
Subjective:    Patient ID: Nathaniel [name], male    DOB: Mar 06, 1960, 57 y.o.   MRN: [id number]  DOS:  02/10/2017 Type of visit - description : CPX Interval history: In general feeling well, he does have some concerns   Review of Systems 2 months history of left eye being watery.  Vision is okay, some itching. Continue with mild L UTS History of microscopic colitis, has diarrhea on and off, no blood in the stools, symptoms respond to Entocort as needed.  Other than above, a 14 point review of systems is negative     Past Medical History:  Diagnosis Date  . CAD (coronary artery disease)    a. ant stemi 04/2011 - totalled LAD treated w/ thrombectomy & 3.5 x 16 Promus DES.  OTW nonobs dzs.  EF 45%  . Chronic headaches    cluster  . Depression    has taken meds for depression before  . Diabetes mellitus    dx at age 2, not overweight was told he was type 1  . Hyperlipemia   . Ischemic cardiomyopathy    a. EF 45% by V-gram 04/2011;  b. Echo 04/19/2011 EF 55%, Gr 1 DD.  Marland Kitchen Myocardial infarction ([place])   . Sleep apnea    has CPAP does not use  . Tobacco abuse    reports he quit after STEMI    Past Surgical History:  Procedure Laterality Date  . Cardiac cath with PCI  04/17/2011   DES to LAD  . COLONOSCOPY WITH PROPOFOL N/A 05/16/2014   Procedure: COLONOSCOPY WITH PROPOFOL;  Surgeon: [name], MD;  Location: [facility];  Service: Endoscopy;  Laterality: N/A;  . LEFT HEART CATHETERIZATION WITH CORONARY ANGIOGRAM N/A 04/17/2011   Procedure: LEFT HEART CATHETERIZATION WITH CORONARY ANGIOGRAM;  Surgeon: Burnell Blanks, MD;  Location: [facility] CATH LAB;  Service: Cardiovascular;  Laterality: N/A;  . PERCUTANEOUS CORONARY STENT INTERVENTION (PCI-S)  04/17/2011   Procedure: PERCUTANEOUS CORONARY STENT INTERVENTION (PCI-S);  Surgeon: Burnell Blanks, MD;  Location: [facility] CATH LAB;  Service: Cardiovascular;;  . TONSILLECTOMY    . VASECTOMY      Social History   Socioeconomic  History  . Marital status: Married    Spouse name: Not on file  . Number of children: 1  . Years of education: Not on file  . Highest education level: Not on file  Social Needs  . Financial resource strain: Not on file  . Food insecurity - worry: Not on file  . Food insecurity - inability: Not on file  . Transportation needs - medical: Not on file  . Transportation needs - non-medical: Not on file  Occupational History  . Occupation: PRICE ANALYST    Employer: [employer]    Comment: [employer]  Tobacco Use  . Smoking status: Former Smoker    Packs/day: 0.50    Years: 15.00    Pack years: 7.50    Types: Cigarettes    Last attempt to quit: 04/17/2011    Years since quitting: 5.8  . Smokeless tobacco: Never Used  Substance and Sexual Activity  . Alcohol use: Yes    Alcohol/week: 3.0 - 3.6 oz    Types: 5 - 6 Glasses of wine per week    Comment: 2 glasses of wine most nights  . Drug use: No  . Sexual activity: Not Currently  Other Topics Concern  . Not on file  Social History Narrative   Moved from [place] on  July 2010    Household- pt and wife     Family History  Problem Relation Age of Onset  . Multiple sclerosis Mother   . Parkinsonism Father   . Stroke Father   . Coronary artery disease Neg Hx   . Hypertension Neg Hx   . Diabetes Neg Hx   . Colon cancer Neg Hx   . Prostate cancer Neg Hx   . Stomach cancer Neg Hx      Allergies as of 02/10/2017   No Known Allergies     Medication List        Accurate as of 02/10/17 11:59 PM. Always use your most recent med list.          aspirin EC 81 MG tablet Take 81 mg by mouth every morning.   atorvastatin 20 MG tablet Commonly known as:  LIPITOR TAKE 1 TABLET DAILY   BASAGLAR KWIKPEN 100 UNIT/ML Sopn As directed.   budesonide 3 MG 24 hr capsule Commonly known as:  ENTOCORT EC Take 3 mg by mouth daily.   carvedilol 3.125 MG tablet Commonly known as:  COREG TAKE 1 TABLET BY MOUTH 2 TIMES DAILY  WITH A MEAL.   clopidogrel 75 MG tablet Commonly known as:  PLAVIX TAKE 1 TABLET (75 MG TOTAL) BY MOUTH DAILY.   FREESTYLE LIBRE READER Devi by Does not apply route. Check as directed by [place] by Does not apply route. Check as directed   GLUCAGON EMERGENCY 1 MG injection Generic drug:  glucagon   lisinopril 2.5 MG tablet Commonly known as:  PRINIVIL,ZESTRIL TAKE 1/2 TABLET (1.25MG ) INTHE MORNING   nitroGLYCERIN 0.4 MG SL tablet Commonly known as:  NITROSTAT Place 0.4 mg under the tongue every 5 (five) minutes as needed for chest pain.   NOVOLOG 100 UNIT/ML injection Generic drug:  insulin aspart 1.08 Units every hour. Use as directed per sliding scale (insulin pump)   verapamil 80 MG tablet Commonly known as:  CALAN Take 80 mg by mouth as needed.          Objective:   Physical Exam BP 118/72 (BP Location: Right Arm, Patient Position: Sitting, Cuff Size: Small)   Pulse (!) 53   Temp 97.6 F (36.4 C) (Oral)   Resp 14   Ht 5\' 8"  (1.727 m)   Wt 185 lb 4 oz (84 kg)   SpO2 97%   BMI 28.17 kg/m  .General:   Well developed, well nourished . NAD.  Neck: No  thyromegaly  HEENT:  Normocephalic . Face symmetric, atraumatic. Left eye is a slightly watery but not red.  EOMI, pupils equal and reactive, anterior chambers normal. Lungs:  CTA B Normal respiratory effort, no intercostal retractions, no accessory muscle use. Heart: RRR,  no murmur.  No pretibial edema bilaterally  Abdomen:  Not distended, soft, non-tender. No rebound or rigidity.   Skin: Exposed areas without rash. Not pale. Not jaundice Neurologic:  alert & oriented X3.  Speech normal, gait appropriate for age and unassisted Strength symmetric and appropriate for age.  Psych: Cognition and judgment appear intact.  Cooperative with normal attention span and concentration.  Behavior appropriate. No anxious or depressed appearing.  pecom Assessment & Plan:    Assessment DM--endocrinology, Dr Buddy Duty Hyperlipidemia CAD, sees cards  "STEMI March 2013 : occlusion of the ostial LAD. There was mild plaque in the circumflex and RCA. A Promus DES was placed in the ostial LAD. 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 Hyperlipidemia: LDL 06/2016 was 72.  Continue Lipitor CAD: Asx.  Holding aspirin and Plavix in preparation for a prostate biopsy Microscopic colitis: Occasional symptoms on Entocort as needed.  Last visit with GI 02-2016, was noted to be intolerant to mesalamine. Increased PSA: To have a BX next week. RTC 1 year.

## 2017-02-10 NOTE — Assessment & Plan Note (Addendum)
-  Td 2012; pnm 23: 2015; pnm 13 today; had a flu shot  Had a zostavax per pt ; shingrix dscussed -CCS:  Cscope 05-16-14: No polyps, BX microscopic colitis. - prostate ca screening : elevated PSA to have a BX -Diet. Exercise: discussed   -labs reviewed, due for LFTs, hep C and HIV

## 2017-02-10 NOTE — Patient Instructions (Signed)
GO TO THE LAB : Get the blood work     GO TO THE FRONT DESK Schedule your next appointment   for a physical exam in 1 year 

## 2017-02-10 NOTE — Progress Notes (Signed)
Pre visit review using our clinic review tool, if applicable. No additional management support is needed unless otherwise documented below in the visit note. 

## 2017-02-11 LAB — HEPATITIS C ANTIBODY
Hepatitis C Ab: NONREACTIVE
SIGNAL TO CUT-OFF: 0.01 (ref <1.00)

## 2017-02-11 LAB — HIV ANTIBODY (ROUTINE TESTING W REFLEX): HIV: NONREACTIVE

## 2017-02-11 NOTE — Assessment & Plan Note (Signed)
DM: f/u per endo, last available note is from few months ago, A1c was 6.1. Hyperlipidemia: LDL 06/2016 was 72.  Continue Lipitor CAD: Asx.  Holding aspirin and Plavix in preparation for a prostate biopsy Microscopic colitis: Occasional symptoms on Entocort as needed.  Last visit with GI 02-2016, was noted to be intolerant to mesalamine. Increased PSA: To have a BX next week. RTC 1 year.

## 2017-03-03 ENCOUNTER — Other Ambulatory Visit: Payer: Self-pay | Admitting: *Deleted

## 2017-03-03 MED ORDER — ATORVASTATIN CALCIUM 20 MG PO TABS: 20.0000 mg | ORAL_TABLET | Freq: Every day | ORAL | 1 refills | Status: DC

## 2017-03-04 ENCOUNTER — Other Ambulatory Visit: Payer: Self-pay | Admitting: Urology

## 2017-03-04 ENCOUNTER — Other Ambulatory Visit: Payer: Self-pay | Admitting: Gastroenterology

## 2017-03-04 DIAGNOSIS — C61 Malignant neoplasm of prostate: Secondary | ICD-10-CM

## 2017-03-11 ENCOUNTER — Encounter: Payer: Self-pay | Admitting: Internal Medicine

## 2017-03-11 DIAGNOSIS — C61 Malignant neoplasm of prostate: Secondary | ICD-10-CM | POA: Insufficient documentation

## 2017-03-21 ENCOUNTER — Encounter (HOSPITAL_COMMUNITY)
Admission: RE | Admit: 2017-03-21 | Discharge: 2017-03-21 | Disposition: A | Discharge status: Home / Self Care | Payer: No Typology Code available for payment source | Source: Ambulatory Visit | Attending: Urology | Admitting: Urology

## 2017-03-21 DIAGNOSIS — C61 Malignant neoplasm of prostate: Secondary | ICD-10-CM | POA: Insufficient documentation

## 2017-03-21 MED ORDER — TECHNETIUM TC 99M MEDRONATE IV KIT
25.0000 | PACK | Freq: Once | INTRAVENOUS | Status: AC | PRN
  Administered 2017-03-21: 21.6 via INTRAVENOUS

## 2017-03-25 ENCOUNTER — Other Ambulatory Visit: Payer: Self-pay | Admitting: Urology

## 2017-03-25 ENCOUNTER — Ambulatory Visit (HOSPITAL_COMMUNITY)
Admission: RE | Admit: 2017-03-25 | Discharge: 2017-03-25 | Disposition: A | Discharge status: Home / Self Care | Payer: No Typology Code available for payment source | Source: Ambulatory Visit | Attending: Urology | Admitting: Urology

## 2017-03-25 DIAGNOSIS — C61 Malignant neoplasm of prostate: Secondary | ICD-10-CM | POA: Insufficient documentation

## 2017-03-28 ENCOUNTER — Encounter (HOSPITAL_BASED_OUTPATIENT_CLINIC_OR_DEPARTMENT_OTHER): Payer: Self-pay

## 2017-03-28 ENCOUNTER — Other Ambulatory Visit: Payer: Self-pay

## 2017-03-28 ENCOUNTER — Encounter: Payer: Self-pay | Admitting: Internal Medicine

## 2017-03-28 ENCOUNTER — Ambulatory Visit: Payer: No Typology Code available for payment source | Admitting: Internal Medicine

## 2017-03-28 ENCOUNTER — Emergency Department (HOSPITAL_BASED_OUTPATIENT_CLINIC_OR_DEPARTMENT_OTHER): Payer: No Typology Code available for payment source

## 2017-03-28 ENCOUNTER — Emergency Department (HOSPITAL_BASED_OUTPATIENT_CLINIC_OR_DEPARTMENT_OTHER)
Admission: EM | Admit: 2017-03-28 | Discharge: 2017-03-28 | Disposition: A | Discharge status: Home / Self Care | Payer: No Typology Code available for payment source | Attending: Emergency Medicine | Admitting: Emergency Medicine

## 2017-03-28 VITALS — BP 116/74 | HR 79 | Temp 97.9°F | Resp 14 | Ht 68.0 in | Wt 186.5 lb

## 2017-03-28 DIAGNOSIS — Z7982 Long term (current) use of aspirin: Secondary | ICD-10-CM | POA: Insufficient documentation

## 2017-03-28 DIAGNOSIS — Z8546 Personal history of malignant neoplasm of prostate: Secondary | ICD-10-CM | POA: Diagnosis not present

## 2017-03-28 DIAGNOSIS — E119 Type 2 diabetes mellitus without complications: Secondary | ICD-10-CM | POA: Insufficient documentation

## 2017-03-28 DIAGNOSIS — I255 Ischemic cardiomyopathy: Secondary | ICD-10-CM | POA: Diagnosis not present

## 2017-03-28 DIAGNOSIS — R1084 Generalized abdominal pain: Secondary | ICD-10-CM

## 2017-03-28 DIAGNOSIS — Z794 Long term (current) use of insulin: Secondary | ICD-10-CM | POA: Diagnosis not present

## 2017-03-28 DIAGNOSIS — J069 Acute upper respiratory infection, unspecified: Secondary | ICD-10-CM | POA: Diagnosis not present

## 2017-03-28 DIAGNOSIS — R112 Nausea with vomiting, unspecified: Secondary | ICD-10-CM | POA: Insufficient documentation

## 2017-03-28 DIAGNOSIS — B349 Viral infection, unspecified: Secondary | ICD-10-CM

## 2017-03-28 DIAGNOSIS — Z7902 Long term (current) use of antithrombotics/antiplatelets: Secondary | ICD-10-CM | POA: Diagnosis not present

## 2017-03-28 DIAGNOSIS — I252 Old myocardial infarction: Secondary | ICD-10-CM | POA: Insufficient documentation

## 2017-03-28 DIAGNOSIS — E785 Hyperlipidemia, unspecified: Secondary | ICD-10-CM | POA: Diagnosis not present

## 2017-03-28 DIAGNOSIS — Z87891 Personal history of nicotine dependence: Secondary | ICD-10-CM | POA: Diagnosis not present

## 2017-03-28 DIAGNOSIS — E860 Dehydration: Secondary | ICD-10-CM | POA: Diagnosis not present

## 2017-03-28 DIAGNOSIS — R1013 Epigastric pain: Secondary | ICD-10-CM | POA: Diagnosis present

## 2017-03-28 LAB — CBC WITH DIFFERENTIAL/PLATELET
Basophils Absolute: 0 10*3/uL (ref 0.0–0.1)
Basophils Relative: 0 %
EOS PCT: 1 %
Eosinophils Absolute: 0 10*3/uL (ref 0.0–0.7)
HCT: 44.6 % (ref 39.0–52.0)
Hemoglobin: 15 g/dL (ref 13.0–17.0)
LYMPHS ABS: 0.6 10*3/uL — AB (ref 0.7–4.0)
LYMPHS PCT: 12 %
MCH: 32.5 pg (ref 26.0–34.0)
MCHC: 33.6 g/dL (ref 30.0–36.0)
MCV: 96.5 fL (ref 78.0–100.0)
MONO ABS: 0.8 10*3/uL (ref 0.1–1.0)
Monocytes Relative: 15 %
Neutro Abs: 3.9 10*3/uL (ref 1.7–7.7)
Neutrophils Relative %: 72 %
PLATELETS: 182 10*3/uL (ref 150–400)
RBC: 4.62 MIL/uL (ref 4.22–5.81)
RDW: 12.9 % (ref 11.5–15.5)
WBC: 5.3 10*3/uL (ref 4.0–10.5)

## 2017-03-28 LAB — COMPREHENSIVE METABOLIC PANEL
ALBUMIN: 4.3 g/dL (ref 3.5–5.0)
ALT: 19 U/L (ref 17–63)
AST: 24 U/L (ref 15–41)
Alkaline Phosphatase: 65 U/L (ref 38–126)
Anion gap: 10 (ref 5–15)
BUN: 23 mg/dL — AB (ref 6–20)
CHLORIDE: 101 mmol/L (ref 101–111)
CO2: 23 mmol/L (ref 22–32)
Calcium: 9 mg/dL (ref 8.9–10.3)
Creatinine, Ser: 1.1 mg/dL (ref 0.61–1.24)
GFR calc Af Amer: >60 mL/min (ref >60)
GFR calc non Af Amer: >60 mL/min (ref >60)
GLUCOSE: 225 mg/dL — AB (ref 65–99)
POTASSIUM: 4.5 mmol/L (ref 3.5–5.1)
Sodium: 134 mmol/L — ABNORMAL LOW (ref 135–145)
Total Bilirubin: 0.6 mg/dL (ref 0.3–1.2)
Total Protein: 8 g/dL (ref 6.5–8.1)

## 2017-03-28 LAB — URINALYSIS, ROUTINE W REFLEX MICROSCOPIC
GLUCOSE, UA: 100 mg/dL — AB
Hgb urine dipstick: NEGATIVE
LEUKOCYTES UA: NEGATIVE
Nitrite: NEGATIVE
PH: 6 (ref 5.0–8.0)
Protein, ur: NEGATIVE mg/dL
Specific Gravity, Urine: >1.03 — ABNORMAL HIGH (ref 1.005–1.030)

## 2017-03-28 LAB — LIPASE, BLOOD: Lipase: 31 U/L (ref 11–51)

## 2017-03-28 MED ORDER — ONDANSETRON HCL 4 MG PO TABS: 4.0000 mg | ORAL_TABLET | Freq: Three times a day (TID) | ORAL | 0 refills | Status: DC | PRN

## 2017-03-28 MED ORDER — ACETAMINOPHEN 325 MG PO TABS
650.0000 mg | ORAL_TABLET | Freq: Once | ORAL | Status: AC
  Administered 2017-03-28: 650 mg via ORAL

## 2017-03-28 MED ORDER — SODIUM CHLORIDE 0.9 % IV BOLUS (SEPSIS)
1000.0000 mL | Freq: Once | INTRAVENOUS | Status: AC
  Administered 2017-03-28: 1000 mL via INTRAVENOUS

## 2017-03-28 MED ORDER — ONDANSETRON HCL 4 MG/2ML IJ SOLN
4.0000 mg | Freq: Once | INTRAMUSCULAR | Status: AC
  Administered 2017-03-28: 4 mg via INTRAVENOUS
  Filled 2017-03-28: qty 2

## 2017-03-28 MED ORDER — IOPAMIDOL (ISOVUE-300) INJECTION 61%
100.0000 mL | Freq: Once | INTRAVENOUS | Status: AC | PRN
  Administered 2017-03-28: 100 mL via INTRAVENOUS

## 2017-03-28 MED ORDER — ACETAMINOPHEN 325 MG PO TABS
ORAL_TABLET | ORAL | Status: AC
  Filled 2017-03-28: qty 2

## 2017-03-28 NOTE — ED Notes (Signed)
Per patient, pain to LUQ comes and goes.

## 2017-03-28 NOTE — Assessment & Plan Note (Signed)
Viral syndrome, severe LUQ abdominal pain. Sxs  started 3 days ago, initially sx seem typical for viral syndrome however last night had several episodes of intense LUQ abdominal pain, they were associated with nausea, vomiting. Abdominal exam today is benign, GU ROS negative, no chest pain but has some shortness of breath. I am not sure why he is hurting on the abdomen the way he describes, I think he needs appropriate workup this morning, may also need IV fluids;  discussed the case with the ER doctor who agreed to see him. Patient is ambulatory will walk down to the ER.

## 2017-03-28 NOTE — Discharge Instructions (Signed)
Your workup today showed no evidence of obstruction, diverticulitis, or other serious intra-abdominal pathology.  We suspect you are dehydrated in the setting of your recent viral infection.  Please stay hydrated and use the nausea medicine to help maintain hydration.  Please follow-up with your primary doctor in several days.  If any symptoms change or worsen, please return to the nearest ED.

## 2017-03-28 NOTE — ED Notes (Signed)
Poor appetite since Friday.

## 2017-03-28 NOTE — ED Provider Notes (Signed)
Nathaniel [name]   CSN: [id number] Arrival date & time: 03/28/17  1209     History   Chief Complaint Chief Complaint  Patient presents with  . Abdominal Pain    HPI Nathaniel [name] is a 57 y.o. male.  The history is provided by the patient and medical records. No language interpreter was used.  Emesis   This is a new problem. The current episode started 12 to 24 hours ago. The problem occurs 5 to 10 times per day. The problem has not changed since onset.There has been no fever. Associated symptoms include abdominal pain, chills, cough, diarrhea and URI. Pertinent negatives include no fever, no headaches and no sweats.  URI   This is a new problem. The current episode started more than 2 days ago. The problem has been gradually improving. There has been no fever. Associated symptoms include abdominal pain, diarrhea, nausea, vomiting, congestion and cough. Pertinent negatives include no chest pain, no dysuria, no headaches, no rhinorrhea, no neck pain and no wheezing. He has tried nothing for the symptoms. The treatment provided no relief.    Past Medical History:  Diagnosis Date  . CAD (coronary artery disease)    a. ant stemi 04/2011 - totalled LAD treated w/ thrombectomy & 3.5 x 16 Promus DES.  OTW nonobs dzs.  EF 45%  . Chronic headaches    cluster  . Depression    has taken meds for depression before  . Diabetes mellitus    dx at age 79, not overweight was told he was type 1  . Hyperlipemia   . Ischemic cardiomyopathy    a. EF 45% by V-gram 04/2011;  b. Echo 04/19/2011 EF 55%, Gr 1 DD.  Marland Kitchen Myocardial infarction ([place])   . Prostate cancer ([place])   . Sleep apnea    has CPAP does not use  . Tobacco abuse    reports he quit after STEMI    Patient Active Problem List   Diagnosis Date Noted  . Prostate cancer ([place]) 03/11/2017  . Schatzki's ring 04/22/2016  . PCP NOTES >>>>>>>>>>>>> 02/11/2016  . Microscopic colitis 10/25/2014  .  UARS (upper airway resistance syndrome) 10/10/2014  . Hyperlipidemia LDL goal <70 10/10/2014  . Diarrhea 04/19/2014  . Change in bowel function 03/31/2014  . 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 Ischemic cardiomyopathy 04/19/2011  . Tobacco abuse 04/19/2011  . ST elevation (STEMI) myocardial infarction involving left anterior descending coronary artery ([place]) 04/17/2011  . CAD (coronary artery disease) 04/17/2011  . Annual physical exam 08/25/2010  . Insomnia 12/26/2009  . CLUSTER HEADACHE 06/02/2009  . DIABETES MELLITUS, TYPE I 10/18/2008  . HYPERLIPIDEMIA 10/18/2008  . DEPRESSION 10/18/2008    Past Surgical History:  Procedure Laterality Date  . Cardiac cath with PCI  04/17/2011   DES to LAD  . COLONOSCOPY WITH PROPOFOL N/A 05/16/2014   Procedure: COLONOSCOPY WITH PROPOFOL;  Surgeon: [name], MD;  Location: [facility] ENDOSCOPY;  Service: Endoscopy;  Laterality: N/A;  . LEFT HEART CATHETERIZATION WITH CORONARY ANGIOGRAM N/A 04/17/2011   Procedure: LEFT HEART CATHETERIZATION WITH CORONARY ANGIOGRAM;  Surgeon: Burnell Blanks, MD;  Location: [facility] CATH LAB;  Service: Cardiovascular;  Laterality: N/A;  . PERCUTANEOUS CORONARY STENT INTERVENTION (PCI-S)  04/17/2011   Procedure: PERCUTANEOUS CORONARY STENT INTERVENTION (PCI-S);  Surgeon: Burnell Blanks, MD;  Location: [facility] CATH LAB;  Service: Cardiovascular;;  . TONSILLECTOMY    . VASECTOMY         Home Medications    Prior to Admission medications   Medication Sig Start Date End Date Taking? Authorizing Provider  aspirin EC 81 MG tablet Take 81 mg by mouth every morning.     [provider]  atorvastatin (LIPITOR) 20 MG tablet Take 1 tablet (20 mg total) by mouth daily. 03/03/17   Burnell Blanks, MD  budesonide (ENTOCORT EC) 3 MG 24 hr capsule Take 3 mg by mouth daily.    [provider]  carvedilol  (COREG) 3.125 MG tablet TAKE 1 TABLET BY MOUTH 2 TIMES DAILY WITH A MEAL. 11/12/16   Burnell Blanks, MD  clopidogrel (PLAVIX) 75 MG tablet TAKE 1 TABLET (75 MG TOTAL) BY MOUTH DAILY. 01/13/17   Burnell Blanks, MD  Continuous Blood Gluc Receiver (FREESTYLE LIBRE READER) DEVI by Does not apply route. Check as directed by ENDO    [provider]  Continuous Blood Gluc Sensor (Mayer) MISC by Does not apply route. Check as directed    [provider]  GLUCAGON EMERGENCY 1 MG injection  08/07/10   [provider]  Insulin Glargine (BASAGLAR KWIKPEN) 100 UNIT/ML SOPN As directed. 05/29/15   Delrae Rend, MD  lisinopril (PRINIVIL,ZESTRIL) 2.5 MG tablet TAKE 1/2 TABLET (1.25MG ) INTHE MORNING 02/07/17   Burnell Blanks, MD  nitroGLYCERIN (NITROSTAT) 0.4 MG SL tablet Place 0.4 mg under the tongue every 5 (five) minutes as needed for chest pain.    [provider]  NOVOLOG 100 UNIT/ML injection 1.08 Units every hour. Use as directed per sliding scale (insulin pump) 06/05/14   [provider]  verapamil (CALAN) 80 MG tablet Take 80 mg by mouth as needed.    [provider]    Family History Family History  Problem Relation Age of Onset  . Multiple sclerosis Mother   . Parkinsonism Father   . Stroke Father   . Coronary artery disease Neg Hx   . Hypertension Neg Hx   . Diabetes Neg Hx   . Colon cancer Neg Hx   . Prostate cancer Neg Hx   . Stomach cancer Neg Hx     Social History Social History   Tobacco Use  . Smoking status: Former Smoker    Packs/day: 0.50    Years: 15.00    Pack years: 7.50    Types: Cigarettes    Last attempt to quit: 04/17/2011    Years since quitting: 5.9  . Smokeless tobacco: Never Used  Substance Use Topics  . Alcohol use: Yes    Alcohol/week: 3.0 - 3.6 oz    Types: 5 - 6 Glasses of wine per week    Comment: 2 glasses of wine most nights  . Drug use: No      Allergies   Ciprofloxacin   Review of Systems Review of Systems  Constitutional: Positive for chills and fatigue. Negative for diaphoresis and fever.  HENT: Positive for congestion. Negative for rhinorrhea.   Eyes: Negative for visual disturbance.  Respiratory: Positive for cough. Negative for chest tightness, shortness of breath, wheezing and stridor.   Cardiovascular: Negative for chest pain and palpitations.  Gastrointestinal: Positive for abdominal distention, abdominal pain, diarrhea, nausea and vomiting. Negative for constipation.  Genitourinary: Negative for dysuria, flank pain and frequency.  Musculoskeletal: Negative for back pain, neck pain and neck stiffness.  Neurological: Negative for light-headedness, numbness and headaches.  Psychiatric/Behavioral: Negative for agitation.  All other systems reviewed and are negative.    Physical Exam Updated Vital Signs BP (!) 113/96 (BP Location: Left Arm)   Pulse 83   Temp 98.7 F (37.1 C) (Oral)   Resp 18   Ht 5\' 8"  (1.727 m)   Wt 84.4 kg (186 lb)   SpO2 100%   BMI 28.28 kg/m   Physical Exam  Constitutional: He is oriented to person, place, and time. He appears well-developed and well-nourished.  Non-toxic appearance. He does not appear ill. No distress.  HENT:  Head: Normocephalic.  Mouth/Throat: Oropharynx is clear and moist. No oropharyngeal exudate.  Eyes: Conjunctivae and EOM are normal. Pupils are equal, round, and reactive to light.  Neck: Normal range of motion.  Cardiovascular: Normal rate and intact distal pulses.  No murmur heard. Pulmonary/Chest: Effort normal and breath sounds normal. No stridor. No respiratory distress. He has no wheezes. He exhibits no tenderness.  Abdominal: Soft. Normal appearance and bowel sounds are normal. He exhibits no distension. There is tenderness in the epigastric area and left upper quadrant. There is no rigidity, no rebound, no guarding and no CVA tenderness.     Musculoskeletal: He exhibits no edema or tenderness.  Neurological: He is alert and oriented to person, place, and time. No sensory deficit.  Skin: Capillary refill takes less than 2 seconds. He is not diaphoretic. No erythema. No pallor.  Psychiatric: He has a normal mood and affect.  Nursing [name] and vitals reviewed.    ED Treatments / Results  Labs (all labs ordered are listed, but only abnormal results are displayed) Labs Reviewed  COMPREHENSIVE METABOLIC PANEL - Abnormal; Notable for the following components:      Result Value   Sodium 134 (*)    Glucose, Bld 225 (*)    BUN 23 (*)    All other components within normal limits  CBC WITH DIFFERENTIAL/PLATELET - Abnormal; Notable for the following components:   Lymphs Abs 0.6 (*)    All other components within normal limits  URINALYSIS, ROUTINE W REFLEX MICROSCOPIC - Abnormal; Notable for the following components:   Specific Gravity, Urine >1.030 (*)    Glucose, UA 100 (*)    Bilirubin Urine SMALL (*)    Ketones, ur >80 (*)    All other components within normal limits  URINE CULTURE  LIPASE, BLOOD    EKG  EKG Interpretation None       Radiology Dg Chest 2 View  Result Date: 03/28/2017 CLINICAL DATA:  Chest pain, cough, shortness of breath EXAM: CHEST  2 VIEW COMPARISON:  02/11/2016 FINDINGS: Lungs are clear.  No pleural effusion or pneumothorax. The heart is normal in size. Visualized osseous structures are within normal limits. IMPRESSION: Normal chest radiographs. Electronically Signed   By: Julian Hy M.D.   On: 03/28/2017 14:50   Ct Abdomen Pelvis W Contrast  Result Date: 03/28/2017 CLINICAL DATA:  57 year old male with acute abdominal and pelvic pain, nausea and vomiting for 1 day. EXAM: CT ABDOMEN AND PELVIS WITH CONTRAST TECHNIQUE: Multidetector CT imaging of the abdomen and pelvis was performed using the standard protocol following bolus administration of intravenous contrast. CONTRAST:  162mL  ISOVUE-300 IOPAMIDOL (ISOVUE-300) INJECTION 61% COMPARISON:  03/21/2017 CT FINDINGS: Lower chest: No acute abnormality Hepatobiliary: The liver and gallbladder are unremarkable. No biliary dilatation. Pancreas: Unremarkable Spleen: Unremarkable Adrenals/Urinary Tract: The kidneys, adrenal glands and bladder are unremarkable. Stomach/Bowel: Stomach is within normal limits. Appendix appears normal. No evidence of bowel  wall thickening, distention, or inflammatory changes. Vascular/Lymphatic: Aortic atherosclerosis. No enlarged abdominal or pelvic lymph nodes. Reproductive: Mild prostate enlargement noted. Other: Small-moderate bilateral inguinal hernias containing fat noted. No ascites, abscess or pneumoperitoneum. Musculoskeletal: No acute or suspicious bony abnormalities identified. Degenerative disc disease at L2-3 and L5-S1 again noted. IMPRESSION: 1. No evidence of acute abnormality 2. Small to moderate bilateral inguinal hernias containing fat again noted. 3. Mild prostate enlargement 4.  Aortic Atherosclerosis (ICD10-I70.0). Electronically Signed   By: Margarette Canada M.D.   On: 03/28/2017 16:02    Procedures Procedures (including critical care time)  Medications Ordered in ED Medications  acetaminophen (TYLENOL) tablet 650 mg (650 mg Oral Given 03/28/17 1344)  sodium chloride 0.9 % bolus 1,000 mL (0 mLs Intravenous Stopped 03/28/17 1716)  sodium chloride 0.9 % bolus 1,000 mL (0 mLs Intravenous Stopped 03/28/17 1632)  ondansetron (ZOFRAN) injection 4 mg (4 mg Intravenous Given 03/28/17 1529)  iopamidol (ISOVUE-300) 61 % injection 100 mL (100 mLs Intravenous Contrast Given 03/28/17 1543)     Initial Impression / Assessment and Plan / ED Course  I have reviewed the triage vital signs and the nursing notes.  Pertinent labs & imaging results that were available during my care of the patient were reviewed by me and considered in my medical decision making (see chart for details).     CARLYLE MCELRATH  is a 57 y.o. male with a past medical history significant for CAD with prior PCI and MI, type I diabetes, reported prior colitis and hyperlipidemia who presents from PCP office for further workup of abdominal pain, nausea, vomiting, decreased flatus, decreased oral intake, abdominal distention, and URI symptoms.  Patient reports that he went to see his PCP today after several days of URI symptoms but today developed nausea, vomiting, abdominal discomfort.  He reports the pain is a 7 out of 10 in severity and cramping.  He reports it is in his central and upper left side of his abdomen.  He denies any history of the symptoms.  He does report he had colitis before but has never had bowel obstruction.  He reports he has not passed gas today and is not had a bowel movement.  He denies any blood in his emesis but reports he has not been able to tolerate p.o.  He was sent down for rehydration and consideration of CT scan given the abdominal discomfort and symptoms.  He reports that his URI symptoms have improved over the last.  On exam, patient had some abdominal tenderness.  No guarding or rebound tenderness.  No CVA tenderness.  Lungs were clear and chest was nontender.  Given the tenderness and concern for decreased flatus, decision made to obtain CT scan to rule out bowel obstruction.  Laboratory testing also collected and patient was given fluids and nausea medicine.  Patient reported feeling much better after nausea medicine and fluids.  CT scan showed no evidence of obstruction or other acute intra-abdominal pathology.  Bilateral fat-containing inguinal hernias were found the patient had no tenderness in these locations.  Chest x-ray shows no pneumonia.  Laboratory testing showed ketones likely reflective of dehydration as patient does not appear to be in DKA.  Given patient's improvement in symptoms with the nausea medicine and fluids, patient was reassessed.  Patient had passage of flatus and felt much  better in his abdomen.  Suspect gas was the primary cause of pain.  Patient was given prescription for nausea medicine and instructed to follow-up with PCP  in several days.  Patient was given strict return precautions for any new or worsened symptoms.  Patient had no other questions or concerns and was discharged in good condition.     Final Clinical Impressions(s) / ED Diagnoses   Final diagnoses:  Generalized abdominal pain  Nausea and vomiting, intractability of vomiting not specified, unspecified vomiting type  Dehydration  Upper respiratory tract infection, unspecified type    ED Discharge Orders        Ordered    ondansetron (ZOFRAN) 4 MG tablet  Every 8 hours PRN     03/28/17 1801     Clinical Impression: 1. Generalized abdominal pain   2. Nausea and vomiting, intractability of vomiting not specified, unspecified vomiting type   3. Dehydration   4. Upper respiratory tract infection, unspecified type     Disposition: Discharge  Condition: Good  I have discussed the results, Dx and Tx plan with the pt(& family if present). He/she/they expressed understanding and agree(s) with the plan. Discharge instructions discussed at great length. Strict return precautions discussed and pt &/or family have verbalized understanding of the instructions. No further questions at time of discharge.    Discharge Medication List as of 03/28/2017  6:12 PM    START taking these medications   Details  ondansetron (ZOFRAN) 4 MG tablet Take 1 tablet (4 mg total) by mouth every 8 (eight) hours as needed., Starting Mon 03/28/2017, Print        Follow Up: [facility], [address] [place] [address] [phone]     [facility] EMERGENCY DEPARTMENT [address] [id number] [place] [address] [phone]       Tegeler, Gwenyth Allegra, MD 03/29/17 [phone]

## 2017-03-28 NOTE — Patient Instructions (Signed)
Please go to the ER downstairs.  They have acces to  all your records.

## 2017-03-28 NOTE — ED Triage Notes (Signed)
abd pain, n/v started last night-flu like sx started last week-pt set from PCP-NAD-steady gait

## 2017-03-28 NOTE — Progress Notes (Signed)
Subjective:    Patient ID: Nathaniel [name], male    DOB: 1960-08-27, 57 y.o.   MRN: [id number]  DOS:  03/28/2017 Type of visit - description : acute Interval history: Symptoms of started 3 days ago with cough, chest congestion, mild shortness of breath. The next day he developed a headache and nasal congestion, clear nasal discharge. Last night, he had few episodes of LUQ abdominal pain, reportedly intense, no radiation, but had nausea and vomiting twice at around 5 AM and 10 AM today. People at work has been sick with a virus.   Review of Systems Although he reports some difficulty breathing, denies chest pain. No fever, some chills No sore throat Last BM today, normal.  No diarrhea or blood in the stools. + Generalized myalgias. P.o. intake somewhat decreased, he feels weak, CBGs are in the high 100s. No rash Had a headache 2 days ago, not today.  Past Medical History:  Diagnosis Date  . CAD (coronary artery disease)    a. ant stemi 04/2011 - totalled LAD treated w/ thrombectomy & 3.5 x 16 Promus DES.  OTW nonobs dzs.  EF 45%  . Chronic headaches    cluster  . Depression    has taken meds for depression before  . Diabetes mellitus    dx at age 56, not overweight was told he was type 1  . Hyperlipemia   . Ischemic cardiomyopathy    a. EF 45% by V-gram 04/2011;  b. Echo 04/19/2011 EF 55%, Gr 1 DD.  Marland Kitchen Myocardial infarction ([place])   . Prostate cancer ([place])   . Sleep apnea    has CPAP does not use  . Tobacco abuse    reports he quit after STEMI    Past Surgical History:  Procedure Laterality Date  . Cardiac cath with PCI  04/17/2011   DES to LAD  . COLONOSCOPY WITH PROPOFOL N/A 05/16/2014   Procedure: COLONOSCOPY WITH PROPOFOL;  Surgeon: [name], MD;  Location: [facility] ENDOSCOPY;  Service: Endoscopy;  Laterality: N/A;  . LEFT HEART CATHETERIZATION WITH CORONARY ANGIOGRAM N/A 04/17/2011   Procedure: LEFT HEART CATHETERIZATION WITH CORONARY ANGIOGRAM;  Surgeon: Burnell Blanks, MD;  Location: [facility] CATH LAB;  Service: Cardiovascular;  Laterality: N/A;  . PERCUTANEOUS CORONARY STENT INTERVENTION (PCI-S)  04/17/2011   Procedure: PERCUTANEOUS CORONARY STENT INTERVENTION (PCI-S);  Surgeon: Burnell Blanks, MD;  Location: [facility] CATH LAB;  Service: Cardiovascular;;  . TONSILLECTOMY    . VASECTOMY      Social History   Socioeconomic History  . Marital status: Married    Spouse name: Not on file  . Number of children: 1  . Years of education: Not on file  . Highest education level: Not on file  Social Needs  . Financial resource strain: Not on file  . Food insecurity - worry: Not on file  . Food insecurity - inability: Not on file  . Transportation needs - medical: Not on file  . Transportation needs - non-medical: Not on file  Occupational History  . Occupation: PRICE ANALYST    Employer: [employer]    Comment: [employer]  Tobacco Use  . Smoking status: Former Smoker    Packs/day: 0.50    Years: 15.00    Pack years: 7.50    Types: Cigarettes    Last attempt to quit: 04/17/2011    Years since quitting: 5.9  . Smokeless tobacco: Never Used  Substance and Sexual Activity  . Alcohol use:  Yes    Alcohol/week: 3.0 - 3.6 oz    Types: 5 - 6 Glasses of wine per week    Comment: 2 glasses of wine most nights  . Drug use: No  . Sexual activity: Not Currently  Other Topics Concern  . Not on file  Social History Narrative   Moved from [place] on July 2010    Household- pt and wife      Allergies as of 03/28/2017   No Known Allergies     Medication List        Accurate as of 03/28/17 11:31 AM. Always use your most recent med list.          aspirin EC 81 MG tablet Take 81 mg by mouth every morning.   atorvastatin 20 MG tablet Commonly known as:  LIPITOR Take 1 tablet (20 mg total) by mouth daily.   BASAGLAR KWIKPEN 100 UNIT/ML Sopn As directed.   budesonide 3 MG 24 hr capsule Commonly known as:  ENTOCORT EC Take 3 mg by  mouth daily.   carvedilol 3.125 MG tablet Commonly known as:  COREG TAKE 1 TABLET BY MOUTH 2 TIMES DAILY WITH A MEAL.   clopidogrel 75 MG tablet Commonly known as:  PLAVIX TAKE 1 TABLET (75 MG TOTAL) BY MOUTH DAILY.   FREESTYLE LIBRE READER Devi by Does not apply route. Check as directed by [place] by Does not apply route. Check as directed   GLUCAGON EMERGENCY 1 MG injection Generic drug:  glucagon   lisinopril 2.5 MG tablet Commonly known as:  PRINIVIL,ZESTRIL TAKE 1/2 TABLET (1.25MG ) INTHE MORNING   nitroGLYCERIN 0.4 MG SL tablet Commonly known as:  NITROSTAT Place 0.4 mg under the tongue every 5 (five) minutes as needed for chest pain.   NOVOLOG 100 UNIT/ML injection Generic drug:  insulin aspart 1.08 Units every hour. Use as directed per sliding scale (insulin pump)   verapamil 80 MG tablet Commonly known as:  CALAN Take 80 mg by mouth as needed.          Objective:   Physical Exam BP 116/74 (BP Location: Left Arm, Patient Position: Sitting, Cuff Size: Normal)   Pulse 79   Temp 97.9 F (36.6 C) (Oral)   Resp 14   Ht 5\' 8"  (1.727 m)   Wt 186 lb 8 oz (84.6 kg)   SpO2 96%   BMI 28.36 kg/m  General:   Well developed, well nourished looks tired, nontoxic appearing.  HEENT:  Normocephalic . Face symmetric, atraumatic.  Not pale Lungs:  CTA B Normal respiratory effort, no intercostal retractions, no accessory muscle use. Heart: RRR,  no murmur.  no pretibial edema bilaterally  Abdomen:  Not distended, soft, non-tender. No rebound or rigidity.   Skin: Not pale. Not jaundice Neurologic:  alert & oriented X3.  Speech normal, gait appropriate for age , unassisted Psych--  Cognition and judgment appear intact.  Cooperative with normal attention span and concentration.  Behavior appropriate. No anxious or depressed appearing.     Assessment & Plan:   Assessment DM--endocrinology, Dr Buddy Duty Hyperlipidemia CAD, sees  cards  "STEMI March 2013 : occlusion of the ostial LAD. There was mild plaque in the circumflex and RCA. A Promus DES was placed in the ostial LAD. 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 Sxs  started 3 days ago, initially sx seem typical for viral syndrome however last night had several episodes of intense LUQ abdominal pain, they were associated with nausea, vomiting. Abdominal exam today is benign, GU ROS negative, no chest pain but has some shortness of breath. I am not sure why he is hurting on the abdomen the way he describes, I think he needs appropriate workup this morning, may also need IV fluids;  discussed the case with the ER doctor who agreed to see him. Patient is ambulatory will walk down to the ER.

## 2017-03-28 NOTE — Progress Notes (Signed)
Pre visit review using our clinic review tool, if applicable. No additional management support is needed unless otherwise documented below in the visit note. 

## 2017-03-30 LAB — URINE CULTURE: Culture: NO GROWTH

## 2017-04-06 ENCOUNTER — Telehealth: Payer: Self-pay | Admitting: Internal Medicine

## 2017-04-06 NOTE — Telephone Encounter (Signed)
LMOM, patient went to the ER several days ago, I asked him to call me if he is not better, might need further eval or  see the GI doctor.

## 2017-05-09 HISTORY — PX: PROSTATECTOMY: SHX69

## 2017-06-07 ENCOUNTER — Telehealth: Payer: Self-pay | Admitting: Cardiovascular Disease

## 2017-06-07 NOTE — Telephone Encounter (Signed)
Pt c/o medication issue:  1. Name of Medication: nitroGLYCERIN (NITROSTAT) 0.4 MG SL tablet    2. How are you currently taking this medication (dosage and times per day)? Place 0.4 mg under the tongue every 5 (five) minutes as needed for chest pain.  3. Are you having a reaction (difficulty breathing--STAT)?  no  4. What is your medication issue? Conflict with his Viagra 20mg  2 pills 3x week and then 4 to 5 before intercourse/ pt has a long recovery time before he goes off of the medication

## 2017-06-07 NOTE — Telephone Encounter (Signed)
Pt with recent surgery for Prostate cancer.  Per Pt part of his rehabilitation after this surgery is using sildanefil.  Pt has nitroglycerin on his med list-states he has not taken it in years.    [facility] requiring approval from Pt's cardiologist prior to starting sildanefil therapy d/t Pt with nitroglycerin on his med list.  Advised Pt to have [facility] request medical clearance from [facility].  Gave fax #.  Pt will have [facility] send request.

## 2017-06-23 LAB — MICROALBUMIN, URINE: Microalb, Ur: <0.7

## 2017-06-23 LAB — LIPID PANEL
Cholesterol: 146 (ref 0–200)
HDL: 60 (ref 35–70)
LDL Cholesterol: 76
Triglycerides: 47 (ref 40–160)

## 2017-06-23 LAB — HEPATIC FUNCTION PANEL
ALT: 12 (ref 10–40)
AST: 13 — AB (ref 14–40)
Alkaline Phosphatase: 69 (ref 25–125)
BILIRUBIN, TOTAL: 0.4

## 2017-06-23 LAB — BASIC METABOLIC PANEL
BUN: 20 (ref 4–21)
CREATININE: 1 (ref 0.6–1.3)
Glucose: 116
POTASSIUM: 5 (ref 3.4–5.3)
SODIUM: 139 (ref 137–147)

## 2017-06-23 LAB — TSH: TSH: 1.11 (ref 0.41–5.90)

## 2017-06-23 LAB — HEMOGLOBIN A1C: HEMOGLOBIN A1C: 5.2 (ref 4.0–6.0)

## 2017-07-09 ENCOUNTER — Emergency Department (HOSPITAL_COMMUNITY)
Admission: EM | Admit: 2017-07-09 | Discharge: 2017-07-10 | Disposition: A | Discharge status: Home / Self Care | Payer: No Typology Code available for payment source | Source: Home / Self Care | Attending: Emergency Medicine | Admitting: Emergency Medicine

## 2017-07-09 ENCOUNTER — Encounter (HOSPITAL_COMMUNITY): Payer: Self-pay

## 2017-07-09 ENCOUNTER — Emergency Department (HOSPITAL_COMMUNITY)
Admission: EM | Admit: 2017-07-09 | Discharge: 2017-07-09 | Disposition: A | Discharge status: Home / Self Care | Payer: No Typology Code available for payment source | Attending: Emergency Medicine | Admitting: Emergency Medicine

## 2017-07-09 ENCOUNTER — Other Ambulatory Visit: Payer: Self-pay

## 2017-07-09 ENCOUNTER — Encounter (HOSPITAL_COMMUNITY): Payer: Self-pay | Admitting: Emergency Medicine

## 2017-07-09 DIAGNOSIS — Z8546 Personal history of malignant neoplasm of prostate: Secondary | ICD-10-CM | POA: Insufficient documentation

## 2017-07-09 DIAGNOSIS — R339 Retention of urine, unspecified: Secondary | ICD-10-CM | POA: Insufficient documentation

## 2017-07-09 DIAGNOSIS — Z5321 Procedure and treatment not carried out due to patient leaving prior to being seen by health care provider: Secondary | ICD-10-CM | POA: Diagnosis not present

## 2017-07-09 DIAGNOSIS — R300 Dysuria: Secondary | ICD-10-CM | POA: Insufficient documentation

## 2017-07-09 DIAGNOSIS — E109 Type 1 diabetes mellitus without complications: Secondary | ICD-10-CM | POA: Insufficient documentation

## 2017-07-09 DIAGNOSIS — I252 Old myocardial infarction: Secondary | ICD-10-CM | POA: Insufficient documentation

## 2017-07-09 DIAGNOSIS — I2510 Atherosclerotic heart disease of native coronary artery without angina pectoris: Secondary | ICD-10-CM | POA: Insufficient documentation

## 2017-07-09 LAB — URINALYSIS, ROUTINE W REFLEX MICROSCOPIC
BILIRUBIN URINE: NEGATIVE
Bacteria, UA: NONE SEEN
Glucose, UA: NEGATIVE mg/dL
HGB URINE DIPSTICK: NEGATIVE
KETONES UR: NEGATIVE mg/dL
NITRITE: NEGATIVE
PH: 6 (ref 5.0–8.0)
Protein, ur: NEGATIVE mg/dL
Specific Gravity, Urine: 1.012 (ref 1.005–1.030)

## 2017-07-09 LAB — I-STAT CHEM 8, ED
BUN: 17 mg/dL (ref 6–20)
CALCIUM ION: 1.23 mmol/L (ref 1.15–1.40)
CREATININE: 1 mg/dL (ref 0.61–1.24)
Chloride: 102 mmol/L (ref 101–111)
GLUCOSE: 155 mg/dL — AB (ref 65–99)
HEMATOCRIT: 38 % — AB (ref 39.0–52.0)
Hemoglobin: 12.9 g/dL — ABNORMAL LOW (ref 13.0–17.0)
Potassium: 4.2 mmol/L (ref 3.5–5.1)
Sodium: 138 mmol/L (ref 135–145)
TCO2: 24 mmol/L (ref 22–32)

## 2017-07-09 MED ORDER — FOSFOMYCIN TROMETHAMINE 3 G PO PACK
3.0000 g | PACK | Freq: Once | ORAL | Status: AC
  Administered 2017-07-10: 3 g via ORAL
  Filled 2017-07-09: qty 3

## 2017-07-09 MED ORDER — OXYBUTYNIN CHLORIDE ER 5 MG PO TB24: 5.0000 mg | ORAL_TABLET | Freq: Every day | ORAL | 0 refills | Status: DC

## 2017-07-09 NOTE — ED Notes (Signed)
ED Provider at bedside. 

## 2017-07-09 NOTE — ED Triage Notes (Addendum)
Pt complains of urinary retention, hx of prostatectomy in April.  Left [place] [facility] prior to being seen this morning.  Pt states his urologist told him to come ED if he could not urinate.  Was able to urinate "a little" earlier this afternoon

## 2017-07-09 NOTE — ED Notes (Signed)
Pt called for recheck v/s, no response from lobby 

## 2017-07-09 NOTE — ED Notes (Signed)
145ml bladder scan

## 2017-07-09 NOTE — ED Triage Notes (Signed)
He c/o inability to void this afternoon. He cites prostatectomy in Early April of this year with an uneventful post-op course. He is wearing a glucose meter and per his request we get him some orange juice. His bladder scan amount is 77ml.

## 2017-07-09 NOTE — ED Provider Notes (Signed)
[facility] DEPART[facility] Provider Note   CSN: [id number] Arrival date & time: 07/09/17  1956     History   Chief Complaint Chief Complaint  Patient presents with  . Urinary Retention    HPI DONTAI [name] is a 57 y.o. male.  57 yo M with a chief complaint of dysuria increased frequency and hesitancy.  This been going on for the past couple days and slowly worsening.  Patient felt like he was having trouble urinating today and so came to the emergency department.  He had a prostatectomy a couple months ago.  Denies fevers or chills denies flank pain.  The history is provided by the patient.  Illness  This is a new problem. The current episode started 2 days ago. The problem occurs constantly. The problem has not changed since onset.Pertinent negatives include no chest pain, no abdominal pain, no headaches and no shortness of breath. Nothing aggravates the symptoms. Nothing relieves the symptoms. He has tried nothing for the symptoms.    Past Medical History:  Diagnosis Date  . CAD (coronary artery disease)    a. ant stemi 04/2011 - totalled LAD treated w/ thrombectomy & 3.5 x 16 Promus DES.  OTW nonobs dzs.  EF 45%  . Chronic headaches    cluster  . Depression    has taken meds for depression before  . Diabetes mellitus    dx at age 13, not overweight was told he was type 1  . Hyperlipemia   . Ischemic cardiomyopathy    a. EF 45% by V-gram 04/2011;  b. Echo 04/19/2011 EF 55%, Gr 1 DD.  Marland Kitchen Myocardial infarction ([place])   . Prostate cancer ([place])   . Sleep apnea    has CPAP does not use  . Tobacco abuse    reports he quit after STEMI    Patient Active Problem List   Diagnosis Date Noted  . Prostate cancer ([place]) 03/11/2017  . Schatzki's ring 04/22/2016  . PCP NOTES >>>>>>>>>>>>> 02/11/2016  . Microscopic colitis 10/25/2014  . UARS (upper airway resistance syndrome) 10/10/2014  . Hyperlipidemia LDL goal <70 10/10/2014  . Diarrhea 04/19/2014  .  Change in bowel function 03/31/2014  . 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 Ischemic cardiomyopathy 04/19/2011  . Tobacco abuse 04/19/2011  . ST elevation (STEMI) myocardial infarction involving left anterior descending coronary artery ([place]) 04/17/2011  . CAD (coronary artery disease) 04/17/2011  . Annual physical exam 08/25/2010  . Insomnia 12/26/2009  . CLUSTER HEADACHE 06/02/2009  . DIABETES MELLITUS, TYPE I 10/18/2008  . HYPERLIPIDEMIA 10/18/2008  . DEPRESSION 10/18/2008    Past Surgical History:  Procedure Laterality Date  . Cardiac cath with PCI  04/17/2011   DES to LAD  . COLONOSCOPY WITH PROPOFOL N/A 05/16/2014   Procedure: COLONOSCOPY WITH PROPOFOL;  Surgeon: [name], MD;  Location: [facility] ENDOSCOPY;  Service: Endoscopy;  Laterality: N/A;  . LEFT HEART CATHETERIZATION WITH CORONARY ANGIOGRAM N/A 04/17/2011   Procedure: LEFT HEART CATHETERIZATION WITH CORONARY ANGIOGRAM;  Surgeon: Burnell Blanks, MD;  Location: [facility] CATH LAB;  Service: Cardiovascular;  Laterality: N/A;  . PERCUTANEOUS CORONARY STENT INTERVENTION (PCI-S)  04/17/2011   Procedure: PERCUTANEOUS CORONARY STENT INTERVENTION (PCI-S);  Surgeon: Burnell Blanks, MD;  Location: [facility] CATH LAB;  Service: Cardiovascular;;  . PROSTATECTOMY    . TONSILLECTOMY    . VASECTOMY  Home Medications    Prior to Admission medications   Medication Sig Start Date End Date Taking? Authorizing Provider  aspirin EC 81 MG tablet Take 81 mg by mouth every morning.     [provider]  atorvastatin (LIPITOR) 20 MG tablet Take 1 tablet (20 mg total) by mouth daily. 03/03/17   Burnell Blanks, MD  budesonide (ENTOCORT EC) 3 MG 24 hr capsule Take 3 mg by mouth daily.    [provider]  carvedilol (COREG) 3.125 MG tablet TAKE 1 TABLET BY MOUTH 2 TIMES DAILY WITH A MEAL. 11/12/16   Burnell Blanks, MD  clopidogrel (PLAVIX) 75 MG tablet TAKE 1 TABLET (75 MG TOTAL) BY MOUTH DAILY. 01/13/17   Burnell Blanks, MD  Continuous Blood Gluc Receiver (FREESTYLE LIBRE READER) DEVI by Does not apply route. Check as directed by ENDO    [provider]  Continuous Blood Gluc Sensor ([place]) MISC by Does not apply route. Check as directed    [provider]  GLUCAGON EMERGENCY 1 MG injection  08/07/10   [provider]  Insulin Glargine (BASAGLAR KWIKPEN) 100 UNIT/ML SOPN As directed. 05/29/15   Delrae Rend, MD  lisinopril (PRINIVIL,ZESTRIL) 2.5 MG tablet TAKE 1/2 TABLET (1.25MG ) INTHE MORNING 02/07/17   Burnell Blanks, MD  nitroGLYCERIN (NITROSTAT) 0.4 MG SL tablet Place 0.4 mg under the tongue every 5 (five) minutes as needed for chest pain.    [provider]  NOVOLOG 100 UNIT/ML injection 1.08 Units every hour. Use as directed per sliding scale (insulin pump) 06/05/14   [provider]  ondansetron (ZOFRAN) 4 MG tablet Take 1 tablet (4 mg total) by mouth every 8 (eight) hours as needed. 03/28/17   Tegeler, Gwenyth Allegra, MD  oxybutynin (DITROPAN-XL) 5 MG 24 hr tablet Take 1 tablet (5 mg total) by mouth at bedtime. 07/09/17   Deno Etienne, DO  verapamil (CALAN) 80 MG tablet Take 80 mg by mouth as needed.    [provider]    Family History Family History  Problem Relation Age of Onset  . Multiple sclerosis Mother   . Parkinsonism Father   . Stroke Father   . Coronary artery disease Neg Hx   . Hypertension Neg Hx   . Diabetes Neg Hx   . Colon cancer Neg Hx   . Prostate cancer Neg Hx   . Stomach cancer Neg Hx     Social History Social History   Tobacco Use  . Smoking status: Former Smoker    Packs/day: 0.50    Years: 15.00    Pack years: 7.50    Types: Cigarettes    Last attempt to quit: 04/17/2011    Years since quitting: 6.2  . Smokeless tobacco: Never Used  Substance Use Topics  .  Alcohol use: Yes    Alcohol/week: 3.0 - 3.6 oz    Types: 5 - 6 Glasses of wine per week    Comment: 2 glasses of wine most nights  . Drug use: No     Allergies   Ciprofloxacin   Review of Systems Review of Systems  Constitutional: Negative for chills and fever.  HENT: Negative for congestion and facial swelling.   Eyes: Negative for discharge and visual disturbance.  Respiratory: Negative for shortness of breath.   Cardiovascular: Negative for chest pain and palpitations.  Gastrointestinal: Negative for abdominal pain, diarrhea and vomiting.  Genitourinary: Positive for difficulty urinating, dysuria and frequency.  Musculoskeletal: Negative for  arthralgias and myalgias.  Skin: Negative for color change and rash.  Neurological: Negative for tremors, syncope and headaches.  Psychiatric/Behavioral: Negative for confusion and dysphoric mood.     Physical Exam Updated Vital Signs BP (!) 130/95 (BP Location: Right Arm)   Pulse (!) 55   Temp 97.9 F (36.6 C) (Oral)   Resp 16   Ht 5\' 8"  (1.727 m)   Wt 83 kg (183 lb)   SpO2 98%   BMI 27.83 kg/m   Physical Exam  Constitutional: He is oriented to person, place, and time. He appears well-developed and well-nourished.  HENT:  Head: Normocephalic and atraumatic.  Eyes: Pupils are equal, round, and reactive to light. EOM are normal.  Neck: Normal range of motion. Neck supple. No JVD present.  Cardiovascular: Normal rate and regular rhythm. Exam reveals no gallop and no friction rub.  No murmur heard. Pulmonary/Chest: No respiratory distress. He has no wheezes.  Abdominal: He exhibits no distension and no mass. There is no tenderness. There is no rebound and no guarding.  Midline lower abdominal scar with some areas of granulation tissue  Musculoskeletal: Normal range of motion.  Neurological: He is alert and oriented to person, place, and time.  Skin: No rash noted. No pallor.  Psychiatric: He has a normal mood and affect. His  behavior is normal.  Nursing note and vitals reviewed.    ED Treatments / Results  Labs (all labs ordered are listed, but only abnormal results are displayed) Labs Reviewed  URINALYSIS, ROUTINE W REFLEX MICROSCOPIC - Abnormal; Notable for the following components:      Result Value   Leukocytes, UA TRACE (*)    All other components within normal limits  I-STAT CHEM 8, ED - Abnormal; Notable for the following components:   Glucose, Bld 155 (*)    Hemoglobin 12.9 (*)    HCT 38.0 (*)    All other components within normal limits  URINE CULTURE    EKG None  Radiology No results found.  Procedures Procedures (including critical care time)  Medications Ordered in ED Medications  fosfomycin (MONUROL) packet 3 g (has no administration in time range)     Initial Impression / Assessment and Plan / ED Course  I have reviewed the triage vital signs and the nursing notes.  Pertinent labs & imaging results that were available during my care of the patient were reviewed by me and considered in my medical decision making (see chart for details).     57 yo M with a significant past medical history of prostate cancer status post prostatectomy with a chief complaint of difficulty urinating.  Postvoid residual 178.  Seems unlikely that he is retaining, question urinary tract infection versus bladder spasms will obtain a UA.  Patient feels much better on reassessment.  Felt he was able to fully empty his bladder.  Creatinine is not significantly elevated.  With the patient's symptoms I will treat presumptively for UTI with fosfomycin even though the urine here looks well.  Sent for culture. Will do trial of antispasmotics.   11:38 PM:  I have discussed the diagnosis/risks/treatment options with the patient and family and believe the pt to be eligible for discharge home to follow-up with PCP. We also discussed returning to the ED immediately if new or worsening sx occur. We discussed the sx  which are most concerning (e.g., sudden worsening pain, fever, inability to tolerate by mouth) that necessitate immediate return. Medications administered to the patient during their visit  and any new prescriptions provided to the patient are listed below.  Medications given during this visit Medications  fosfomycin (MONUROL) packet 3 g (has no administration in time range)    The patient appears reasonably screen and/or stabilized for discharge and I doubt any other medical condition or other [facility] requiring further screening, evaluation, or treatment in the ED at this time prior to discharge.    Final Clinical Impressions(s) / ED Diagnoses   Final diagnoses:  Urinary retention  Dysuria    ED Discharge Orders        Ordered    oxybutynin (DITROPAN-XL) 5 MG 24 hr tablet  Daily at bedtime     07/09/17 2331       Deno Etienne, DO 07/09/17 2338

## 2017-07-11 ENCOUNTER — Telehealth: Payer: Self-pay | Admitting: Internal Medicine

## 2017-07-11 LAB — URINE CULTURE

## 2017-07-11 NOTE — Telephone Encounter (Signed)
Please call the patient, was seen at the ER with urinary retention, is he doing better?  Do we need to see him?

## 2017-07-11 NOTE — Telephone Encounter (Signed)
Called patient left detailed message regarding return call.

## 2017-09-07 ENCOUNTER — Other Ambulatory Visit: Payer: Self-pay | Admitting: Cardiovascular Disease

## 2017-09-22 ENCOUNTER — Encounter: Payer: Self-pay | Admitting: Cardiovascular Disease

## 2017-10-13 NOTE — Progress Notes (Signed)
Chief Complaint  Patient presents with  . Follow-up    CAD   History of Present Illness: 57 yo male with history of DM, HTN, OSA, tobacco abuse, OSA and CAD with anterior STEMI March 2013 who is here today for cardiac follow up. He was admitted March 2013 with an anterior STEMI and was found to have occlusion of the ostial LAD. There was mild plaque in the circumflex and RCA. A Promus DES was placed in the ostial LAD. Repeat cardiac cath in October 2013 in the setting of recurrent chest pain showed a patent stent in the LAD, mild plaque in the LAD beyond the stent. Echo October 2018 with LVEF=50-55% with distal lateral and apical hypokinesis. No valve disease. He had issues with urinary retention when I saw him last in September 2018. He was seen in urology and found to have prostate cancer.   He is here today for follow up. The patient denies any chest pain, dyspnea, palpitations, lower extremity edema, orthopnea, PND, dizziness, near syncope or syncope.    Primary Care Physician: Colon Branch, MD  Past Medical History:  Diagnosis Date  . CAD (coronary artery disease)    a. ant stemi 04/2011 - totalled LAD treated w/ thrombectomy & 3.5 x 16 Promus DES.  OTW nonobs dzs.  EF 45%  . Chronic headaches    cluster  . Depression    has taken meds for depression before  . Diabetes mellitus    dx at age 66, not overweight was told he was type 1  . Hyperlipemia   . Ischemic cardiomyopathy    a. EF 45% by V-gram 04/2011;  b. Echo 04/19/2011 EF 55%, Gr 1 DD.  Marland Kitchen Myocardial infarction ([place])   . Prostate cancer ([place])   . Sleep apnea    has CPAP does not use  . Tobacco abuse    reports he quit after STEMI    Past Surgical History:  Procedure Laterality Date  . Cardiac cath with PCI  04/17/2011   DES to LAD  . COLONOSCOPY WITH PROPOFOL N/A 05/16/2014   Procedure: COLONOSCOPY WITH PROPOFOL;  Surgeon: [name], MD;  Location: [facility] ENDOSCOPY;  Service: Endoscopy;  Laterality: N/A;  . LEFT HEART  CATHETERIZATION WITH CORONARY ANGIOGRAM N/A 04/17/2011   Procedure: LEFT HEART CATHETERIZATION WITH CORONARY ANGIOGRAM;  Surgeon: Burnell Blanks, MD;  Location: [facility] CATH LAB;  Service: Cardiovascular;  Laterality: N/A;  . PERCUTANEOUS CORONARY STENT INTERVENTION (PCI-S)  04/17/2011   Procedure: PERCUTANEOUS CORONARY STENT INTERVENTION (PCI-S);  Surgeon: Burnell Blanks, MD;  Location: [facility] CATH LAB;  Service: Cardiovascular;;  . PROSTATECTOMY    . TONSILLECTOMY    . VASECTOMY      Current Outpatient Medications  Medication Sig Dispense Refill  . aspirin EC 81 MG tablet Take 81 mg by mouth every morning.     Marland Kitchen atorvastatin (LIPITOR) 20 MG tablet Take 1 tablet (20 mg total) by mouth daily. Please keep upcoming appt in September with Dr. Angelena Form for future refills. Thank you 90 tablet 0  . budesonide (ENTOCORT EC) 3 MG 24 hr capsule Take 3 mg by mouth as needed.     . carvedilol (COREG) 3.125 MG tablet TAKE 1 TABLET BY MOUTH 2 TIMES DAILY WITH A MEAL. 180 tablet 3  . Continuous Blood Gluc Receiver (FREESTYLE LIBRE READER) DEVI by Does not apply route. Check as directed by ENDO    . Continuous Blood Gluc Sensor ([place]) MISC by  Does not apply route. Check as directed    . GLUCAGON EMERGENCY 1 MG injection     . Insulin Glargine (BASAGLAR KWIKPEN) 100 UNIT/ML SOPN As directed.    Marland Kitchen lisinopril (PRINIVIL,ZESTRIL) 2.5 MG tablet TAKE 1/2 TABLET (1.25MG ) INTHE MORNING 45 tablet 3  . nitroGLYCERIN (NITROSTAT) 0.4 MG SL tablet Place 0.4 mg under the tongue every 5 (five) minutes as needed for chest pain.    Marland Kitchen NOVOLOG 100 UNIT/ML injection 1.08 Units every hour. Use as directed per sliding scale (insulin pump)    . ondansetron (ZOFRAN) 4 MG tablet Take 1 tablet (4 mg total) by mouth every 8 (eight) hours as needed. 12 tablet 0  . sildenafil (REVATIO) 20 MG tablet Take 40 mg by mouth. 3 times per week. 100mg  as needed    . verapamil (CALAN) 80 MG tablet Take 80 mg by mouth  as needed.     No current facility-administered medications for this visit.     Allergies  Allergen Reactions  . Ciprofloxacin     Social History   Socioeconomic History  . Marital status: Married    Spouse name: Not on file  . Number of children: 1  . Years of education: Not on file  . Highest education level: Not on file  Occupational History  . Occupation: PRICE ANALYST    Employer: [employer]    Comment: [employer]  Social Needs  . Financial resource strain: Not on file  . Food insecurity:    Worry: Not on file    Inability: Not on file  . Transportation needs:    Medical: Not on file    Non-medical: Not on file  Tobacco Use  . Smoking status: Former Smoker    Packs/day: 0.50    Years: 15.00    Pack years: 7.50    Types: Cigarettes    Last attempt to quit: 04/17/2011    Years since quitting: 6.4  . Smokeless tobacco: Never Used  Substance and Sexual Activity  . Alcohol use: Yes    Alcohol/week: 5.0 - 6.0 standard drinks    Types: 5 - 6 Glasses of wine per week    Comment: 2 glasses of wine most nights  . Drug use: No  . Sexual activity: Not Currently  Lifestyle  . Physical activity:    Days per week: Not on file    Minutes per session: Not on file  . Stress: Not on file  Relationships  . Social connections:    Talks on phone: Not on file    Gets together: Not on file    Attends religious service: Not on file    Active member of club or organization: Not on file    Attends meetings of clubs or organizations: Not on file    Relationship status: Not on file  . Intimate partner violence:    Fear of current or ex partner: Not on file    Emotionally abused: Not on file    Physically abused: Not on file    Forced sexual activity: Not on file  Other Topics Concern  . Not on file  Social History Narrative   Moved from [place] on July 2010    Household- pt and wife    Family History  Problem Relation Age of Onset  . Multiple sclerosis  Mother   . Parkinsonism Father   . Stroke Father   . Coronary artery disease Neg Hx   . Hypertension Neg Hx   . Diabetes  Neg Hx   . Colon cancer Neg Hx   . Prostate cancer Neg Hx   . Stomach cancer Neg Hx     Review of Systems:  As stated in the HPI and otherwise negative.   BP 110/80   Pulse (!) 53   Ht 5\' 8"  (1.727 m)   Wt 190 lb (86.2 kg)   BMI 28.89 kg/m   Physical Examination:  General: Well developed, well nourished, NAD  HEENT: OP clear, mucus membranes moist  SKIN: warm, dry. No rashes. Neuro: No focal deficits  Musculoskeletal: Muscle strength 5/5 all ext  Psychiatric: Mood and affect normal  Neck: No JVD, no carotid bruits, no thyromegaly, no lymphadenopathy.  Lungs:Clear bilaterally, no wheezes, rhonci, crackles Cardiovascular: Regular rate and rhythm. No murmurs, gallops or rubs. Abdomen:Soft. Bowel sounds present. Non-tender.  Extremities: No lower extremity edema. Pulses are 2 + in the bilateral DP/PT.  Echo October 2018: - Left ventricle: LVEF is normal with distal lateral and apical   hypokinesis. The cavity size was normal. Wall thickness was   normal. Systolic function was normal. The estimated ejection   fraction was in the range of 50% to 55%.  EKG:  EKG is ordered today. The ekg ordered today demonstrates Sinus brady, rate 53 bpm. Poor R wave progression  Recent Labs: 03/28/2017: ALT 19; Platelets 182 07/09/2017: BUN 17; Creatinine, Ser 1.00; Hemoglobin 12.9; Potassium 4.2; Sodium 138   Lipid Panel    Component Value Date/Time   CHOL 146 06/08/2016   TRIG 41 06/08/2016   HDL 66 06/08/2016   CHOLHDL 1.8 10/27/2011 0544   VLDL 10 10/27/2011 0544   LDLCALC 72 06/08/2016   LDLDIRECT 140.0 08/25/2010 1108     Wt Readings from Last 3 Encounters:  10/14/17 190 lb (86.2 kg)  07/09/17 183 lb (83 kg)  03/28/17 186 lb (84.4 kg)     Other studies Reviewed: Additional studies/ records that were reviewed today include: . Review of the above  records demonstrates:    Assessment and Plan:   1. CAD without angina:  Last cardiac cath 11/10/11 with patent LAD stent performed following chest pain and abnormal nuclear study. Echo October 2018 with normal LV systolic function. No chest pain. Will continue ASA, statin, beta blocker.  I will stop his Plavix.   2. HTN: BP is well controlled. No changes.   3. Tobacco abuse, in remission: He stopped smoking in 2013.  4. Hyperlipidemia: Lipids followed in primary care. Continue statin.    Current medicines are reviewed at length with the patient today.  The patient does not have concerns regarding medicines.  The following changes have been made:  no change  Labs/ tests ordered today include:   Orders Placed This Encounter  Procedures  . EKG 12-Lead    Disposition:   FU with me in 12  months  Signed, Lauree Chandler, MD 10/14/2017 12:02 PM    [facility] [place], [place], [place]  [address] Phone: [phone]; Fax: [phone]

## 2017-10-14 ENCOUNTER — Encounter: Payer: Self-pay | Admitting: Cardiovascular Disease

## 2017-10-14 ENCOUNTER — Encounter (INDEPENDENT_AMBULATORY_CARE_PROVIDER_SITE_OTHER): Payer: Self-pay

## 2017-10-14 ENCOUNTER — Ambulatory Visit (INDEPENDENT_AMBULATORY_CARE_PROVIDER_SITE_OTHER): Payer: No Typology Code available for payment source | Admitting: Cardiovascular Disease

## 2017-10-14 VITALS — BP 110/80 | HR 53 | Ht 68.0 in | Wt 190.0 lb

## 2017-10-14 DIAGNOSIS — I10 Essential (primary) hypertension: Secondary | ICD-10-CM | POA: Diagnosis not present

## 2017-10-14 DIAGNOSIS — I2510 Atherosclerotic heart disease of native coronary artery without angina pectoris: Secondary | ICD-10-CM

## 2017-10-14 DIAGNOSIS — E7800 Pure hypercholesterolemia, unspecified: Secondary | ICD-10-CM

## 2017-10-14 NOTE — Patient Instructions (Signed)
Medication Instructions:  Your physician has recommended you make the following change in your medication:  Stop Plavix   Labwork: none  Testing/Procedures: none  Follow-Up: Your physician recommends that you schedule a follow-up appointment in: 12 months. Please call our office in about 8 months to schedule this appointment    Any Other Special Instructions Will Be Listed Below (If Applicable).     If you need a refill on your cardiac medications before your next appointment, please call your pharmacy.

## 2017-10-18 ENCOUNTER — Telehealth: Payer: Self-pay | Admitting: *Deleted

## 2017-10-18 ENCOUNTER — Ambulatory Visit: Payer: Self-pay | Admitting: Surgery
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
## 2017-10-18 NOTE — Telephone Encounter (Addendum)
   [facility] Pre-operative Risk Assessment    Request for surgical clearance:  1. What type of surgery is being performed?  RIGHT INGUINAL HERNIA   2. When is this surgery scheduled?  NOVEMBER   3. What type of clearance is required (medical clearance vs. Pharmacy clearance to hold med vs. Both)?  MEDICAL  4. Are there any medications that need to be held prior to surgery and how long?  ASPIRIN IF OK'D, IF NOT ITS OK AS WELL.  5. Practice name and name of physician performing surgery?  [facility]   6. What is your office phone number [phone]    7.   What is your office fax number [phone]  8.   Anesthesia type (None, local, MAC, general) ?  GENERAL   Jeanann Lewandowsky 10/18/2017, 10:23 AM  _________________________________________________________________   (provider comments below)

## 2017-10-18 NOTE — Telephone Encounter (Signed)
   Primary Cardiologist: Lauree Chandler, MD  Chart reviewed as part of pre-operative protocol coverage. Given past medical history and time since last visit, based on ACC/AHA guidelines, Nathaniel [name] would be at acceptable risk for the planned procedure without further cardiovascular testing.   We would prefer patient remain on aspirin 81 mg if possible, will leave to the discretion of the surgeon.   I will route this recommendation to the requesting party via Epic fax function and remove from pre-op pool.  Please call with questions.  Kerin Ransom, PA-C 10/18/2017, 4:51 PM

## 2017-10-19 NOTE — Telephone Encounter (Signed)
Agree. Thanks

## 2017-11-12 ENCOUNTER — Other Ambulatory Visit: Payer: Self-pay | Admitting: Cardiovascular Disease

## 2017-11-28 ENCOUNTER — Other Ambulatory Visit: Payer: Self-pay | Admitting: Cardiovascular Disease

## 2017-12-16 ENCOUNTER — Encounter (HOSPITAL_COMMUNITY): Payer: Self-pay

## 2017-12-16 NOTE — Pre-Procedure Instructions (Signed)
Nathaniel [name]  12/16/2017      [id number] [address], [place] [address] [place] [address] Phone: [phone] Fax: [phone]  [facility] [address] [facility] [address] [place] [address] Phone: [phone] Fax: [phone]    Your procedure is scheduled on Tuesday November 19.  Report to [facility] Admitting at 5:30 A.M.  Call this number if you have problems the morning of surgery:  [phone]   Remember:  Do not eat or drink after midnight.  You may drink clear liquids until 4:30 AM (3 hours prior to surgery) .  Clear liquids allowed are:   Water, Juice (non-citric and without pulp), Clear Tea, Black Coffee only and Gatorade    Take these medicines the morning of surgery with A SIP OF WATER:   Carvedilol (Coreg) Verapamil (Calan) Zofran if needed Budesonide (Entocort)  Reduce basal rate by 20% at midnight the night before surgery OR contact your Endocrinologist for instructions regarding day before/day of surgery  Take 80% of Insulin Detemir (Levemir) the day before and day of surgery if needed (If insulin pump fails)     How to Manage Your Diabetes Before and After Surgery  Why is it important to control my blood sugar before and after surgery? . Improving blood sugar levels before and after surgery helps healing and can limit problems. . A way of improving blood sugar control is eating a healthy diet by: o  Eating less sugar and carbohydrates o  Increasing activity/exercise o  Talking with your doctor about reaching your blood sugar goals . High blood sugars (greater than 180 mg/dL) can raise your risk of infections and slow your recovery, so you will need to focus on controlling your diabetes during the weeks before surgery. . Make sure that the doctor who takes care of your diabetes knows about your planned  surgery including the date and location.  How do I manage my blood sugar before surgery? . Check your blood sugar at least 4 times a day, starting 2 days before surgery, to make sure that the level is not too high or low. o Check your blood sugar the morning of your surgery when you wake up and every 2 hours until you get to the Short Stay unit. . If your blood sugar is less than 70 mg/dL, you will need to treat for low blood sugar: o Do not take insulin. o Treat a low blood sugar (less than 70 mg/dL) with  cup of clear juice (cranberry or apple), 4 glucose tablets, OR glucose gel. Recheck blood sugar in 15 minutes after treatment (to make sure it is greater than 70 mg/dL). If your blood sugar is not greater than 70 mg/dL on recheck, call [phone] o  for further instructions. . Report your blood sugar to the short stay nurse when you get to Short Stay.  . If you are admitted to the hospital after surgery: o Your blood sugar will be checked by the staff and you will probably be given insulin after surgery (instead of oral diabetes medicines) to make sure you have good blood sugar levels. o The goal for blood sugar control after surgery is 80-180 mg/dL.                Do not wear jewelry  Do not wear lotions, powders, or colognes, or deodorant.  Do not  shave 48 hours prior to surgery.  Men may shave face and neck.  Do not bring valuables to the hospital.  [facility] is not responsible for any belongings or valuables.  Contacts, dentures or bridgework may not be worn into surgery.  Leave your suitcase in the car.  After surgery it may be brought to your room.  For patients admitted to the hospital, discharge time will be determined by your treatment team.  Patients discharged the day of surgery will not be allowed to drive home.    Special instructions:    [facility]- Preparing For Surgery  Before surgery, you can play an important role. Because skin is not sterile,  your skin needs to be as free of germs as possible. You can reduce the number of germs on your skin by washing with CHG (chlorahexidine gluconate) Soap before surgery.  CHG is an antiseptic cleaner which kills germs and bonds with the skin to continue killing germs even after washing.    Oral Hygiene is also important to reduce your risk of infection.  Remember - BRUSH YOUR TEETH THE MORNING OF SURGERY WITH YOUR REGULAR TOOTHPASTE  Please do not use if you have an allergy to CHG or antibacterial soaps. If your skin becomes reddened/irritated stop using the CHG.  Do not shave (including legs and underarms) for at least 48 hours prior to first CHG shower. It is OK to shave your face.  Please follow these instructions carefully.   1. Shower the NIGHT BEFORE SURGERY and the MORNING OF SURGERY with CHG.   2. If you chose to wash your hair, wash your hair first as usual with your normal shampoo.  3. After you shampoo, rinse your hair and body thoroughly to remove the shampoo.  4. Use CHG as you would any other liquid soap. You can apply CHG directly to the skin and wash gently with a scrungie or a clean washcloth.   5. Apply the CHG Soap to your body ONLY FROM THE NECK DOWN.  Do not use on open wounds or open sores. Avoid contact with your eyes, ears, mouth and genitals (private parts). Wash Face and genitals (private parts)  with your normal soap.  6. Wash thoroughly, paying special attention to the area where your surgery will be performed.  7. Thoroughly rinse your body with warm water from the neck down.  8. DO NOT shower/wash with your normal soap after using and rinsing off the CHG Soap.  9. Pat yourself dry with a CLEAN TOWEL.  10. Wear CLEAN PAJAMAS to bed the night before surgery, wear comfortable clothes the morning of surgery  11. Place CLEAN SHEETS on your bed the night of your first shower and DO NOT SLEEP WITH PETS.    Day of Surgery:  Do not apply any deodorants/lotions.    Please wear clean clothes to the hospital/surgery center.   Remember to brush your teeth WITH YOUR REGULAR TOOTHPASTE.    Please read over the following fact sheets that you were given. Coughing and Deep Breathing and Surgical Site Infection Prevention

## 2017-12-19 ENCOUNTER — Other Ambulatory Visit: Payer: Self-pay

## 2017-12-19 ENCOUNTER — Encounter (HOSPITAL_COMMUNITY): Payer: Self-pay

## 2017-12-19 ENCOUNTER — Encounter (HOSPITAL_COMMUNITY)
Admission: RE | Admit: 2017-12-19 | Discharge: 2017-12-19 | Disposition: A | Discharge status: Home / Self Care | Payer: No Typology Code available for payment source | Source: Ambulatory Visit | Attending: Surgery | Admitting: Surgery

## 2017-12-19 DIAGNOSIS — Z01818 Encounter for other preprocedural examination: Secondary | ICD-10-CM | POA: Insufficient documentation

## 2017-12-19 DIAGNOSIS — K4090 Unilateral inguinal hernia, without obstruction or gangrene, not specified as recurrent: Secondary | ICD-10-CM | POA: Diagnosis not present

## 2017-12-19 HISTORY — DX: Other specified postprocedural states: Z98.890

## 2017-12-19 HISTORY — DX: Other specified postprocedural states: R11.2

## 2017-12-19 LAB — CBC WITH DIFFERENTIAL/PLATELET
Abs Immature Granulocytes: 0.02 10*3/uL (ref 0.00–0.07)
BASOS PCT: 2 %
Basophils Absolute: 0.1 10*3/uL (ref 0.0–0.1)
EOS PCT: 2 %
Eosinophils Absolute: 0.1 10*3/uL (ref 0.0–0.5)
HCT: 42.3 % (ref 39.0–52.0)
HEMOGLOBIN: 13.9 g/dL (ref 13.0–17.0)
Immature Granulocytes: 0 %
Lymphocytes Relative: 22 %
Lymphs Abs: 1.3 10*3/uL (ref 0.7–4.0)
MCH: 32.4 pg (ref 26.0–34.0)
MCHC: 32.9 g/dL (ref 30.0–36.0)
MCV: 98.6 fL (ref 80.0–100.0)
MONO ABS: 0.5 10*3/uL (ref 0.1–1.0)
MONOS PCT: 9 %
Neutro Abs: 3.8 10*3/uL (ref 1.7–7.7)
Neutrophils Relative %: 65 %
Platelets: 264 10*3/uL (ref 150–400)
RBC: 4.29 MIL/uL (ref 4.22–5.81)
RDW: 12.9 % (ref 11.5–15.5)
WBC: 5.8 10*3/uL (ref 4.0–10.5)
nRBC: 0 % (ref 0.0–0.2)

## 2017-12-19 LAB — COMPREHENSIVE METABOLIC PANEL
ALK PHOS: 83 U/L (ref 38–126)
ALT: 24 U/L (ref 0–44)
ANION GAP: 6 (ref 5–15)
AST: 23 U/L (ref 15–41)
Albumin: 4.1 g/dL (ref 3.5–5.0)
BUN: 19 mg/dL (ref 6–20)
CO2: 25 mmol/L (ref 22–32)
CREATININE: 1.19 mg/dL (ref 0.61–1.24)
Calcium: 9.1 mg/dL (ref 8.9–10.3)
Chloride: 105 mmol/L (ref 98–111)
Glucose, Bld: 215 mg/dL — ABNORMAL HIGH (ref 70–99)
Potassium: 5.3 mmol/L — ABNORMAL HIGH (ref 3.5–5.1)
SODIUM: 136 mmol/L (ref 135–145)
TOTAL PROTEIN: 7.1 g/dL (ref 6.5–8.1)
Total Bilirubin: 0.8 mg/dL (ref 0.3–1.2)

## 2017-12-19 LAB — GLUCOSE, CAPILLARY: GLUCOSE-CAPILLARY: 180 mg/dL — AB (ref 70–99)

## 2017-12-19 LAB — HEMOGLOBIN A1C
HEMOGLOBIN A1C: 5.6 % (ref 4.8–5.6)
MEAN PLASMA GLUCOSE: 114.02 mg/dL

## 2017-12-19 MED ORDER — CHLORHEXIDINE GLUCONATE CLOTH 2 % EX PADS: 6.0000 | MEDICATED_PAD | Freq: Once | CUTANEOUS | Status: DC

## 2017-12-19 NOTE — Anesthesia Preprocedure Evaluation (Addendum)
Anesthesia Evaluation  Patient identified by MRN, date of birth, ID band Patient awake    Reviewed: Allergy & Precautions, NPO status , Patient's Chart, lab work & pertinent test results  History of Anesthesia Complications (+) PONV  Airway Mallampati: I  TM Distance: >3 FB Neck ROM: Full    Dental   Pulmonary sleep apnea , former smoker,    Pulmonary exam normal        Cardiovascular + CAD and + Past MI  Normal cardiovascular exam     Neuro/Psych Depression    GI/Hepatic   Endo/Other  diabetes, Type 1, Insulin Dependent  Renal/GU      Musculoskeletal   Abdominal   Peds  Hematology   Anesthesia Other Findings   Reproductive/Obstetrics                            Anesthesia Physical Anesthesia Plan  ASA: III  Anesthesia Plan: General   Post-op Pain Management:  Regional for Post-op pain   Induction: Intravenous  PONV Risk Score and Plan: 3 and Ondansetron, Midazolam, Scopolamine patch - Pre-op and Diphenhydramine  Airway Management Planned: LMA  Additional Equipment:   Intra-op Plan:   Post-operative Plan: Extubation in OR  Informed Consent: I have reviewed the patients History and Physical, chart, labs and discussed the procedure including the risks, benefits and alternatives for the proposed anesthesia with the patient or authorized representative who has indicated his/her understanding and acceptance.     Plan Discussed with: CRNA and Surgeon  Anesthesia Plan Comments: (Cardiac clearance by Kerin Ransom, PA-C 10/18/2017. Primary cardiologist Dr. Angelena Form, last seen 10/14/17, per his note "Last cardiac cath 11/10/11 with patent LAD stent performed following chest pain and abnormal nuclear study. Echo October 2018 with normal LV systolic function. No chest pain. Will continue ASA, statin, beta blocker. I will stop his Plavix.")       Anesthesia Quick Evaluation

## 2017-12-19 NOTE — Progress Notes (Addendum)
PCP: Kathlene November, MD  Cardiologist: Lauree Chandler, MD  EKG: 10/14/17 in EPIC  Stress test: 11/02/11 in EPIC  ECHO: 11/08/16 in EPIC  Cardiac Cath: 11/10/11 in Epic  Chest x-ray: 03/28/17 in EPIC   Patient has insulin pump, patient advised to decrease basal rate by 20% beginning at [place] the night be surgery.Surgery is less than 2 hours so pump should remain on

## 2017-12-24 IMAGING — CR DG CHEST 2V
2 series · 2 of 2 positions shown · non-contrast
Comparison: PA and lateral chest x-ray October 26, 2011.

CLINICAL DATA: Painful swallowing for the past 2 months. Former
smoker.

EXAM:
CHEST  2 VIEW

[w chest pa]
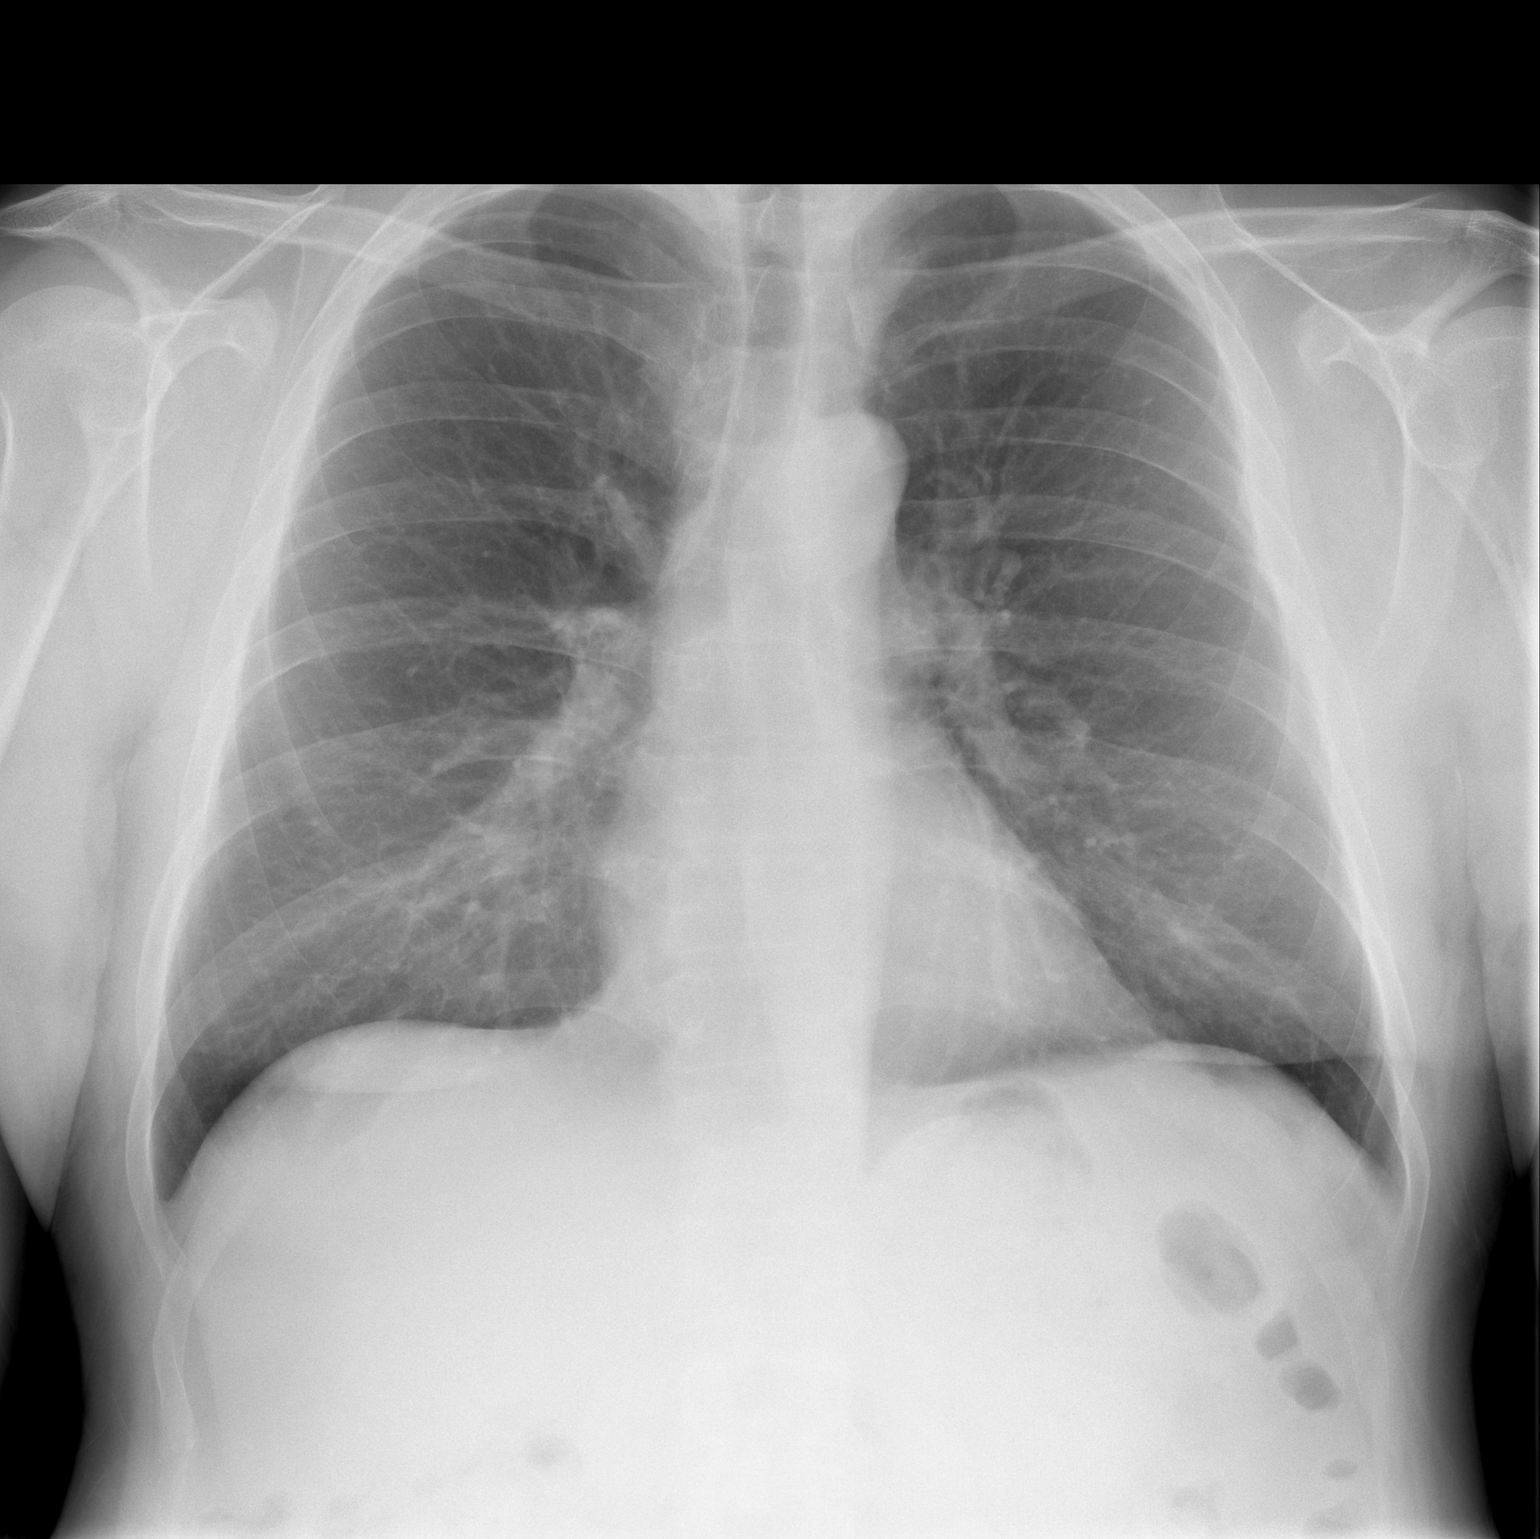

[w chest lat]
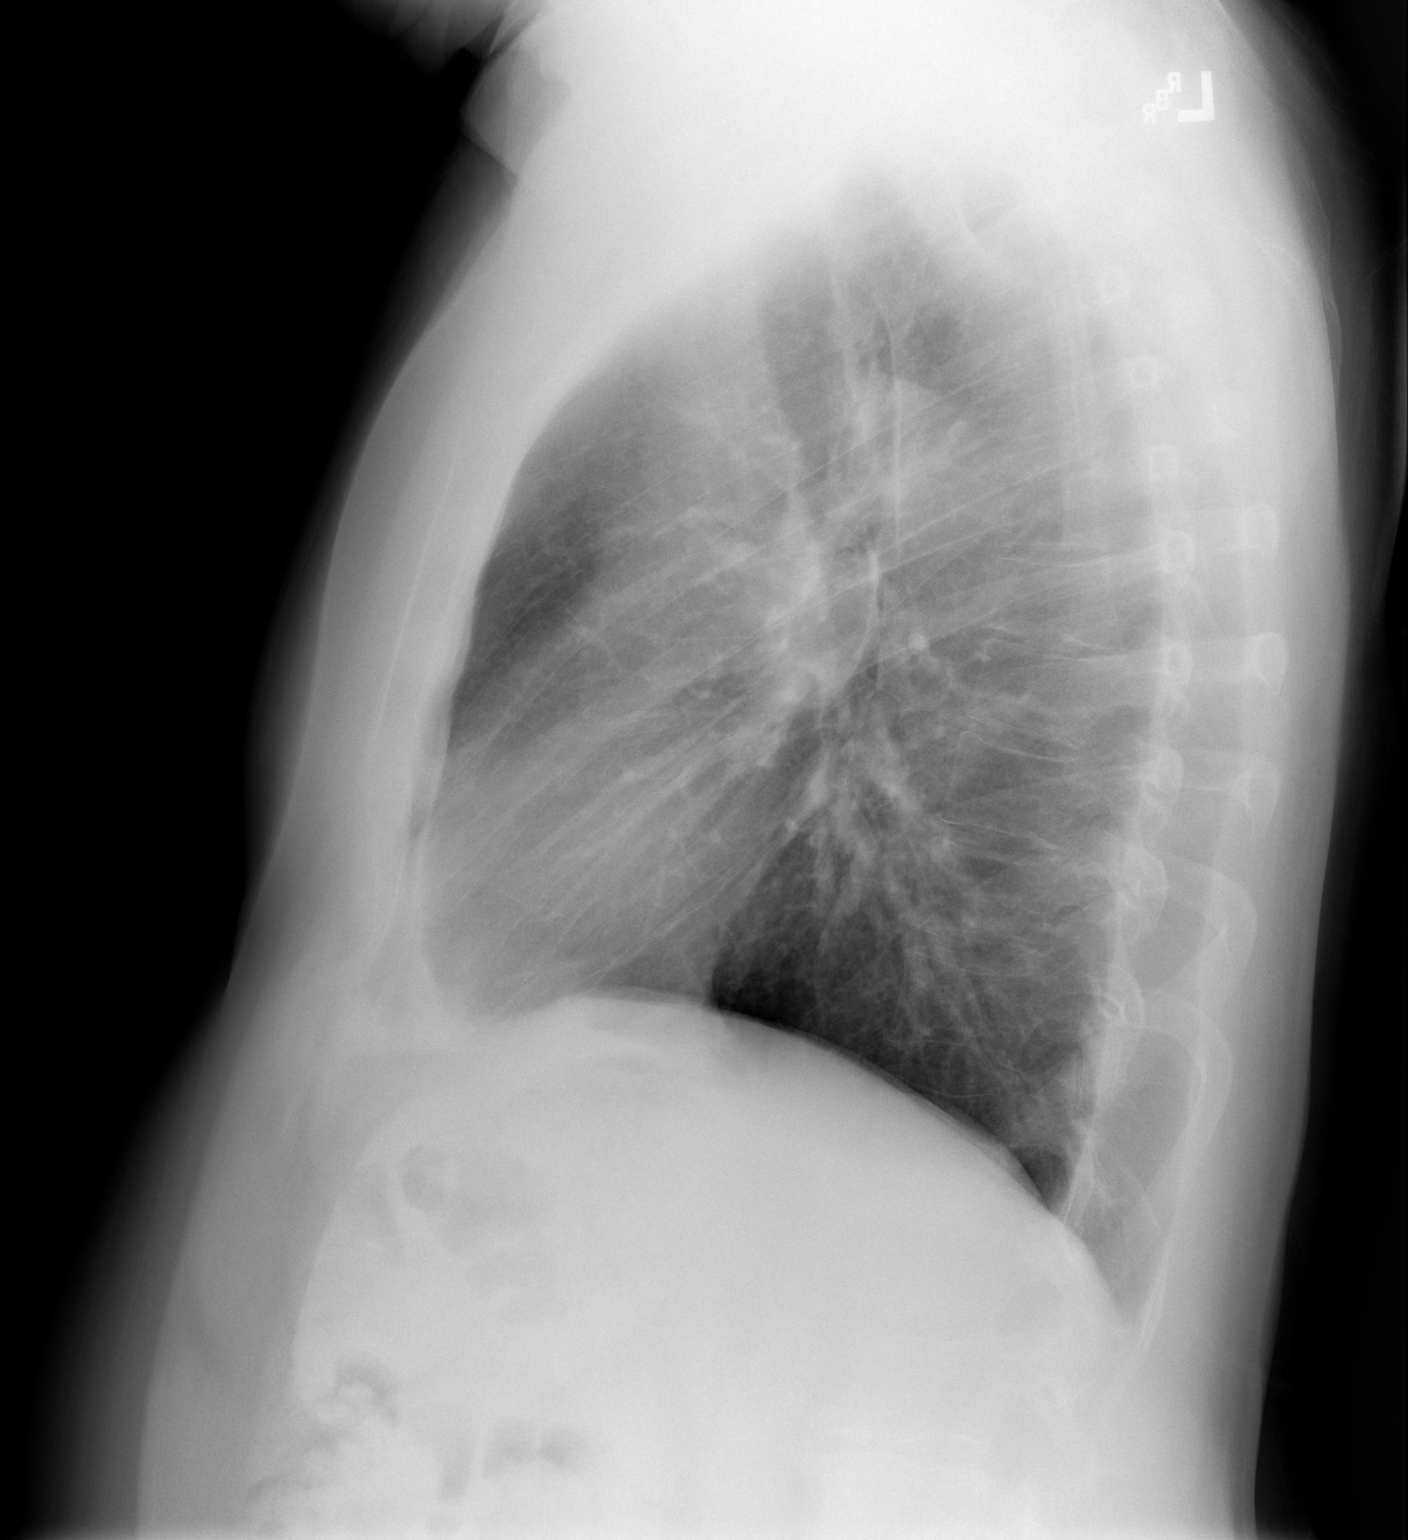

[2 of 2 positions shown; findings below may reference images not displayed]

FINDINGS: The lungs are mildly hyperinflated and clear. The heart and
pulmonary vascularity are normal. The mediastinum is normal in
width. The trachea is midline. There is no pleural effusion. The
bony thorax is unremarkable.
IMPRESSION: There is no acute cardiopulmonary abnormality. No mediastinal
abnormality is observed either. Given the patient's symptoms, upper
endoscopy or a barium swallow examination would be useful next
imaging steps.

## 2017-12-27 ENCOUNTER — Encounter (HOSPITAL_COMMUNITY): Payer: Self-pay | Admitting: *Deleted

## 2017-12-27 ENCOUNTER — Ambulatory Visit (HOSPITAL_COMMUNITY)
Admission: RE | Admit: 2017-12-27 | Discharge: 2017-12-27 | Disposition: A | Discharge status: Home / Self Care | Payer: No Typology Code available for payment source | Source: Ambulatory Visit | Attending: Surgery | Admitting: Surgery

## 2017-12-27 ENCOUNTER — Ambulatory Visit (HOSPITAL_COMMUNITY): Payer: No Typology Code available for payment source | Admitting: Physician Assistant

## 2017-12-27 ENCOUNTER — Encounter (HOSPITAL_COMMUNITY)
Admission: RE | Disposition: A | Discharge status: Home / Self Care | Payer: Self-pay | Source: Ambulatory Visit | Attending: Surgery

## 2017-12-27 ENCOUNTER — Ambulatory Visit (HOSPITAL_COMMUNITY): Payer: No Typology Code available for payment source | Admitting: Certified Registered Nurse Anesthetist

## 2017-12-27 DIAGNOSIS — Z7982 Long term (current) use of aspirin: Secondary | ICD-10-CM | POA: Insufficient documentation

## 2017-12-27 DIAGNOSIS — I252 Old myocardial infarction: Secondary | ICD-10-CM | POA: Insufficient documentation

## 2017-12-27 DIAGNOSIS — G4730 Sleep apnea, unspecified: Secondary | ICD-10-CM | POA: Diagnosis not present

## 2017-12-27 DIAGNOSIS — K4090 Unilateral inguinal hernia, without obstruction or gangrene, not specified as recurrent: Secondary | ICD-10-CM | POA: Insufficient documentation

## 2017-12-27 DIAGNOSIS — C61 Malignant neoplasm of prostate: Secondary | ICD-10-CM | POA: Insufficient documentation

## 2017-12-27 DIAGNOSIS — E119 Type 2 diabetes mellitus without complications: Secondary | ICD-10-CM | POA: Diagnosis not present

## 2017-12-27 DIAGNOSIS — Z87891 Personal history of nicotine dependence: Secondary | ICD-10-CM | POA: Insufficient documentation

## 2017-12-27 DIAGNOSIS — Z794 Long term (current) use of insulin: Secondary | ICD-10-CM | POA: Insufficient documentation

## 2017-12-27 DIAGNOSIS — I2510 Atherosclerotic heart disease of native coronary artery without angina pectoris: Secondary | ICD-10-CM | POA: Insufficient documentation

## 2017-12-27 HISTORY — PX: INGUINAL HERNIA REPAIR: SHX194

## 2017-12-27 LAB — GLUCOSE, CAPILLARY
GLUCOSE-CAPILLARY: 169 mg/dL — AB (ref 70–99)
GLUCOSE-CAPILLARY: 119 mg/dL — AB (ref 70–99)

## 2017-12-27 SURGERY — REPAIR, HERNIA, INGUINAL, ADULT
Anesthesia: General | Site: Groin | Laterality: Right

## 2017-12-27 MED ORDER — EPHEDRINE SULFATE 50 MG/ML IJ SOLN
INTRAMUSCULAR | Status: DC | PRN
  Administered 2017-12-27 (×2): 5 mg via INTRAVENOUS
  Administered 2017-12-27: 10 mg via INTRAVENOUS
  Administered 2017-12-27: 5 mg via INTRAVENOUS

## 2017-12-27 MED ORDER — OXYCODONE HCL 5 MG PO TABS
5.0000 mg | ORAL_TABLET | Freq: Once | ORAL | Status: AC
  Administered 2017-12-27: 5 mg via ORAL

## 2017-12-27 MED ORDER — PROPOFOL 10 MG/ML IV BOLUS
INTRAVENOUS | Status: AC
  Filled 2017-12-27: qty 20

## 2017-12-27 MED ORDER — GABAPENTIN 300 MG PO CAPS
300.0000 mg | ORAL_CAPSULE | ORAL | Status: AC
  Administered 2017-12-27: 300 mg via ORAL

## 2017-12-27 MED ORDER — PROPOFOL 10 MG/ML IV BOLUS
INTRAVENOUS | Status: DC | PRN
  Administered 2017-12-27: 150 mg via INTRAVENOUS

## 2017-12-27 MED ORDER — MEPERIDINE HCL 50 MG/ML IJ SOLN: 6.2500 mg | INTRAMUSCULAR | Status: DC | PRN

## 2017-12-27 MED ORDER — HYDROMORPHONE HCL 1 MG/ML IJ SOLN: 0.2500 mg | INTRAMUSCULAR | Status: DC | PRN

## 2017-12-27 MED ORDER — OXYCODONE HCL 5 MG PO TABS
ORAL_TABLET | ORAL | Status: AC
  Filled 2017-12-27: qty 1

## 2017-12-27 MED ORDER — CELECOXIB 200 MG PO CAPS
400.0000 mg | ORAL_CAPSULE | ORAL | Status: AC
  Administered 2017-12-27: 400 mg via ORAL

## 2017-12-27 MED ORDER — ACETAMINOPHEN 500 MG PO TABS
ORAL_TABLET | ORAL | Status: AC
  Administered 2017-12-27: 1000 mg via ORAL
  Filled 2017-12-27: qty 2

## 2017-12-27 MED ORDER — PROMETHAZINE HCL 25 MG/ML IJ SOLN
6.2500 mg | INTRAMUSCULAR | Status: AC
  Administered 2017-12-27: 6.25 mg via INTRAVENOUS
  Filled 2017-12-27: qty 1

## 2017-12-27 MED ORDER — PHENYLEPHRINE 40 MCG/ML (10ML) SYRINGE FOR IV PUSH (FOR BLOOD PRESSURE SUPPORT)
PREFILLED_SYRINGE | INTRAVENOUS | Status: AC
  Filled 2017-12-27: qty 10

## 2017-12-27 MED ORDER — CELECOXIB 200 MG PO CAPS
ORAL_CAPSULE | ORAL | Status: AC
  Administered 2017-12-27: 400 mg via ORAL
  Filled 2017-12-27: qty 1

## 2017-12-27 MED ORDER — OXYCODONE HCL 5 MG PO TABS: 5.0000 mg | ORAL_TABLET | Freq: Four times a day (QID) | ORAL | 0 refills | Status: DC | PRN

## 2017-12-27 MED ORDER — ACETAMINOPHEN 500 MG PO TABS
1000.0000 mg | ORAL_TABLET | ORAL | Status: AC
  Administered 2017-12-27: 1000 mg via ORAL

## 2017-12-27 MED ORDER — EPHEDRINE 5 MG/ML INJ
INTRAVENOUS | Status: AC
  Filled 2017-12-27: qty 10

## 2017-12-27 MED ORDER — BUPIVACAINE-EPINEPHRINE (PF) 0.25% -1:200000 IJ SOLN
INTRAMUSCULAR | Status: AC
  Filled 2017-12-27: qty 30

## 2017-12-27 MED ORDER — ROCURONIUM BROMIDE 50 MG/5ML IV SOSY
PREFILLED_SYRINGE | INTRAVENOUS | Status: AC
  Filled 2017-12-27: qty 5

## 2017-12-27 MED ORDER — MIDAZOLAM HCL 5 MG/5ML IJ SOLN
INTRAMUSCULAR | Status: DC | PRN
  Administered 2017-12-27: 2 mg via INTRAVENOUS

## 2017-12-27 MED ORDER — IBUPROFEN 800 MG PO TABS: 800.0000 mg | ORAL_TABLET | Freq: Three times a day (TID) | ORAL | 0 refills | Status: DC | PRN

## 2017-12-27 MED ORDER — CELECOXIB 200 MG PO CAPS
ORAL_CAPSULE | ORAL | Status: AC
  Filled 2017-12-27: qty 1

## 2017-12-27 MED ORDER — FENTANYL CITRATE (PF) 250 MCG/5ML IJ SOLN
INTRAMUSCULAR | Status: AC
  Filled 2017-12-27: qty 5

## 2017-12-27 MED ORDER — CEFAZOLIN SODIUM-DEXTROSE 2-4 GM/100ML-% IV SOLN
2.0000 g | INTRAVENOUS | Status: AC
  Administered 2017-12-27: 2 g via INTRAVENOUS
  Filled 2017-12-27: qty 100

## 2017-12-27 MED ORDER — LIDOCAINE HCL (CARDIAC) PF 100 MG/5ML IV SOSY
PREFILLED_SYRINGE | INTRAVENOUS | Status: DC | PRN
  Administered 2017-12-27: 80 mg via INTRAVENOUS

## 2017-12-27 MED ORDER — SUCCINYLCHOLINE CHLORIDE 200 MG/10ML IV SOSY
PREFILLED_SYRINGE | INTRAVENOUS | Status: AC
  Filled 2017-12-27: qty 10

## 2017-12-27 MED ORDER — BUPIVACAINE-EPINEPHRINE 0.25% -1:200000 IJ SOLN
INTRAMUSCULAR | Status: DC | PRN
  Administered 2017-12-27: 10 mL

## 2017-12-27 MED ORDER — BUPIVACAINE-EPINEPHRINE (PF) 0.5% -1:200000 IJ SOLN
INTRAMUSCULAR | Status: DC | PRN
  Administered 2017-12-27: 30 mL

## 2017-12-27 MED ORDER — GABAPENTIN 300 MG PO CAPS
ORAL_CAPSULE | ORAL | Status: AC
  Administered 2017-12-27: 300 mg via ORAL
  Filled 2017-12-27: qty 1

## 2017-12-27 MED ORDER — SCOPOLAMINE 1 MG/3DAYS TD PT72
MEDICATED_PATCH | TRANSDERMAL | Status: AC
  Filled 2017-12-27: qty 1

## 2017-12-27 MED ORDER — ONDANSETRON HCL 4 MG/2ML IJ SOLN
INTRAMUSCULAR | Status: DC | PRN
  Administered 2017-12-27: 4 mg via INTRAVENOUS

## 2017-12-27 MED ORDER — ONDANSETRON HCL 4 MG/2ML IJ SOLN: 4.0000 mg | Freq: Once | INTRAMUSCULAR | Status: DC | PRN

## 2017-12-27 MED ORDER — ONDANSETRON HCL 4 MG/2ML IJ SOLN
INTRAMUSCULAR | Status: AC
  Filled 2017-12-27: qty 2

## 2017-12-27 MED ORDER — LACTATED RINGERS IV SOLN
INTRAVENOUS | Status: DC | PRN
  Administered 2017-12-27 (×2): via INTRAVENOUS

## 2017-12-27 MED ORDER — LIDOCAINE 2% (20 MG/ML) 5 ML SYRINGE
INTRAMUSCULAR | Status: AC
  Filled 2017-12-27: qty 5

## 2017-12-27 MED ORDER — DEXAMETHASONE SODIUM PHOSPHATE 10 MG/ML IJ SOLN
INTRAMUSCULAR | Status: AC
  Filled 2017-12-27: qty 2

## 2017-12-27 MED ORDER — MIDAZOLAM HCL 2 MG/2ML IJ SOLN
INTRAMUSCULAR | Status: AC
  Filled 2017-12-27: qty 2

## 2017-12-27 MED ORDER — FENTANYL CITRATE (PF) 250 MCG/5ML IJ SOLN
INTRAMUSCULAR | Status: DC | PRN
  Administered 2017-12-27: 25 ug via INTRAVENOUS
  Administered 2017-12-27: 100 ug via INTRAVENOUS
  Administered 2017-12-27 (×3): 25 ug via INTRAVENOUS

## 2017-12-27 SURGICAL SUPPLY — 46 items
BLADE CLIPPER SURG (BLADE) IMPLANT
CANISTER SUCT 3000ML PPV (MISCELLANEOUS) IMPLANT
CHLORAPREP W/TINT 26ML (MISCELLANEOUS) ×2 IMPLANT
COVER SURGICAL LIGHT HANDLE (MISCELLANEOUS) ×2 IMPLANT
COVER WAND RF STERILE (DRAPES) ×2 IMPLANT
DERMABOND ADHESIVE PROPEN (GAUZE/BANDAGES/DRESSINGS) ×1
DERMABOND ADVANCED (GAUZE/BANDAGES/DRESSINGS) ×1
DERMABOND ADVANCED .7 DNX12 (GAUZE/BANDAGES/DRESSINGS) ×1 IMPLANT
DERMABOND ADVANCED .7 DNX6 (GAUZE/BANDAGES/DRESSINGS) ×1 IMPLANT
DRAIN PENROSE 1/2X12 LTX STRL (WOUND CARE) IMPLANT
DRAPE LAPAROTOMY TRNSV 102X78 (DRAPE) ×2 IMPLANT
DRAPE UTILITY XL STRL (DRAPES) ×4 IMPLANT
ELECT CAUTERY BLADE 6.4 (BLADE) ×2 IMPLANT
ELECT REM PT RETURN 9FT ADLT (ELECTROSURGICAL) ×2
ELECTRODE REM PT RTRN 9FT ADLT (ELECTROSURGICAL) ×1 IMPLANT
GLOVE BIO SURGEON STRL SZ8 (GLOVE) ×2 IMPLANT
GLOVE BIOGEL PI IND STRL 8 (GLOVE) ×1 IMPLANT
GLOVE BIOGEL PI INDICATOR 8 (GLOVE) ×1
GOWN STRL REUS W/ TWL LRG LVL3 (GOWN DISPOSABLE) ×1 IMPLANT
GOWN STRL REUS W/ TWL XL LVL3 (GOWN DISPOSABLE) ×1 IMPLANT
GOWN STRL REUS W/TWL LRG LVL3 (GOWN DISPOSABLE) ×1
GOWN STRL REUS W/TWL XL LVL3 (GOWN DISPOSABLE) ×1
KIT BASIN OR (CUSTOM PROCEDURE TRAY) ×2 IMPLANT
KIT TURNOVER KIT B (KITS) ×2 IMPLANT
MESH HERNIA SYS ULTRAPRO LRG (Mesh General) ×2 IMPLANT
NEEDLE HYPO 25GX1X1/2 BEV (NEEDLE) ×2 IMPLANT
NS IRRIG 1000ML POUR BTL (IV SOLUTION) ×2 IMPLANT
PACK SURGICAL SETUP 50X90 (CUSTOM PROCEDURE TRAY) ×2 IMPLANT
PAD ARMBOARD 7.5X6 YLW CONV (MISCELLANEOUS) ×2 IMPLANT
PENCIL BUTTON HOLSTER BLD 10FT (ELECTRODE) ×2 IMPLANT
SPONGE LAP 18X18 X RAY DECT (DISPOSABLE) ×2 IMPLANT
SUT MNCRL AB 4-0 PS2 18 (SUTURE) ×2 IMPLANT
SUT NOVA NAB DX-16 0-1 5-0 T12 (SUTURE) ×4 IMPLANT
SUT SILK 2 0 SH (SUTURE) IMPLANT
SUT VIC AB 0 CT1 27 (SUTURE)
SUT VIC AB 0 CT1 27XBRD ANBCTR (SUTURE) IMPLANT
SUT VIC AB 2-0 SH 27 (SUTURE) ×1
SUT VIC AB 2-0 SH 27X BRD (SUTURE) ×1 IMPLANT
SUT VIC AB 3-0 SH 18 (SUTURE) ×2 IMPLANT
SUT VICRYL AB 3 0 TIES (SUTURE) ×2 IMPLANT
SYR BULB 3OZ (MISCELLANEOUS) IMPLANT
SYR CONTROL 10ML LL (SYRINGE) ×2 IMPLANT
TOWEL OR 17X24 6PK STRL BLUE (TOWEL DISPOSABLE) ×2 IMPLANT
TOWEL OR 17X26 10 PK STRL BLUE (TOWEL DISPOSABLE) ×2 IMPLANT
TUBE CONNECTING 12X1/4 (SUCTIONS) IMPLANT
YANKAUER SUCT BULB TIP NO VENT (SUCTIONS) IMPLANT

## 2017-12-27 NOTE — Anesthesia Procedure Notes (Signed)
Procedure Name: LMA Insertion Date/Time: 12/27/2017 7:39 AM Performed by: Shirlyn Goltz, CRNA Pre-anesthesia Checklist: Patient identified, Emergency Drugs available, Suction available and Patient being monitored Patient Re-evaluated:Patient Re-evaluated prior to induction Oxygen Delivery Method: Circle system utilized Preoxygenation: Pre-oxygenation with 100% oxygen Induction Type: IV induction Ventilation: Mask ventilation without difficulty LMA: LMA with gastric port inserted LMA Size: 5.0 Number of attempts: 1 Placement Confirmation: positive ETCO2 and breath sounds checked- equal and bilateral Tube secured with: Tape Dental Injury: Teeth and Oropharynx as per pre-operative assessment

## 2017-12-27 NOTE — Anesthesia Procedure Notes (Signed)
Anesthesia Regional Block: TAP block   Pre-Anesthetic Checklist: ,, timeout performed, Correct Patient, Correct Site, Correct Laterality, Correct Procedure, Correct Position, site marked, Risks and benefits discussed,  Surgical consent,  Pre-op evaluation,  At surgeon's request and post-op pain management  Laterality: Right  Prep: chloraprep, alcohol swabs       Needles:  Injection technique: Single-shot  Needle Type: Echogenic Stimulator Needle     Needle Length: 9cm  Needle Gauge: 21     Additional Needles:   Narrative:  Start time: 12/27/2017 7:05 AM End time: 12/27/2017 7:15 AM Injection made incrementally with aspirations every 5 mL.  Performed by: Personally  Anesthesiologist: Lillia Abed, MD  Additional Notes: Monitors applied. Patient sedated. Sterile prep and drape,hand hygiene and sterile gloves were used. Relevant anatomy identified.Needle position confirmed.Local anesthetic injected incrementally after negative aspiration. Local anesthetic spread visualized in Transversus Abdominus Plane. Vascular puncture avoided. No complications. Image printed for medical record.The patient tolerated the procedure well.

## 2017-12-27 NOTE — Anesthesia Postprocedure Evaluation (Signed)
Anesthesia Post Note  Patient: Nathaniel [name]  Procedure(s) Performed: RIGHT INGUINAL HERNIA REPAIR WITH MESH (Right Groin)     Patient location during evaluation: PACU Anesthesia Type: Regional Level of consciousness: awake and alert Pain management: pain level controlled Vital Signs Assessment: post-procedure vital signs reviewed and stable Respiratory status: spontaneous breathing, nonlabored ventilation, respiratory function stable and patient connected to nasal cannula oxygen Cardiovascular status: blood pressure returned to baseline and stable Postop Assessment: no apparent nausea or vomiting Anesthetic complications: no    Last Vitals:  Vitals:   12/27/17 0915 12/27/17 0930  BP: 115/77 121/78  Pulse: (!) 47 (!) 45  Resp: 12 15  Temp:    SpO2: 100% 99%    Last Pain:  Vitals:   12/27/17 0930  TempSrc:   PainSc: 3                  Zyquan Crotty DAVID

## 2017-12-27 NOTE — Transfer of Care (Signed)
Immediate Anesthesia Transfer of Care Note  Patient: Nathaniel [name]  Procedure(s) Performed: RIGHT INGUINAL HERNIA REPAIR WITH MESH (Right Groin)  Patient Location: PACU  Anesthesia Type:General  Level of Consciousness: awake and patient cooperative  Airway & Oxygen Therapy: Patient Spontanous Breathing and Patient connected to nasal cannula oxygen  Post-op Assessment: Report given to RN and Post -op Vital signs reviewed and stable  Post vital signs: Reviewed and stable  Last Vitals:  Vitals Value Taken Time  BP 118/75 12/27/2017  9:00 AM  Temp    Pulse 49 12/27/2017  9:01 AM  Resp 11 12/27/2017  9:01 AM  SpO2 100 % 12/27/2017  9:01 AM  Vitals shown include unvalidated device data.  Last Pain:  Vitals:   12/27/17 0611  TempSrc:   PainSc: 0-No pain         Complications: No apparent anesthesia complications

## 2017-12-27 NOTE — Discharge Instructions (Signed)
[facility] _______[facility]  UMBILICAL OR INGUINAL HERNIA REPAIR: POST OP INSTRUCTIONS  Always review your discharge instruction sheet given to you by the facility where your surgery was performed. IF YOU HAVE DISABILITY OR FAMILY LEAVE FORMS, YOU MUST BRING THEM TO THE OFFICE FOR PROCESSING.   DO NOT GIVE THEM TO YOUR DOCTOR.  1. A  prescription for pain medication may be given to you upon discharge.  Take your pain medication as prescribed, if needed.  If narcotic pain medicine is not needed, then you may take acetaminophen (Tylenol) or ibuprofen (Advil) as needed. 2. Take your usually prescribed medications unless otherwise directed. If you need a refill on your pain medication, please contact your pharmacy.  They will contact our office to request authorization. Prescriptions will not be filled after 5 pm or on week-ends. 3. You should follow a light diet the first 24 hours after arrival home, such as soup and crackers, etc.  Be sure to include lots of fluids daily.  Resume your normal diet the day after surgery. 4.Most patients will experience some swelling and bruising around the umbilicus or in the groin and scrotum.  Ice packs and reclining will help.  Swelling and bruising can take several days to resolve.  6. It is common to experience some constipation if taking pain medication after surgery.  Increasing fluid intake and taking a stool softener (such as Colace) will usually help or prevent this problem from occurring.  A mild laxative (Milk of Magnesia or Miralax) should be taken according to package directions if there are no bowel movements after 48 hours. 7. Unless discharge instructions indicate otherwise, you may remove your bandages 24-48 hours after surgery, and you may shower at that time.  You may have steri-strips (small skin tapes) in place directly over the incision.  These strips should be left on the skin for 7-10 days.  If your surgeon used skin glue on the  incision, you may shower in 24 hours.  The glue will flake off over the next 2-3 weeks.  Any sutures or staples will be removed at the office during your follow-up visit. 8. ACTIVITIES:  You may resume regular (light) daily activities beginning the next day--such as daily self-care, walking, climbing stairs--gradually increasing activities as tolerated.  You may have sexual intercourse when it is comfortable.  Refrain from any heavy lifting or straining until approved by your doctor.  a.You may drive when you are no longer taking prescription pain medication, you can comfortably wear a seatbelt, and you can safely maneuver your car and apply brakes. b.RETURN TO WORK:   _____________________no lifting greater than 20 lbs for 4 weeks ________________________  9.You should see your doctor in the office for a follow-up appointment approximately 2-3 weeks after your surgery.  Make sure that you call for this appointment within a day or two after you arrive home to insure a convenient appointment time. 10.OTHER INSTRUCTIONS: _________________________    _____________________________________  WHEN TO CALL YOUR DOCTOR: 1. Fever over 101.0 2. Inability to urinate 3. Nausea and/or vomiting 4. Extreme swelling or bruising 5. Continued bleeding from incision. 6. Increased pain, redness, or drainage from the incision  The clinic staff is available to answer your questions during regular business hours.  Please dont hesitate to call and ask to speak to one of the nurses for clinical concerns.  If you have a medical emergency, go to the nearest emergency room or call 911.  A surgeon from [facility]  is always on call at the hospital   [address] [place], [place], [place]  [address] ?  [address], [place], [place]   [address] [phone] ? [phone] ? FAX [phone] Web site: [email or web address]
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
## 2017-12-27 NOTE — Interval H&P Note (Signed)
History and Physical Interval Note:  12/27/2017 7:14 AM  Nathaniel [name]  has presented today for surgery, with the diagnosis of RIGHT INGUINAL HERNIA  The various methods of treatment have been discussed with the patient and family. After consideration of risks, benefits and other options for treatment, the patient has consented to  Procedure(s) with comments: [name] (Right) - GENERAL AND TAP BLOCK as a surgical intervention .  The patient's history has been reviewed, patient examined, no change in status, stable for surgery.  I have reviewed the patient's chart and labs.  Questions were answered to the patient's satisfaction.     [place]

## 2017-12-27 NOTE — Op Note (Addendum)
Preoperative diagnosis: Right inguinal hernia  Postoperative diagnosis:direct right inguinal hernia  Procedure: Repair of right inguinal hernia with mesh  Surgeon: Erroll Luna, MD  Anesthesia: General with TAP block and local  Drains: None  EBL: 10 cc  IV fluids: Per anesthesia record  Indications for procedure: The patient presents for repair of a right inguinal hernia that was symptomatic.  Risk, benefits and other options of surgery versus nonoperative management discussed.The risk of hernia repair include bleeding,  Infection,   Recurrence of the hernia,  Mesh use, chronic pain,  Organ injury,  Bowel injury,  Bladder injury,   nerve injury with numbness around the incision,  Death,  and worsening of preexisting  medical problems.  The alternatives to surgery have been discussed as well..  Long term expectations of both operative and non operative treatments have been discussed.   The patient agrees to proceed.   Description of procedure: The patient was seen in the holding area.  The right side was marked as the correct side and questions were answered.  The procedure was reviewed as well as the use of mesh.  Block was placed by anesthesia in the holding area.  He was taken back to the operative room.  After induction of general anesthesia the right inguinal region was prepped and draped in sterile fashion.  Timeout was done.  Transverse incision was made in the right anal crease after infiltration with 0.25% Sensorcaine local.  Dissection was carried down through Scarpa's fascia into the aponeurosis of the external oblique was identified.  The fibers were opened in the direction of the fibers through the external ring.  The cord structures were dissected off a large direct hernia sac.  Half inch Penrose drain was used for retraction of this.  The ilioinguinal nerve was running with the cord was preserved.  The direct sac was dissected back into the preperitoneal space.  A large ultra pro  hernia system mesh was used.  Inner leaflet was placed into the direct defect and deployed into the preperitoneal space without difficulty.  A slit was cut for the cord structures to exit the mesh.  It was secured to the pubic tubercle, shelving edge of the inguinal ligament and to the conjoined tendon with #1 Novafil suture.  The mesh was carefully closed around the cord structure with #1 Novafil.  There is room for the tip of my fifth digit to be inserted into the new internal ring.  This laid nicely.  The cord as well as the ileal nerve were preserved.  The iliohypogastric nerve was preserved.  There is no tension on the mesh.  The external oblique was closed with 2-0 Vicryl with care taken not to compress the cord.  There is ample room for my finger to fit under the external oblique.  Scarpa's fascia was approximated with 3-0 Vicryl and 4-0 Monocryl was used to close the skin in a subcu fashion.  Dermabond applied.  All final counts found to be correct.  Patient was awoke extubated taken to recovery in satisfactory condition.

## 2017-12-28 ENCOUNTER — Encounter (HOSPITAL_COMMUNITY): Payer: Self-pay | Admitting: Surgery

## 2017-12-29 ENCOUNTER — Other Ambulatory Visit: Payer: Self-pay | Admitting: Cardiovascular Disease

## 2018-01-12 LAB — HM DIABETES FOOT EXAM

## 2018-01-17 ENCOUNTER — Encounter: Payer: Self-pay | Admitting: Internal Medicine

## 2018-02-06 ENCOUNTER — Other Ambulatory Visit: Payer: Self-pay | Admitting: *Deleted

## 2018-02-06 MED ORDER — NITROGLYCERIN 0.4 MG SL SUBL: 0.4000 mg | SUBLINGUAL_TABLET | SUBLINGUAL | 6 refills | Status: AC | PRN

## 2018-02-07 ENCOUNTER — Encounter

## 2018-02-14 ENCOUNTER — Telehealth: Payer: Self-pay | Admitting: Internal Medicine

## 2018-02-14 ENCOUNTER — Encounter: Payer: Self-pay | Admitting: Internal Medicine

## 2018-02-14 ENCOUNTER — Ambulatory Visit (INDEPENDENT_AMBULATORY_CARE_PROVIDER_SITE_OTHER): Payer: No Typology Code available for payment source | Admitting: Internal Medicine

## 2018-02-14 VITALS — BP 132/80 | HR 54 | Temp 97.7°F | Resp 16 | Ht 68.0 in | Wt 193.1 lb

## 2018-02-14 DIAGNOSIS — Z0000 Encounter for general adult medical examination without abnormal findings: Secondary | ICD-10-CM

## 2018-02-14 DIAGNOSIS — Z23 Encounter for immunization: Secondary | ICD-10-CM

## 2018-02-14 LAB — BASIC METABOLIC PANEL
BUN: 19 mg/dL (ref 6–23)
CALCIUM: 9.2 mg/dL (ref 8.4–10.5)
CO2: 26 mEq/L (ref 19–32)
CREATININE: 1 mg/dL (ref 0.40–1.50)
Chloride: 104 mEq/L (ref 96–112)
GFR: 81.68 mL/min (ref >60.00)
Glucose, Bld: 180 mg/dL — ABNORMAL HIGH (ref 70–99)
POTASSIUM: 5.1 meq/L (ref 3.5–5.1)
Sodium: 135 mEq/L (ref 135–145)

## 2018-02-14 MED ORDER — PANTOPRAZOLE SODIUM 40 MG PO TBEC: 40.0000 mg | DELAYED_RELEASE_TABLET | Freq: Every day | ORAL | 6 refills | Status: DC

## 2018-02-14 MED ORDER — CROMOLYN SODIUM 4 % OP SOLN: 1.0000 [drp] | Freq: Three times a day (TID) | OPHTHALMIC | 12 refills | Status: DC

## 2018-02-14 NOTE — Assessment & Plan Note (Signed)
Colitis: History of biopsy-proven microscopic colitis, last visit with GI 2018, currently on Entocort as needed, problem silent at this point. GERD, dysphagia: Ongoing but stable symptoms, had a EGD 2018, got dilated, symptoms did not improve.  He is reluctant to have another EGD. Recommend Protonix daily for now. CAD: Last visit with cardiology 10/2017, pt was stable, Plavix discontinued.  Follow-up in 1 year Prostate ca: f/u at [facility], last OV 11-2017, c/o ED-incont-nocturia  Cluster HA: verapamil prn, asx x a while  RTC 1 year

## 2018-02-14 NOTE — Telephone Encounter (Signed)
Copied from [place] [id number]. Topic: Quick Communication - Rx Refill/Question >> Feb 14, 2018  3:22 PM Marin Olp L wrote: Medication: protonix (Dr Larose Kells forgot to call this in after visit today)  Has the patient contacted their pharmacy? Yes.   (Agent: If no, request that the patient contact the pharmacy for the refill.) (Agent: If yes, when and what did the pharmacy advise?)  Preferred Pharmacy (with phone number or street name): [id number] [address], [place] [address] [place] [address] Phone: [phone] Fax: [phone]  Agent: Please be advised that RX refills may take up to 3 business days. We ask that you follow-up with your pharmacy.

## 2018-02-14 NOTE — Assessment & Plan Note (Signed)
-  Td 2012; pnm 23: 2015; pnm 13 today; s/p  flu shot  S/p  zostavax per pt ; shingrix #1 today, next 2 months   -[facility]S:  Cscope 09-2010: Tubular adenoma, no high-grade dysplasia.  Cscope 05-16-14: No polyps, BX microscopic colitis. Next colonoscopy per GI - h/o prostate ca -Diet. Exercise: discussed   -Available labs reviewed, check a BMP

## 2018-02-14 NOTE — Telephone Encounter (Signed)
My apologies, I forgot to send the prescription, please send Protonix 40 mg 1 p.o. daily before breakfast # 49-month supply.

## 2018-02-14 NOTE — Progress Notes (Signed)
Subjective:    Patient ID: Nathaniel [name], male    DOB: December 21, 1960, 58 y.o.   MRN: [id number]  DOS:  02/14/2018 Type of visit - description: cpx He has some symptoms & concerns, see below   Review of Systems [place] having allergies mostly watery eyes, worse on the left.  Occasional sneezing.  No runny nose. Long history of dysphagia and occasional odynophagia.  Symptoms are ongoing but not getting worse.  Only to solids. Random heartburn described as acid in the chest. GU: Has symptoms after prostatectomy follow-up by urology   Other than above, a 14 point review of systems is negative    Past Medical History:  Diagnosis Date  . CAD (coronary artery disease)    a. ant stemi 04/2011 - totalled LAD treated w/ thrombectomy & 3.5 x 16 Promus DES.  OTW nonobs dzs.  EF 45%  . Chronic headaches    cluster  . Depression    has taken meds for depression before  . Diabetes mellitus    dx at age 78, not overweight was told he was type 1  . Hyperlipemia   . Ischemic cardiomyopathy    a. EF 45% by V-gram 04/2011;  b. Echo 04/19/2011 EF 55%, Gr 1 DD.  Marland Kitchen Myocardial infarction ([place])   . PONV (postoperative nausea and vomiting)    N&V following prostate surgery 05/2017  . Prostate cancer ([place])   . Sleep apnea    has CPAP does not use  . Tobacco abuse    reports he quit after STEMI    Past Surgical History:  Procedure Laterality Date  . Cardiac cath with PCI  04/17/2011   DES to LAD  . COLONOSCOPY WITH PROPOFOL N/A 05/16/2014   Procedure: COLONOSCOPY WITH PROPOFOL;  Surgeon: [name], MD;  Location: [facility];  Service: Endoscopy;  Laterality: N/A;  . INGUINAL HERNIA REPAIR Right 12/27/2017   Procedure: RIGHT INGUINAL HERNIA REPAIR WITH MESH;  Surgeon: Erroll Luna, MD;  Location: [facility];  Service: General;  Laterality: Right;  GENERAL AND TAP BLOCK  . LEFT HEART CATHETERIZATION WITH CORONARY ANGIOGRAM N/A 04/17/2011   Procedure: LEFT HEART  CATHETERIZATION WITH CORONARY ANGIOGRAM;  Surgeon: Burnell Blanks, MD;  Location: [facility] CATH LAB;  Service: Cardiovascular;  Laterality: N/A;  . PERCUTANEOUS CORONARY STENT INTERVENTION (PCI-S)  04/17/2011   Procedure: PERCUTANEOUS CORONARY STENT INTERVENTION (PCI-S);  Surgeon: Burnell Blanks, MD;  Location: [facility] C[facility] LAB;  Service: Cardiovascular;;  . PROSTATECTOMY  05/2017  . TONSILLECTOMY    . VASECTOMY      Social History   Socioeconomic History  . Marital status: Married    Spouse name: Not on file  . Number of children: 1  . Years of education: Not on file  . Highest education level: Not on file  Occupational History  . Occupation: PRICE ANALYST    Employer: [employer]    Comment: [employer]  Social Needs  . Financial resource strain: Not on file  . Food insecurity:    Worry: Not on file    Inability: Not on file  . Transportation needs:    Medical: Not on file    Non-medical: Not on file  Tobacco Use  . Smoking status: Former Smoker    Packs/day: 0.50    Years: 15.00    Pack years: 7.50    Types: Cigarettes    Last attempt to quit: 04/17/2011    Years since quitting:  6.8  . Smokeless tobacco: Never Used  . Tobacco comment: smoked from age 71 to 85, < 1/2 PPD  Substance and Sexual Activity  . Alcohol use: Yes    Alcohol/week: 5.0 - 6.0 standard drinks    Types: 5 - 6 Glasses of wine per week    Comment: 2 glasses of wine most nights  . Drug use: No  . Sexual activity: Not Currently  Lifestyle  . Physical activity:    Days per week: Not on file    Minutes per session: Not on file  . Stress: Not on file  Relationships  . Social connections:    Talks on phone: Not on file    Gets together: Not on file    Attends religious service: Not on file    Active member of club or organization: Not on file    Attends meetings of clubs or organizations: Not on file    Relationship status: Not on file  . Intimate partner violence:    Fear of  current or ex partner: Not on file    Emotionally abused: Not on file    Physically abused: Not on file    Forced sexual activity: Not on file  Other Topics Concern  . Not on file  Social History Narrative   Moved from [place] on July 2010    Household- pt and wife     Family History  Problem Relation Age of Onset  . Multiple sclerosis Mother   . Parkinsonism Father   . Stroke Father   . Coronary artery disease Neg Hx   . Hypertension Neg Hx   . Diabetes Neg Hx   . Colon cancer Neg Hx   . Prostate cancer Neg Hx   . Stomach cancer Neg Hx      Allergies as of 02/14/2018      Reactions   Ciprofloxacin Other (See Comments)   Unknown      Medication List       Accurate as of February 14, 2018  5:38 PM. Always use your most recent med list.        aspirin EC 81 MG tablet Take 81 mg by mouth every morning.   atorvastatin 20 MG tablet Commonly known as:  LIPITOR TAKE 1 TABLET BY MOUTH DAILY. PLEASE KEEP UPCOMING APPT IN SEPTEMBER FOR FUTURE REFILLS   BASAGLAR KWIKPEN 100 UNIT/ML Sopn Inject 20-22 Units into the skin See admin instructions. Use 20-22 units SQ as directed ONLY if insulin pump fails   budesonide 3 MG 24 hr capsule Commonly known as:  ENTOCORT EC Take 3 mg by mouth daily as needed (for microscopic colitis).   carvedilol 3.125 MG tablet Commonly known as:  COREG TAKE 1 TABLET BY MOUTH 2 TIMES DAILY WITH A MEAL.   cromolyn 4 % ophthalmic solution Commonly known as:  OPTICROM Place 1 drop into both eyes 3 (three) times daily.   GLUCAGON EMERGENCY 1 MG injection Generic drug:  glucagon Inject 1 mg into the muscle once as needed (for low BS).   ibuprofen 800 MG tablet Commonly known as:  ADVIL,MOTRIN Take 1 tablet (800 mg total) by mouth every 8 (eight) hours as needed.   lisinopril 2.5 MG tablet Commonly known as:  PRINIVIL,ZESTRIL TAKE 1/2 TABLET (1.25MG ) INTHE MORNING   nitroGLYCERIN 0.4 MG SL tablet Commonly known as:  NITROSTAT Place 1 tablet  (0.4 mg total) under the tongue every 5 (five) minutes as needed for chest pain.   NOVOLOG 100 UNIT/ML  injection Generic drug:  insulin aspart See admin instructions. Use as directed via insulin pump   pantoprazole 40 MG tablet Commonly known as:  PROTONIX Take 1 tablet (40 mg total) by mouth daily before breakfast.   tadalafil 20 MG tablet Commonly known as:  ADCIRCA/CIALIS Take 10 mg by mouth 3 (three) times a week.   verapamil 120 MG tablet Commonly known as:  CALAN Take 120 mg by mouth daily as needed (for cluster headaches).           Objective:   Physical Exam BP 132/80 (BP Location: Left Arm, Patient Position: Sitting, Cuff Size: Normal)   Pulse (!) 54   Temp 97.7 F (36.5 C) (Oral)   Resp 16   Ht 5\' 8"  (1.727 m)   Wt 193 lb 2 oz (87.6 kg)   SpO2 96%   BMI 29.36 kg/m  General: Well developed, NAD, BMI noted Neck: No  thyromegaly  HEENT:  Normocephalic . Face symmetric, atraumatic. Eyes are slightly watery, EOMI, pupils equal and reactive Lungs:  CTA B Normal respiratory effort, no intercostal retractions, no accessory muscle use. Heart: RRR,  no murmur.  No pretibial edema bilaterally  Abdomen:  Not distended, soft, non-tender. No rebound or rigidity.   Skin: Exposed areas without rash. Not pale. Not jaundice Neurologic:  alert & oriented X3.  Speech normal, gait appropriate for age and unassisted Strength symmetric and appropriate for age.  Psych: Cognition and judgment appear intact.  Cooperative with normal attention span and concentration.  Behavior appropriate. No anxious or depressed appearing.     Assessment    ASSESSMENT DM--endocrinology, Dr Buddy Duty Hyperlipidemia CAD, sees cards  "STEMI March 2013 : occlusion of the ostial LAD. There was mild plaque in the circumflex and RCA. A Promus DES was placed in the ostial LAD. 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 GERD, dysphagia: Ongoing but stable symptoms, had a EGD 2018, got dilated, symptoms did not improve.  He is reluctant to have another EGD. Recommend Protonix daily for now. CAD: Last visit with cardiology 10/2017, pt was stable, Plavix discontinued.  Follow-up in 1 year Prostate ca: f/u at [facility], last OV 11-2017, c/o ED-incont-nocturia  Cluster HA: verapamil prn, asx x a while  RTC 1 year

## 2018-02-14 NOTE — Telephone Encounter (Signed)
Rx sent 

## 2018-02-14 NOTE — Patient Instructions (Signed)
GO TO THE LAB : Get the blood work     GO TO THE FRONT DESK Schedule your next appointment for a physical exam in 1 year Schedule a nurse visit 2 months from today to get your second Shingrix   Allergies: Start eyedrops, will take several days to start working. Please see your eye doctor  Start Protonix 1 tablet before breakfast to help with heartburn. If not gradually better let me know

## 2018-02-14 NOTE — Telephone Encounter (Signed)
Not on med list. Please advise.

## 2018-02-14 NOTE — Progress Notes (Signed)
Pre visit review using our clinic review tool, if applicable. No additional management support is needed unless otherwise documented below in the visit note. 

## 2018-03-11 ENCOUNTER — Other Ambulatory Visit: Payer: Self-pay | Admitting: Internal Medicine

## 2018-03-11 ENCOUNTER — Other Ambulatory Visit: Payer: Self-pay | Admitting: Cardiovascular Disease

## 2018-04-11 ENCOUNTER — Other Ambulatory Visit: Payer: Self-pay | Admitting: Internal Medicine

## 2018-04-18 ENCOUNTER — Ambulatory Visit (INDEPENDENT_AMBULATORY_CARE_PROVIDER_SITE_OTHER): Payer: No Typology Code available for payment source

## 2018-04-18 DIAGNOSIS — Z23 Encounter for immunization: Secondary | ICD-10-CM

## 2018-04-18 NOTE — Progress Notes (Addendum)
Patient here today for shingrix vaccination.VIS given. 0.45mL shignrix given in left deltoid IM. Pt tolerated well.    Patient also would like to know if she should be taking protonix, long term? He did not give me explanation on why he was asking this question other than he read somewhere that it was a medication that was not suppose to be taken long term. Please advise.   Kathlene November, MD

## 2018-04-19 DIAGNOSIS — Z23 Encounter for immunization: Secondary | ICD-10-CM | POA: Diagnosis not present

## 2018-07-19 LAB — PSA: PSA: 0.05

## 2018-08-10 ENCOUNTER — Encounter: Payer: Self-pay | Admitting: Internal Medicine

## 2018-08-24 ENCOUNTER — Other Ambulatory Visit: Payer: Self-pay

## 2018-08-24 ENCOUNTER — Ambulatory Visit: Payer: Self-pay | Admitting: Internal Medicine

## 2018-08-24 DIAGNOSIS — Z20822 Contact with and (suspected) exposure to covid-19: Secondary | ICD-10-CM

## 2018-08-24 NOTE — Telephone Encounter (Signed)
I returned pt's call.   He is c/o "terrible diarrhea that hit me suddenly this morning".   I feel very weak and tired.  See triage notes.  I called Dr. Ethel Rana office to get him scheduled for a virtual visit (COVID-19 pandemic) however they did not have any openings.   They recommended he go to the urgent care. I let the pt know this and he did not want to go to an urgent care or ED now.   I'd rather stay home and just keep hydrated.   I let him know if he becomes weaker or continues to have the diarrhea to please go on to either an urgent care or ED.   He agreed to this plan.  I also instructed him to call us back if he is still having diarrhea and feeling very tired and weak first thing in the morning  He also agreed to this plan.      Reason for Disposition . [1] SEVERE diarrhea (e.g., 7 or more times / day more than normal) AND [2] age > 60 years  Answer Assessment - Initial Assessment Questions 1. DIARRHEA SEVERITY: "How bad is the diarrhea?" "How many extra stools have you had in the past 24 hours than normal?"    - NO DIARRHEA (SCALE 0)   - MILD (SCALE 1-3): Few loose or mushy BMs; increase of 1-3 stools over normal daily number of stools; mild increase in ostomy output.   -  MODERATE (SCALE 4-7): Increase of 4-6 stools daily over normal; moderate increase in ostomy output. * SEVERE (SCALE 8-10; OR 'WORST POSSIBLE'): Increase of 7 or more stools daily over normal; moderate increase in ostomy output; incontinence.     This morning started suddenly.   I'm very weak and tired. 2. ONSET: "When did the diarrhea begin?"      This morning it hit suddenly.   I felt fine yesterday. 3. BM CONSISTENCY: "How loose or watery is the diarrhea?"      watery 4. VOMITING: "Are you also vomiting?" If so, ask: "How many times in the past 24 hours?"      No nauseas 5. ABDOMINAL PAIN: "Are you having any abdominal pain?" If yes: "What does it feel like?" (e.g., crampy, dull, intermittent, constant)   No 6. ABDOMINAL PAIN SEVERITY: If present, ask: "How bad is the pain?"  (e.g., Scale 1-10; mild, moderate, or severe)   - MILD (1-3): doesn't interfere with normal activities, abdomen soft and not tender to touch    - MODERATE (4-7): interferes with normal activities or awakens from sleep, tender to touch    - SEVERE (8-10): excruciating pain, doubled over, unable to do any normal activities       None 7. ORAL INTAKE: If vomiting, "Have you been able to drink liquids?" "How much fluids have you had in the past 24 hours?"     Yes drinking fluid 8. HYDRATION: "Any signs of dehydration?" (e.g., dry mouth [not just dry lips], too weak to stand, dizziness, new weight loss) "When did you last urinate?"     No dizzy   I'm very tired and weak. 9. EXPOSURE: "Have you traveled to a foreign country recently?" "Have you been exposed to anyone with diarrhea?" "Could you have eaten any food that was spoiled?"     No 10. ANTIBIOTIC USE: "Are you taking antibiotics now or have you taken antibiotics in the past 2 months?"       No 11. OTHER SYMPTOMS: "Do  you have any other symptoms?" (e.g., fever, blood in stool)       No fever  12. PREGNANCY: "Is there any chance you are pregnant?" "When was your last menstrual period?"       N/A  Protocols used: DIARRHEA-A-AH

## 2018-08-29 LAB — NOVEL CORONAVIRUS, NAA: SARS-CoV-2, NAA: NOT DETECTED

## 2018-09-16 ENCOUNTER — Other Ambulatory Visit: Payer: Self-pay | Admitting: Cardiovascular Disease

## 2018-09-18 ENCOUNTER — Telehealth: Payer: Self-pay | Admitting: Cardiovascular Disease

## 2018-09-18 NOTE — Telephone Encounter (Signed)
Left message to call back  

## 2018-09-18 NOTE — Telephone Encounter (Signed)
New Message   Patient has an appointment scheduled on 12/22/18 at 4:20 pm and patient's wife would like to know if he would be having blood work done on that day or before the visit. No active request for blood work in the system. Please advise.

## 2018-09-19 ENCOUNTER — Telehealth: Payer: Self-pay | Admitting: Gastroenterology

## 2018-09-19 MED ORDER — DICYCLOMINE HCL 20 MG PO TABS: 20.0000 mg | ORAL_TABLET | Freq: Three times a day (TID) | ORAL | 0 refills | Status: DC

## 2018-09-19 NOTE — Telephone Encounter (Signed)
Stay on 9 mg budesonide  Dicyclomine 20 mg ac/hs # 120 no refill  Arrange f/u Dr. Ardis Hughs  Sounds like trend is towards improvement - would reassure but tell him if this does not relieve the cramps to call back

## 2018-09-19 NOTE — Telephone Encounter (Signed)
The pt was advised and prescription sent to the pharmacy.  He will call if cramps do not resolve.  He states he needs to call back tomorrow to make a follow up with Dr Ardis Hughs when he has his schedule.

## 2018-09-19 NOTE — Telephone Encounter (Signed)
The pt is calling with episodes of watery diarrhea for 2-3 days about 4 episodes daily. He has a history of microscopic colitis.   Began 3 mg Budesonide 1 week ago increasing to Budesonide 9 mg per day. (9 mg began yesterday)  Has lower abd pain/cramping off/on.  Diarrhea has resolved but continues to have cramping with eating.  No fever, no nausea or vomiting. Pt was advised to begin clear liquids and continue budesonide. Dr Carlean Purl can you please review for further recommendations?  Dr Ardis Hughs is off this week.  Thank you

## 2018-09-19 NOTE — Telephone Encounter (Signed)
Left message on machine to call back  

## 2018-09-25 NOTE — Telephone Encounter (Signed)
Lipids usually followed in primary care. Last checked in May 2019. I spoke with pt and he would like to limit trips into the office.  He is diabetic so it is difficult for him to fast all day.  He will try to eat lightly during the day while watching his blood sugar and have lab work done when here for 4:20 appointment on November 13

## 2018-10-17 ENCOUNTER — Telehealth: Payer: Self-pay

## 2018-10-17 NOTE — Telephone Encounter (Signed)
Called patient and left voicemail for patient to call back to schedule follow up appointment with Dr Ardis Hughs.

## 2018-10-23 LAB — PSA: PSA: 0.059

## 2018-11-08 ENCOUNTER — Encounter: Payer: Self-pay | Admitting: Internal Medicine

## 2018-12-12 ENCOUNTER — Other Ambulatory Visit: Payer: Self-pay | Admitting: Cardiovascular Disease

## 2018-12-22 ENCOUNTER — Ambulatory Visit: Payer: No Typology Code available for payment source | Admitting: Cardiovascular Disease

## 2019-01-23 ENCOUNTER — Other Ambulatory Visit: Payer: Self-pay | Admitting: Cardiovascular Disease

## 2019-02-10 ENCOUNTER — Other Ambulatory Visit: Payer: Self-pay | Admitting: Cardiovascular Disease

## 2019-02-14 NOTE — Progress Notes (Signed)
Chief Complaint  Patient presents with  . Follow-up    CAD   History of Present Illness: 59 yo male with history of DM, HTN, OSA, tobacco abuse, OSA, prostate cancer and CAD with anterior STEMI March 2013 who is here today for cardiac follow up. He was admitted March 2013 with an anterior STEMI and was found to have occlusion of the ostial LAD. There was mild plaque in the circumflex and RCA. A Promus DES was placed in the ostial LAD. Repeat cardiac cath in October 2013 in the setting of recurrent chest pain showed a patent stent in the LAD, mild plaque in the LAD beyond the stent. Echo October 2018 with LVEF=50-55% with distal lateral and apical hypokinesis. No valve disease. He had issues with urinary retention when I saw him last in September 2018. He was seen in urology and found to have prostate cancer.   He is here today for follow up. The patient denies any chest pain, dyspnea, palpitations, lower extremity edema, orthopnea, PND, dizziness, near syncope or syncope.    Primary Care Physician: Colon Branch, MD  Past Medical History:  Diagnosis Date  . CAD (coronary artery disease)    a. ant stemi 04/2011 - totalled LAD treated w/ thrombectomy & 3.5 x 16 Promus DES.  OTW nonobs dzs.  EF 45%  . Chronic headaches    cluster  . Depression    has taken meds for depression before  . Diabetes mellitus    dx at age 5, not overweight was told he was type 1  . Hyperlipemia   . Ischemic cardiomyopathy    a. EF 45% by V-gram 04/2011;  b. Echo 04/19/2011 EF 55%, Gr 1 DD.  Marland Kitchen Myocardial infarction ([place])   . PONV (postoperative nausea and vomiting)    N&V following prostate surgery 05/2017  . Prostate cancer ([place])   . Sleep apnea    has CPAP does not use  . Tobacco abuse    reports he quit after STEMI    Past Surgical History:  Procedure Laterality Date  . Cardiac cath with PCI  04/17/2011   DES to LAD  . COLONOSCOPY WITH PROPOFOL N/A 05/16/2014   Procedure: COLONOSCOPY WITH PROPOFOL;   Surgeon: [name], MD;  Location: [facility];  Service: Endoscopy;  Laterality: N/A;  . INGUINAL HERNIA REPAIR Right 12/27/2017   Procedure: RIGHT INGUINAL HERNIA REPAIR WITH MESH;  Surgeon: Erroll Luna, MD;  Location: [place];  Service: General;  Laterality: Right;  GENERAL AND TAP BLOCK  . LEFT HEART CATHETERIZATION WITH CORONARY ANGIOGRAM N/A 04/17/2011   Procedure: LEFT HEART CATHETERIZATION WITH CORONARY ANGIOGRAM;  Surgeon: Burnell Blanks, MD;  Location: [facility] CATH LAB;  Service: Cardiovascular;  Laterality: N/A;  . PERCUTANEOUS CORONARY STENT INTERVENTION (PCI-S)  04/17/2011   Procedure: PERCUTANEOUS CORONARY STENT INTERVENTION (PCI-S);  Surgeon: Burnell Blanks, MD;  Location: [facility] CATH LAB;  Service: Cardiovascular;;  . PROSTATECTOMY  05/2017  . TONSILLECTOMY    . VASECTOMY      Current Outpatient Medications  Medication Sig Dispense Refill  . aspirin EC 81 MG tablet Take 81 mg by mouth every morning.     Marland Kitchen atorvastatin (LIPITOR) 20 MG tablet Take 1 tablet (20 mg total) by mouth daily at 6 PM. 90 tablet 0  . budesonide (ENTOCORT EC) 3 MG 24 hr capsule Take 3 mg by mouth daily as needed (for microscopic colitis).     . carvedilol (COREG) 3.125 MG tablet TAKE 1  TABLET BY MOUTH 2 TIMES DAILY WITH A MEAL. 180 tablet 3  . cromolyn (OPTICROM) 4 % ophthalmic solution Place 1 drop into both eyes as needed.    Marland Kitchen GLUCAGON EMERGENCY 1 MG injection Inject 1 mg into the muscle once as needed (for low BS).     . Insulin Glargine (BASAGLAR KWIKPEN) 100 UNIT/ML SOPN Inject 20-22 Units into the skin See admin instructions. Use 20-22 units SQ as directed ONLY if insulin pump fails    . lisinopril (ZESTRIL) 2.5 MG tablet TAKE 1/2 TABLET(1.25MG ) BY MOUTH IN THE MORNING 45 tablet 0  . nitroGLYCERIN (NITROSTAT) 0.4 MG SL tablet Place 1 tablet (0.4 mg total) under the tongue every 5 (five) minutes as needed for chest pain. 25 tablet 6  . NOVOLOG 100 UNIT/ML injection See admin instructions.  Use as directed via insulin pump    . oxybutynin (DITROPAN-XL) 5 MG 24 hr tablet Take 5 mg by mouth at bedtime.    . pantoprazole (PROTONIX) 40 MG tablet Take 1 tablet (40 mg total) by mouth daily before breakfast. 90 tablet 3  . verapamil (CALAN) 120 MG tablet Take 120 mg by mouth daily as needed (for cluster headaches).      No current facility-administered medications for this visit.    Allergies  Allergen Reactions  . Ciprofloxacin Other (See Comments)    Unknown    Social History   Socioeconomic History  . Marital status: Married    Spouse name: Not on file  . Number of children: 1  . Years of education: Not on file  . Highest education level: Not on file  Occupational History  . Occupation: PRICE ANALYST    Employer: [employer]    Comment: [employer]  Tobacco Use  . Smoking status: Former Smoker    Packs/day: 0.50    Years: 15.00    Pack years: 7.50    Types: Cigarettes    Quit date: 04/17/2011    Years since quitting: 7.8  . Smokeless tobacco: Never Used  . Tobacco comment: smoked from age 51 to 83, < 1/2 PPD  Substance and Sexual Activity  . Alcohol use: Yes    Alcohol/week: 5.0 - 6.0 standard drinks    Types: 5 - 6 Glasses of wine per week    Comment: 2 glasses of wine most nights  . Drug use: No  . Sexual activity: Not Currently  Other Topics Concern  . Not on file  Social History Narrative   Moved from [place] on July 2010    Household- pt and wife   Social Determinants of Health   Financial Resource Strain:   . Difficulty of Paying Living Expenses: Not on file  Food Insecurity:   . Worried About Charity fundraiser in the Last Year: Not on file  . Ran Out of Food in the Last Year: Not on file  Transportation Needs:   . Lack of Transportation (Medical): Not on file  . Lack of Transportation (Non-Medical): Not on file  Physical Activity:   . Days of Exercise per Week: Not on file  . Minutes of Exercise per Session: Not on file    Stress:   . Feeling of Stress : Not on file  Social Connections:   . Frequency of Communication with Friends and Family: Not on file  . Frequency of Social Gatherings with Friends and Family: Not on file  . Attends Religious Services: Not on file  . Active Member of Clubs or Organizations:  Not on file  . Attends Archivist Meetings: Not on file  . Marital Status: Not on file  Intimate Partner Violence:   . Fear of Current or Ex-Partner: Not on file  . Emotionally Abused: Not on file  . Physically Abused: Not on file  . Sexually Abused: Not on file    Family History  Problem Relation Age of Onset  . Multiple sclerosis Mother   . Parkinsonism Father   . Stroke Father   . Coronary artery disease Neg Hx   . Hypertension Neg Hx   . Diabetes Neg Hx   . Colon cancer Neg Hx   . Prostate cancer Neg Hx   . Stomach cancer Neg Hx     Review of Systems:  As stated in the HPI and otherwise negative.   BP 120/72   Pulse 67   Ht 5\' 8"  (1.727 m)   Wt 195 lb 3.2 oz (88.5 kg)   SpO2 99%   BMI 29.68 kg/m   Physical Examination:  General: Well developed, well nourished, NAD  HEENT: OP clear, mucus membranes moist  SKIN: warm, dry. No rashes. Neuro: No focal deficits  Musculoskeletal: Muscle strength 5/5 all ext  Psychiatric: Mood and affect normal  Neck: No JVD, no carotid bruits, no thyromegaly, no lymphadenopathy.  Lungs:Clear bilaterally, no wheezes, rhonci, crackles Cardiovascular: Regular rate and rhythm. No murmurs, gallops or rubs. Abdomen:Soft. Bowel sounds present. Non-tender.  Extremities: No lower extremity edema. Pulses are 2 + in the bilateral DP/PT.  Echo October 2018: - Left ventricle: LVEF is normal with distal lateral and apical   hypokinesis. The cavity size was normal. Wall thickness was   normal. Systolic function was normal. The estimated ejection   fraction was in the range of 50% to 55%.  EKG:  EKG is ordered today. The ekg ordered today  demonstrates NSR, rate 63 bpm  Recent Labs: No results found for requested labs within last 8760 hours.   Lipid Panel    Component Value Date/Time   CHOL 146 06/23/2017 0000   TRIG 47 06/23/2017 0000   HDL 60 06/23/2017 0000   CHOLHDL 1.8 10/27/2011 0544   VLDL 10 10/27/2011 0544   LDLCALC 76 06/23/2017 0000   LDLDIRECT 140.0 08/25/2010 1108     Wt Readings from Last 3 Encounters:  02/15/19 195 lb 3.2 oz (88.5 kg)  02/14/18 193 lb 2 oz (87.6 kg)  12/19/17 191 lb 4.8 oz (86.8 kg)     Other studies Reviewed: Additional studies/ records that were reviewed today include: . Review of the above records demonstrates:    Assessment and Plan:   1. CAD without angina:  Last cardiac cath 11/10/11 with patent LAD stent performed following chest pain and abnormal nuclear study. Echo October 2018 with normal LV systolic function. He has no chest pain. Continue ASA, statin, beta blocker and Ace-inh.    2. HTN: BP is controlled. No changes  3. Hyperlipidemia: Lipids followed in primary care. No recent lipids. Will check lipids and LFTs now. Will continue statin.   Current medicines are reviewed at length with the patient today.  The patient does not have concerns regarding medicines.  The following changes have been made:  no change  Labs/ tests ordered today include:   Orders Placed This Encounter  Procedures  . Hepatic function panel  . Lipid Profile  . EKG 12-Lead    Disposition:   FU with me in 12  months  Signed, Lauree Chandler, MD  02/15/2019 10:51 AM    [address], [place], [place]  [address] Phone: [phone]; Fax: [phone]

## 2019-02-15 ENCOUNTER — Encounter: Payer: Self-pay | Admitting: Cardiovascular Disease

## 2019-02-15 ENCOUNTER — Other Ambulatory Visit: Payer: Self-pay

## 2019-02-15 ENCOUNTER — Ambulatory Visit: Payer: No Typology Code available for payment source | Admitting: Cardiovascular Disease

## 2019-02-15 VITALS — BP 120/72 | HR 67 | Ht 68.0 in | Wt 195.2 lb

## 2019-02-15 DIAGNOSIS — E7800 Pure hypercholesterolemia, unspecified: Secondary | ICD-10-CM

## 2019-02-15 DIAGNOSIS — I10 Essential (primary) hypertension: Secondary | ICD-10-CM | POA: Diagnosis not present

## 2019-02-15 DIAGNOSIS — I2510 Atherosclerotic heart disease of native coronary artery without angina pectoris: Secondary | ICD-10-CM

## 2019-02-15 NOTE — Patient Instructions (Signed)
Medication Instructions:  No changes *If you need a refill on your cardiac medications before your next appointment, please call your pharmacy*  Lab Work: Please return for fasting lipid panel and liver function test  If you have labs (blood work) drawn today and your tests are completely normal, you will receive your results only by: Marland Kitchen MyChart Message (if you have MyChart) OR . A paper copy in the mail If you have any lab test that is abnormal or we need to change your treatment, we will call you to review the results.  Testing/Procedures: none  Follow-Up: At [facility], you and your health needs are our priority.  As part of our continuing mission to provide you with exceptional heart care, we have created designated Provider Care Teams.  These Care Teams include your primary Cardiologist (physician) and Advanced Practice Providers (APPs -  Physician Assistants and Nurse Practitioners) who all work together to provide you with the care you need, when you need it.  Your next appointment:   12 month(s)  The format for your next appointment:   Either In Person or Virtual  Provider:   You may see Lauree Chandler, MD or one of the following Advanced Practice Providers on your designated Care Team:    Melina Copa, PA-C  Ermalinda Barrios, PA-C   Other Instructions

## 2019-02-20 ENCOUNTER — Other Ambulatory Visit: Payer: No Typology Code available for payment source

## 2019-03-19 ENCOUNTER — Other Ambulatory Visit: Payer: Self-pay | Admitting: Cardiovascular Disease

## 2019-03-22 ENCOUNTER — Encounter: Payer: Self-pay | Admitting: Internal Medicine

## 2019-04-02 ENCOUNTER — Ambulatory Visit: Payer: Self-pay | Admitting: Surgery

## 2019-04-02 ENCOUNTER — Telehealth: Payer: Self-pay | Admitting: *Deleted

## 2019-04-02 NOTE — Telephone Encounter (Signed)
   [facility] Pre-operative Risk Assessment    Request for surgical clearance:  1. What type of surgery is being performed? HERNIA REPAIR   2. When is this surgery scheduled? TBD   3. What type of clearance is required (medical clearance vs. Pharmacy clearance to hold med vs. Both)? MEDICAL  4. Are there any medications that need to be held prior to surgery and how long? ASA    5. Practice name and name of physician performing surgery? [facility]; DR. Marcello Moores [place]   6. What is your office phone number [phone]    7.   What is your office fax number [phone] ATTN: MICHELLE BROOKS, CMA   8.   Anesthesia type (None, local, MAC, general) ? GENERAL   Nathaniel [name] 04/02/2019, 5:12 PM  _________________________________________________________________   (provider comments below)

## 2019-04-02 NOTE — H&P (Signed)
Nathaniel [name] Documented: 03/26/2019 10:53 AM Location: [facility] Patient #: [id number] DOB: 1960-08-05 Married / Language: Nathaniel [name] / Race: White Male   History of Present Illness Nathaniel [name] A. Nathaniel Abdulla MD; 03/26/2019 12:20 PM) Patient words: Pt returns with cheif complaint of left groin bulge for 1 week noticed in the shower not painful soft Not tender Hx if RIH december 2020.  The patient is a 59 year old male.   Allergies (Nathaniel [name], [name]; 03/26/2019 10:53 AM) Cipro *FLUOROQUINOLONES*  Allergies Reconciled   Medication History (Nathaniel [name], [name]; 03/26/2019 10:54 AM) Lisinopril (2.5MG  Tablet, Oral) Active. NovoLOG (100UNIT/ML Solution, Subcutaneous) Active. Aspirin (81MG  Tablet, Oral) Active. Atorvastatin Calcium (20MG  Tablet, Oral) Active. Carvedilol (3.125MG  Tablet, Oral) Active. Verapamil HCl (120MG  Tablet, Oral) Active. Oxybutynin Chloride ER (5MG  Tablet ER 24HR, Oral) Active. Pantoprazole Sodium (40MG  Tablet DR, Oral) Active. Medications Reconciled  Vitals (Nathaniel [name]; 03/26/2019 10:54 AM) 03/26/2019 10:54 AM Weight: 193.38 lb Height: 68in Body Surface Area: 2.01 m Body Mass Index: 29.4 kg/m  Temp.: 97.45F  Pulse: 66 (Regular)  BP: 118/74 (Sitting, Left Arm, Standard)       Physical Exam (Nathaniel [name] A. Nathaniel Devincenzi MD; 03/26/2019 12:21 PM) General Mental Status-Alert. General Appearance-Consistent with stated age. Hydration-Well hydrated. Voice-Normal.  Abdomen Note: RIH site healed reducible LIH which is new soft NT   Neuropsychiatric The patient's mood and affect are described as -normal. Associations-intact. Judgment and Insight-insight is appropriate concerning matters relevant to self.    Assessment & Plan (Nathaniel [name] A. Nathaniel Buening MD; 03/26/2019 12:22 PM) LEFT INGUINAL HERNIA (K40.90) Impression: will decide on repair timing since wanting a penile prosthesis discussed surgery with him he will reach out  when ready Current Plans You are being scheduled for surgery- Our schedulers will call you.  You should hear from our office's scheduling department within 5 working days about the location, date, and time of surgery. We try to make accommodations for patient's preferences in scheduling surgery, but sometimes the OR schedule or the surgeon's schedule prevents [place] from making those accommodations.  If you have not heard from our office [phone]) in 5 working days, call the office and ask for your surgeon's nurse.  If you have other questions about your diagnosis, plan, or surgery, call the office and ask for your surgeon's nurse.  Pt Education - Pamphlet Given - Hernia Surgery: discussed with patient and provided information. Pt Education - CCS Mesh education: discussed with patient and provided information. The anatomy & physiology of the abdominal wall and pelvic floor was discussed. The pathophysiology of hernias in the inguinal and pelvic region was discussed. Natural history risks such as progressive enlargement, pain, incarceration, and strangulation was discussed. Contributors to complications such as smoking, obesity, diabetes, prior surgery, etc were discussed.  I feel the risks of no intervention will lead to serious problems that outweigh the operative risks; therefore, I recommended surgery to reduce and repair the hernia. I explained need for an open approach. I noted usual use of mesh to patch and/or buttress hernia repair  Risks such as bleeding, infection, abscess, need for further treatment, heart attack, death, and other risks were discussed. I noted a good likelihood this will help address the problem. Goals of post-operative recovery were discussed as well. Possibility that this will not correct all symptoms was explained. I stressed the importance of low-impact activity, aggressive pain control, avoiding constipation, & not pushing through pain to minimize risk  of post-operative chronic pain or injury. Possibility of reherniation was discussed. We will  work to minimize complications.  An educational handout further explaining the pathology & treatment options was given as well. Questions were answered. The patient expresses understanding & wishes to proceed with surgery.

## 2019-04-02 NOTE — H&P (View-Only) (Signed)
Nathaniel [name] Documented: 03/26/2019 10:53 AM Location: [facility] Patient #: [id number] DOB: 1960/03/18 Married / Language: Cleophus Molt / Race: White Male   History of Present Illness Nathaniel Moores A. Troy Kanouse MD; 03/26/2019 12:20 PM) Patient words: Pt returns with cheif complaint of left groin bulge for 1 week noticed in the shower not painful soft Not tender Hx if RIH december 2020.  The patient is a 59 year old male.   Allergies (Chanel Teressa Senter, CMA; 03/26/2019 10:53 AM) Cipro *FLUOROQUINOLONES*  Allergies Reconciled   Medication History (Chanel Teressa Senter, CMA; 03/26/2019 10:54 AM) Lisinopril (2.5MG  Tablet, Oral) Active. NovoLOG (100UNIT/ML Solution, Subcutaneous) Active. Aspirin (81MG  Tablet, Oral) Active. Atorvastatin Calcium (20MG  Tablet, Oral) Active. Carvedilol (3.125MG  Tablet, Oral) Active. Verapamil HCl (120MG  Tablet, Oral) Active. Oxybutynin Chloride ER (5MG  Tablet ER 24HR, Oral) Active. Pantoprazole Sodium (40MG  Tablet DR, Oral) Active. Medications Reconciled  Vitals (Chanel Nolan CMA; 03/26/2019 10:54 AM) 03/26/2019 10:54 AM Weight: 193.38 lb Height: 68in Body Surface Area: 2.01 m Body Mass Index: 29.4 kg/m  Temp.: 97.15F  Pulse: 66 (Regular)  BP: 118/74 (Sitting, Left Arm, Standard)       Physical Exam (Kashae Carstens A. Kamera Dubas MD; 03/26/2019 12:21 PM) General Mental Status-Alert. General Appearance-Consistent with stated age. Hydration-Well hydrated. Voice-Normal.  Abdomen Note: RIH site healed reducible LIH which is new soft NT   Neuropsychiatric The patient's mood and affect are described as -normal. Associations-intact. Judgment and Insight-insight is appropriate concerning matters relevant to self.    Assessment & Plan (Merlina Marchena A. Jeniah Kishi MD; 03/26/2019 12:22 PM) LEFT INGUINAL HERNIA (K40.90) Impression: will decide on repair timing since wanting a penile prosthesis discussed surgery with him he will reach out  when ready Current Plans You are being scheduled for surgery- Our schedulers will call you.  You should hear from our office's scheduling department within 5 working days about the location, date, and time of surgery. We try to make accommodations for patient's preferences in scheduling surgery, but sometimes the OR schedule or the surgeon's schedule prevents [place] from making those accommodations.  If you have not heard from our office [phone]) in 5 working days, call the office and ask for your surgeon's nurse.  If you have other questions about your diagnosis, plan, or surgery, call the office and ask for your surgeon's nurse.  Pt Education - Pamphlet Given - Hernia Surgery: discussed with patient and provided information. Pt Education - CCS Mesh education: discussed with patient and provided information. The anatomy & physiology of the abdominal wall and pelvic floor was discussed. The pathophysiology of hernias in the inguinal and pelvic region was discussed. Natural history risks such as progressive enlargement, pain, incarceration, and strangulation was discussed. Contributors to complications such as smoking, obesity, diabetes, prior surgery, etc were discussed.  I feel the risks of no intervention will lead to serious problems that outweigh the operative risks; therefore, I recommended surgery to reduce and repair the hernia. I explained need for an open approach. I noted usual use of mesh to patch and/or buttress hernia repair  Risks such as bleeding, infection, abscess, need for further treatment, heart attack, death, and other risks were discussed. I noted a good likelihood this will help address the problem. Goals of post-operative recovery were discussed as well. Possibility that this will not correct all symptoms was explained. I stressed the importance of low-impact activity, aggressive pain control, avoiding constipation, & not pushing through pain to minimize risk  of post-operative chronic pain or injury. Possibility of reherniation was discussed. We will  work to minimize complications.  An educational handout further explaining the pathology & treatment options was given as well. Questions were answered. The patient expresses understanding & wishes to proceed with surgery.

## 2019-04-03 NOTE — Telephone Encounter (Signed)
   Primary Cardiologist: Lauree Chandler, MD  Chart reviewed as part of pre-operative protocol coverage. Given past medical history and time since last visit, based on ACC/AHA guidelines, ETZIO SINEX would be at acceptable risk for the planned procedure without further cardiovascular testing.  We would prefer pt remain on Asprin 81 mg but will leave to discretion of the surgeon.   I will route this recommendation to the requesting party via Epic fax function and remove from pre-op pool.  Please call with questions.  Cecilie Kicks, NP 04/03/2019, 9:03 AM

## 2019-04-23 LAB — HEPATIC FUNCTION PANEL
ALT: 21 (ref 10–40)
AST: 24 (ref 14–40)
Alkaline Phosphatase: 68 (ref 25–125)
Bilirubin, Total: 0.5

## 2019-04-23 LAB — MICROALBUMIN, URINE: Microalb, Ur: 0.79

## 2019-04-23 LAB — PSA: PSA: 0.078

## 2019-04-23 LAB — BASIC METABOLIC PANEL
BUN: 22 — AB (ref 4–21)
CO2: 26 — AB (ref 13–22)
Chloride: 106 (ref 99–108)
Creatinine: 1.2 (ref 0.6–1.3)
Glucose: 50
Potassium: 5 (ref 3.4–5.3)
Sodium: 139 (ref 137–147)

## 2019-04-23 LAB — COMPREHENSIVE METABOLIC PANEL
Albumin: 4.6 (ref 3.5–5.0)
Calcium: 9.4 (ref 8.7–10.7)

## 2019-04-23 LAB — HEMOGLOBIN A1C: Hemoglobin A1C: 5.9

## 2019-04-23 LAB — LIPID PANEL
Cholesterol: 164 (ref 0–200)
HDL: 66 (ref 35–70)
LDL Cholesterol: 85
Triglycerides: 66 (ref 40–160)

## 2019-04-23 LAB — TSH: TSH: 1.07 (ref 0.41–5.90)

## 2019-04-23 LAB — HM DIABETES FOOT EXAM: HM Diabetic Foot Exam: NORMAL

## 2019-04-25 ENCOUNTER — Encounter (HOSPITAL_BASED_OUTPATIENT_CLINIC_OR_DEPARTMENT_OTHER): Payer: Self-pay | Admitting: Surgery

## 2019-04-30 ENCOUNTER — Other Ambulatory Visit (HOSPITAL_COMMUNITY)
Admission: RE | Admit: 2019-04-30 | Discharge: 2019-04-30 | Disposition: A | Discharge status: Home / Self Care | Payer: No Typology Code available for payment source | Source: Ambulatory Visit | Attending: Surgery | Admitting: Surgery

## 2019-04-30 ENCOUNTER — Encounter (HOSPITAL_BASED_OUTPATIENT_CLINIC_OR_DEPARTMENT_OTHER)
Admission: RE | Admit: 2019-04-30 | Discharge: 2019-04-30 | Disposition: A | Discharge status: Home / Self Care | Payer: No Typology Code available for payment source | Source: Ambulatory Visit | Attending: Surgery | Admitting: Surgery

## 2019-04-30 DIAGNOSIS — E119 Type 2 diabetes mellitus without complications: Secondary | ICD-10-CM | POA: Diagnosis not present

## 2019-04-30 DIAGNOSIS — I252 Old myocardial infarction: Secondary | ICD-10-CM | POA: Diagnosis not present

## 2019-04-30 DIAGNOSIS — Z01812 Encounter for preprocedural laboratory examination: Secondary | ICD-10-CM | POA: Diagnosis not present

## 2019-04-30 DIAGNOSIS — K4090 Unilateral inguinal hernia, without obstruction or gangrene, not specified as recurrent: Secondary | ICD-10-CM | POA: Diagnosis not present

## 2019-04-30 DIAGNOSIS — G4730 Sleep apnea, unspecified: Secondary | ICD-10-CM | POA: Diagnosis not present

## 2019-04-30 DIAGNOSIS — Z955 Presence of coronary angioplasty implant and graft: Secondary | ICD-10-CM | POA: Diagnosis not present

## 2019-04-30 DIAGNOSIS — K219 Gastro-esophageal reflux disease without esophagitis: Secondary | ICD-10-CM | POA: Diagnosis not present

## 2019-04-30 DIAGNOSIS — Z794 Long term (current) use of insulin: Secondary | ICD-10-CM | POA: Diagnosis not present

## 2019-04-30 DIAGNOSIS — Z79899 Other long term (current) drug therapy: Secondary | ICD-10-CM | POA: Diagnosis not present

## 2019-04-30 DIAGNOSIS — Z20822 Contact with and (suspected) exposure to covid-19: Secondary | ICD-10-CM | POA: Diagnosis not present

## 2019-04-30 DIAGNOSIS — I2510 Atherosclerotic heart disease of native coronary artery without angina pectoris: Secondary | ICD-10-CM | POA: Diagnosis not present

## 2019-04-30 DIAGNOSIS — Z87891 Personal history of nicotine dependence: Secondary | ICD-10-CM | POA: Diagnosis not present

## 2019-04-30 DIAGNOSIS — Z7982 Long term (current) use of aspirin: Secondary | ICD-10-CM | POA: Diagnosis not present

## 2019-04-30 LAB — COMPREHENSIVE METABOLIC PANEL
ALT: 21 U/L (ref 0–44)
AST: 27 U/L (ref 15–41)
Albumin: 4 g/dL (ref 3.5–5.0)
Alkaline Phosphatase: 69 U/L (ref 38–126)
Anion gap: 7 (ref 5–15)
BUN: 19 mg/dL (ref 6–20)
CO2: 23 mmol/L (ref 22–32)
Calcium: 9.1 mg/dL (ref 8.9–10.3)
Chloride: 108 mmol/L (ref 98–111)
Creatinine, Ser: 1.08 mg/dL (ref 0.61–1.24)
GFR calc Af Amer: >60 mL/min (ref >60)
GFR calc non Af Amer: >60 mL/min (ref >60)
Glucose, Bld: 118 mg/dL — ABNORMAL HIGH (ref 70–99)
Potassium: 5.2 mmol/L — ABNORMAL HIGH (ref 3.5–5.1)
Sodium: 138 mmol/L (ref 135–145)
Total Bilirubin: 1 mg/dL (ref 0.3–1.2)
Total Protein: 7 g/dL (ref 6.5–8.1)

## 2019-04-30 LAB — CBC WITH DIFFERENTIAL/PLATELET
Abs Immature Granulocytes: 0 10*3/uL (ref 0.00–0.07)
Basophils Absolute: 0 10*3/uL (ref 0.0–0.1)
Basophils Relative: 1 %
Eosinophils Absolute: 0 10*3/uL (ref 0.0–0.5)
Eosinophils Relative: 0 %
HCT: 39.7 % (ref 39.0–52.0)
Hemoglobin: 13.5 g/dL (ref 13.0–17.0)
Immature Granulocytes: 0 %
Lymphocytes Relative: 22 %
Lymphs Abs: 1 10*3/uL (ref 0.7–4.0)
MCH: 32.6 pg (ref 26.0–34.0)
MCHC: 34 g/dL (ref 30.0–36.0)
MCV: 95.9 fL (ref 80.0–100.0)
Monocytes Absolute: 0.5 10*3/uL (ref 0.1–1.0)
Monocytes Relative: 11 %
Neutro Abs: 3 10*3/uL (ref 1.7–7.7)
Neutrophils Relative %: 66 %
Platelets: 222 10*3/uL (ref 150–400)
RBC: 4.14 MIL/uL — ABNORMAL LOW (ref 4.22–5.81)
RDW: 12.6 % (ref 11.5–15.5)
WBC: 4.6 10*3/uL (ref 4.0–10.5)
nRBC: 0 % (ref 0.0–0.2)

## 2019-04-30 LAB — SARS CORONAVIRUS 2 (TAT 6-24 HRS): SARS Coronavirus 2: NEGATIVE

## 2019-04-30 NOTE — Progress Notes (Signed)

## 2019-04-30 NOTE — Progress Notes (Signed)
K+5.2, will recheck dos per Dr. Marcie Bal.

## 2019-05-02 ENCOUNTER — Ambulatory Visit (HOSPITAL_BASED_OUTPATIENT_CLINIC_OR_DEPARTMENT_OTHER)
Admission: RE | Admit: 2019-05-02 | Discharge: 2019-05-02 | Disposition: A | Discharge status: Home / Self Care | Payer: No Typology Code available for payment source | Attending: Surgery | Admitting: Surgery

## 2019-05-02 ENCOUNTER — Ambulatory Visit (HOSPITAL_BASED_OUTPATIENT_CLINIC_OR_DEPARTMENT_OTHER): Payer: No Typology Code available for payment source | Admitting: Anesthesiology

## 2019-05-02 ENCOUNTER — Encounter (HOSPITAL_BASED_OUTPATIENT_CLINIC_OR_DEPARTMENT_OTHER): Payer: Self-pay | Admitting: Surgery

## 2019-05-02 ENCOUNTER — Encounter (HOSPITAL_BASED_OUTPATIENT_CLINIC_OR_DEPARTMENT_OTHER)
Admission: RE | Disposition: A | Discharge status: Home / Self Care | Payer: Self-pay | Source: Home / Self Care | Attending: Surgery

## 2019-05-02 ENCOUNTER — Other Ambulatory Visit: Payer: Self-pay

## 2019-05-02 DIAGNOSIS — K4090 Unilateral inguinal hernia, without obstruction or gangrene, not specified as recurrent: Secondary | ICD-10-CM | POA: Diagnosis not present

## 2019-05-02 DIAGNOSIS — K219 Gastro-esophageal reflux disease without esophagitis: Secondary | ICD-10-CM | POA: Insufficient documentation

## 2019-05-02 DIAGNOSIS — Z955 Presence of coronary angioplasty implant and graft: Secondary | ICD-10-CM | POA: Insufficient documentation

## 2019-05-02 DIAGNOSIS — I2510 Atherosclerotic heart disease of native coronary artery without angina pectoris: Secondary | ICD-10-CM | POA: Insufficient documentation

## 2019-05-02 DIAGNOSIS — E119 Type 2 diabetes mellitus without complications: Secondary | ICD-10-CM | POA: Insufficient documentation

## 2019-05-02 DIAGNOSIS — Z7982 Long term (current) use of aspirin: Secondary | ICD-10-CM | POA: Insufficient documentation

## 2019-05-02 DIAGNOSIS — Z87891 Personal history of nicotine dependence: Secondary | ICD-10-CM | POA: Insufficient documentation

## 2019-05-02 DIAGNOSIS — Z79899 Other long term (current) drug therapy: Secondary | ICD-10-CM | POA: Insufficient documentation

## 2019-05-02 DIAGNOSIS — G4730 Sleep apnea, unspecified: Secondary | ICD-10-CM | POA: Insufficient documentation

## 2019-05-02 DIAGNOSIS — Z794 Long term (current) use of insulin: Secondary | ICD-10-CM | POA: Insufficient documentation

## 2019-05-02 DIAGNOSIS — I252 Old myocardial infarction: Secondary | ICD-10-CM | POA: Insufficient documentation

## 2019-05-02 HISTORY — PX: INGUINAL HERNIA REPAIR: SHX194

## 2019-05-02 HISTORY — DX: Gastro-esophageal reflux disease without esophagitis: K21.9

## 2019-05-02 HISTORY — PX: INSERTION OF MESH: SHX5868

## 2019-05-02 LAB — COMPREHENSIVE METABOLIC PANEL
ALT: 22 U/L (ref 0–44)
AST: 22 U/L (ref 15–41)
Albumin: 4.2 g/dL (ref 3.5–5.0)
Alkaline Phosphatase: 69 U/L (ref 38–126)
Anion gap: 9 (ref 5–15)
BUN: 14 mg/dL (ref 6–20)
CO2: 23 mmol/L (ref 22–32)
Calcium: 9.3 mg/dL (ref 8.9–10.3)
Chloride: 106 mmol/L (ref 98–111)
Creatinine, Ser: 1.11 mg/dL (ref 0.61–1.24)
GFR calc Af Amer: >60 mL/min (ref >60)
GFR calc non Af Amer: >60 mL/min (ref >60)
Glucose, Bld: 139 mg/dL — ABNORMAL HIGH (ref 70–99)
Potassium: 4.5 mmol/L (ref 3.5–5.1)
Sodium: 138 mmol/L (ref 135–145)
Total Bilirubin: 1 mg/dL (ref 0.3–1.2)
Total Protein: 7.2 g/dL (ref 6.5–8.1)

## 2019-05-02 LAB — GLUCOSE, CAPILLARY
Glucose-Capillary: 140 mg/dL — ABNORMAL HIGH (ref 70–99)
Glucose-Capillary: 83 mg/dL (ref 70–99)

## 2019-05-02 SURGERY — REPAIR, HERNIA, INGUINAL, ADULT
Anesthesia: General | Site: Groin | Laterality: Left

## 2019-05-02 MED ORDER — ACETAMINOPHEN 500 MG PO TABS
ORAL_TABLET | ORAL | Status: AC
  Filled 2019-05-02: qty 2

## 2019-05-02 MED ORDER — ROCURONIUM BROMIDE 10 MG/ML (PF) SYRINGE
PREFILLED_SYRINGE | INTRAVENOUS | Status: AC
  Filled 2019-05-02: qty 10

## 2019-05-02 MED ORDER — MIDAZOLAM HCL 2 MG/2ML IJ SOLN
INTRAMUSCULAR | Status: AC
  Filled 2019-05-02: qty 2

## 2019-05-02 MED ORDER — LACTATED RINGERS IV SOLN: INTRAVENOUS | Status: DC

## 2019-05-02 MED ORDER — FENTANYL CITRATE (PF) 100 MCG/2ML IJ SOLN
50.0000 ug | INTRAMUSCULAR | Status: DC | PRN
  Administered 2019-05-02: 100 ug via INTRAVENOUS

## 2019-05-02 MED ORDER — MIDAZOLAM HCL 2 MG/2ML IJ SOLN
1.0000 mg | INTRAMUSCULAR | Status: DC | PRN
  Administered 2019-05-02: 2 mg via INTRAVENOUS

## 2019-05-02 MED ORDER — GABAPENTIN 300 MG PO CAPS
ORAL_CAPSULE | ORAL | Status: AC
  Filled 2019-05-02: qty 1

## 2019-05-02 MED ORDER — ROCURONIUM BROMIDE 100 MG/10ML IV SOLN
INTRAVENOUS | Status: DC | PRN
  Administered 2019-05-02: 50 mg via INTRAVENOUS

## 2019-05-02 MED ORDER — PROPOFOL 10 MG/ML IV BOLUS
INTRAVENOUS | Status: DC | PRN
  Administered 2019-05-02: 200 mg via INTRAVENOUS

## 2019-05-02 MED ORDER — SCOPOLAMINE 1 MG/3DAYS TD PT72
MEDICATED_PATCH | TRANSDERMAL | Status: AC
  Filled 2019-05-02: qty 1

## 2019-05-02 MED ORDER — FENTANYL CITRATE (PF) 100 MCG/2ML IJ SOLN
INTRAMUSCULAR | Status: AC
  Filled 2019-05-02: qty 2

## 2019-05-02 MED ORDER — GLYCOPYRROLATE 0.2 MG/ML IJ SOLN
INTRAMUSCULAR | Status: DC | PRN
  Administered 2019-05-02 (×2): .1 mg via INTRAVENOUS

## 2019-05-02 MED ORDER — DEXAMETHASONE SODIUM PHOSPHATE 4 MG/ML IJ SOLN
INTRAMUSCULAR | Status: DC | PRN
  Administered 2019-05-02: 10 mg via INTRAVENOUS

## 2019-05-02 MED ORDER — GLYCOPYRROLATE PF 0.2 MG/ML IJ SOSY
PREFILLED_SYRINGE | INTRAMUSCULAR | Status: AC
  Filled 2019-05-02: qty 1

## 2019-05-02 MED ORDER — ACETAMINOPHEN 10 MG/ML IV SOLN: 1000.0000 mg | Freq: Once | INTRAVENOUS | Status: DC | PRN

## 2019-05-02 MED ORDER — ACETAMINOPHEN 325 MG PO TABS: 325.0000 mg | ORAL_TABLET | Freq: Once | ORAL | Status: DC | PRN

## 2019-05-02 MED ORDER — PROMETHAZINE HCL 25 MG/ML IJ SOLN: 6.2500 mg | INTRAMUSCULAR | Status: DC | PRN

## 2019-05-02 MED ORDER — LIDOCAINE 2% (20 MG/ML) 5 ML SYRINGE
INTRAMUSCULAR | Status: AC
  Filled 2019-05-02: qty 5

## 2019-05-02 MED ORDER — CHLORHEXIDINE GLUCONATE CLOTH 2 % EX PADS: 6.0000 | MEDICATED_PAD | Freq: Once | CUTANEOUS | Status: DC

## 2019-05-02 MED ORDER — BUPIVACAINE HCL (PF) 0.25 % IJ SOLN
INTRAMUSCULAR | Status: AC
  Filled 2019-05-02: qty 30

## 2019-05-02 MED ORDER — ONDANSETRON HCL 4 MG/2ML IJ SOLN
INTRAMUSCULAR | Status: AC
  Filled 2019-05-02: qty 2

## 2019-05-02 MED ORDER — FENTANYL CITRATE (PF) 100 MCG/2ML IJ SOLN
INTRAMUSCULAR | Status: DC | PRN
  Administered 2019-05-02: 100 ug via INTRAVENOUS

## 2019-05-02 MED ORDER — MEPERIDINE HCL 25 MG/ML IJ SOLN: 6.2500 mg | INTRAMUSCULAR | Status: DC | PRN

## 2019-05-02 MED ORDER — DEXTROSE 5 % IV SOLN
3.0000 g | INTRAVENOUS | Status: AC
  Administered 2019-05-02: 2 g via INTRAVENOUS

## 2019-05-02 MED ORDER — ACETAMINOPHEN 500 MG PO TABS
1000.0000 mg | ORAL_TABLET | ORAL | Status: AC
  Administered 2019-05-02: 1000 mg via ORAL

## 2019-05-02 MED ORDER — CELECOXIB 200 MG PO CAPS
ORAL_CAPSULE | ORAL | Status: AC
  Filled 2019-05-02: qty 1

## 2019-05-02 MED ORDER — FENTANYL CITRATE (PF) 100 MCG/2ML IJ SOLN
25.0000 ug | INTRAMUSCULAR | Status: DC | PRN
  Administered 2019-05-02: 25 ug via INTRAVENOUS

## 2019-05-02 MED ORDER — BUPIVACAINE HCL (PF) 0.25 % IJ SOLN
INTRAMUSCULAR | Status: DC | PRN
  Administered 2019-05-02: 6 mL

## 2019-05-02 MED ORDER — OXYCODONE HCL 5 MG/5ML PO SOLN: 5.0000 mg | Freq: Once | ORAL | Status: AC | PRN

## 2019-05-02 MED ORDER — GABAPENTIN 300 MG PO CAPS
300.0000 mg | ORAL_CAPSULE | ORAL | Status: AC
  Administered 2019-05-02: 300 mg via ORAL

## 2019-05-02 MED ORDER — BUPIVACAINE-EPINEPHRINE (PF) 0.5% -1:200000 IJ SOLN
INTRAMUSCULAR | Status: DC | PRN
  Administered 2019-05-02: 30 mL

## 2019-05-02 MED ORDER — CEFAZOLIN SODIUM-DEXTROSE 2-4 GM/100ML-% IV SOLN
INTRAVENOUS | Status: AC
  Filled 2019-05-02: qty 100

## 2019-05-02 MED ORDER — MIDAZOLAM HCL 5 MG/5ML IJ SOLN
INTRAMUSCULAR | Status: DC | PRN
  Administered 2019-05-02: 2 mg via INTRAVENOUS

## 2019-05-02 MED ORDER — CELECOXIB 200 MG PO CAPS
200.0000 mg | ORAL_CAPSULE | ORAL | Status: AC
  Administered 2019-05-02: 200 mg via ORAL

## 2019-05-02 MED ORDER — SUGAMMADEX SODIUM 200 MG/2ML IV SOLN
INTRAVENOUS | Status: DC | PRN
  Administered 2019-05-02: 200 mg via INTRAVENOUS

## 2019-05-02 MED ORDER — EPHEDRINE SULFATE 50 MG/ML IJ SOLN
INTRAMUSCULAR | Status: DC | PRN
  Administered 2019-05-02: 25 mg via INTRAVENOUS

## 2019-05-02 MED ORDER — ONDANSETRON HCL 4 MG/2ML IJ SOLN
INTRAMUSCULAR | Status: DC | PRN
  Administered 2019-05-02: 4 mg via INTRAVENOUS

## 2019-05-02 MED ORDER — LIDOCAINE 2% (20 MG/ML) 5 ML SYRINGE
INTRAMUSCULAR | Status: DC | PRN
  Administered 2019-05-02: 50 mg via INTRAVENOUS

## 2019-05-02 MED ORDER — OXYCODONE HCL 5 MG PO TABS
ORAL_TABLET | ORAL | Status: AC
  Filled 2019-05-02: qty 1

## 2019-05-02 MED ORDER — OXYCODONE HCL 5 MG PO TABS: 5.0000 mg | ORAL_TABLET | Freq: Four times a day (QID) | ORAL | 0 refills | Status: AC | PRN

## 2019-05-02 MED ORDER — OXYCODONE HCL 5 MG PO TABS
5.0000 mg | ORAL_TABLET | Freq: Once | ORAL | Status: AC | PRN
  Administered 2019-05-02: 5 mg via ORAL

## 2019-05-02 MED ORDER — ACETAMINOPHEN 160 MG/5ML PO SOLN: 325.0000 mg | Freq: Once | ORAL | Status: DC | PRN

## 2019-05-02 MED ORDER — PHENYLEPHRINE HCL (PRESSORS) 10 MG/ML IV SOLN
INTRAVENOUS | Status: DC | PRN
  Administered 2019-05-02: 80 ug via INTRAVENOUS

## 2019-05-02 MED ORDER — PROPOFOL 10 MG/ML IV BOLUS
INTRAVENOUS | Status: AC
  Filled 2019-05-02: qty 20

## 2019-05-02 SURGICAL SUPPLY — 52 items
BLADE CLIPPER SURG (BLADE) ×1 IMPLANT
BLADE SURG 15 STRL LF DISP TIS (BLADE) ×2 IMPLANT
BLADE SURG 15 STRL SS (BLADE) ×1
CANISTER SUCT 1200ML W/VALVE (MISCELLANEOUS) IMPLANT
CHLORAPREP W/TINT 26 (MISCELLANEOUS) ×3 IMPLANT
COVER BACK TABLE 60X90IN (DRAPES) ×3 IMPLANT
COVER MAYO STAND STRL (DRAPES) ×3 IMPLANT
COVER WAND RF STERILE (DRAPES) IMPLANT
DECANTER SPIKE VIAL GLASS SM (MISCELLANEOUS) IMPLANT
DERMABOND ADVANCED (GAUZE/BANDAGES/DRESSINGS) ×1
DERMABOND ADVANCED .7 DNX12 (GAUZE/BANDAGES/DRESSINGS) ×2 IMPLANT
DRAIN PENROSE 1/2X12 LTX STRL (WOUND CARE) ×3 IMPLANT
DRAPE LAPAROTOMY TRNSV 102X78 (DRAPES) ×3 IMPLANT
DRAPE UTILITY XL STRL (DRAPES) ×3 IMPLANT
ELECT COATED BLADE 2.86 ST (ELECTRODE) ×3 IMPLANT
ELECT REM PT RETURN 9FT ADLT (ELECTROSURGICAL) ×2
ELECTRODE REM PT RTRN 9FT ADLT (ELECTROSURGICAL) ×2 IMPLANT
GAUZE 4X4 16PLY RFD (DISPOSABLE) IMPLANT
GAUZE SPONGE 4X4 12PLY STRL LF (GAUZE/BANDAGES/DRESSINGS) IMPLANT
GLOVE BIOGEL PI IND STRL 7.0 (GLOVE) IMPLANT
GLOVE BIOGEL PI IND STRL 8 (GLOVE) ×2 IMPLANT
GLOVE BIOGEL PI INDICATOR 7.0 (GLOVE) ×2
GLOVE BIOGEL PI INDICATOR 8 (GLOVE) ×1
GLOVE ECLIPSE 8.0 STRL XLNG CF (GLOVE) ×3 IMPLANT
GLOVE SURG SS PI 7.0 STRL IVOR (GLOVE) ×1 IMPLANT
GOWN STRL REUS W/ TWL LRG LVL3 (GOWN DISPOSABLE) ×4 IMPLANT
GOWN STRL REUS W/ TWL XL LVL3 (GOWN DISPOSABLE) IMPLANT
GOWN STRL REUS W/TWL LRG LVL3 (GOWN DISPOSABLE) ×1
GOWN STRL REUS W/TWL XL LVL3 (GOWN DISPOSABLE) ×1
MESH HERNIA SYS ULTRAPRO LRG (Mesh General) ×1 IMPLANT
NDL HYPO 25X1 1.5 SAFETY (NEEDLE) ×2 IMPLANT
NEEDLE HYPO 25X1 1.5 SAFETY (NEEDLE) ×2 IMPLANT
NS IRRIG 1000ML POUR BTL (IV SOLUTION) ×1 IMPLANT
PACK BASIN DAY SURGERY FS (CUSTOM PROCEDURE TRAY) ×3 IMPLANT
PENCIL SMOKE EVACUATOR (MISCELLANEOUS) ×3 IMPLANT
SLEEVE SCD COMPRESS KNEE MED (MISCELLANEOUS) ×3 IMPLANT
SPONGE LAP 4X18 RFD (DISPOSABLE) ×3 IMPLANT
STRIP CLOSURE SKIN 1/2X4 (GAUZE/BANDAGES/DRESSINGS) IMPLANT
SUT MON AB 4-0 PC3 18 (SUTURE) ×3 IMPLANT
SUT NOVA 0 T19/GS 22DT (SUTURE) IMPLANT
SUT NOVA NAB DX-16 0-1 5-0 T12 (SUTURE) ×6 IMPLANT
SUT VIC AB 2-0 SH 27 (SUTURE) ×1
SUT VIC AB 2-0 SH 27XBRD (SUTURE) ×2 IMPLANT
SUT VIC AB 3-0 54X BRD REEL (SUTURE) IMPLANT
SUT VIC AB 3-0 BRD 54 (SUTURE)
SUT VICRYL 3-0 CR8 SH (SUTURE) ×3 IMPLANT
SUT VICRYL AB 2 0 TIE (SUTURE) IMPLANT
SUT VICRYL AB 2 0 TIES (SUTURE)
SYR CONTROL 10ML LL (SYRINGE) ×3 IMPLANT
TOWEL GREEN STERILE FF (TOWEL DISPOSABLE) ×3 IMPLANT
TUBE CONNECTING 20X1/4 (TUBING) IMPLANT
YANKAUER SUCT BULB TIP NO VENT (SUCTIONS) IMPLANT

## 2019-05-02 NOTE — Anesthesia Procedure Notes (Signed)
Anesthesia Regional Block: TAP block   Pre-Anesthetic Checklist: ,, timeout performed, Correct Patient, Correct Site, Correct Laterality, Correct Procedure, Correct Position, site marked, Risks and benefits discussed,  Surgical consent,  Pre-op evaluation,  At surgeon's request and post-op pain management  Laterality: Left  Prep: chloraprep       Needles:  Injection technique: Single-shot  Needle Type: Echogenic Stimulator Needle     Needle Length: 9cm  Needle Gauge: 21     Additional Needles:   Procedures:,,,, ultrasound used (permanent image in chart),,,,  Narrative:  Start time: 05/02/2019 9:55 AM End time: 05/02/2019 10:00 AM Injection made incrementally with aspirations every 5 mL.  Performed by: Personally  Anesthesiologist: Effie Berkshire, MD  Additional Notes: Patient tolerated the procedure well. Local anesthetic introduced in an incremental fashion under minimal resistance after negative aspirations. No paresthesias were elicited. After completion of the procedure, no acute issues were identified and patient continued to be monitored by RN.

## 2019-05-02 NOTE — Op Note (Signed)
Left inguinal hernia repair with mesh, Open, Procedure Note  Indications: The patient presented with a history of a left, reducible inguinal  hernia.  Discussed options of laparoscopic versus open repair.  Discussed chronic pain with the use of mesh.  Discussed complications, and long-term expectations as well as recovery.The risk of hernia repair include bleeding,  Infection,   Recurrence of the hernia,  Mesh use, chronic pain,  Organ injury,  Bowel injury,  Bladder injury,   nerve injury with numbness around the incision,  Death,  and worsening of preexisting  medical problems.  The alternatives to surgery have been discussed as well..  Long term expectations of both operative and non operative treatments have been discussed.   The patient agrees to proceed.     Pre-operative Diagnosis: left reducible inguinal hernia  Post-operative Diagnosis: same (direct )  Surgeon: Turner Daniels MD  Assistants: None  Anesthesia: General endotracheal anesthesia and TA P block  ASA Class: 2  Procedure Details  The patient was seen again in the Holding Room. The risks, benefits, complications, treatment options, and expected outcomes were discussed with the patient. The possibilities of reaction to medication, pulmonary aspiration, perforation of viscus, bleeding, recurrent infection, the need for additional procedures, and development of a complication requiring transfusion or further operation were discussed with the patient and/or family. There was concurrence with the proposed plan, and informed consent was obtained. The site of surgery was properly noted/marked. The patient was taken to the Operating Room, identified as Nathaniel [name], and the procedure verified as hernia repair. A Time Out was held and the above information confirmed.  The patient was placed in the supine position and underwent induction of anesthesia, the lower abdomen and groin was prepped and draped in the standard fashion, and  0.25% Marcaine with epinephrine was used to anesthetize the skin over the mid-portion of the inguinal canal. A transverse incision was made. Dissection was carried through the soft tissue to expose the inguinal canal and inguinal ligament along its lower edge. The external oblique fascia was split along the course of its fibers, exposing the inguinal canal. The cord and nerve were looped using a Penrose drain and reflected out of the field. The direct defect was exposed and a piece of prolene hernia system ultrapro mesh was placed into the defect.  The inner leaflet was deployed into the preperitoneal space.  The onlay was placed onto the floor of the inguinal canal and a slit was cut to allow the cord through the mesh.  Interupted 1-0 novafil suture was then used  to repair the defect, with the suture being sewn from the pubic tubercle inferiorly and superiorly along the canal to a level just beyond the internal ring. The mesh was split to allow passage of the cord and nerve into the canal without entrapment.  The ilioinguinal nerve was tethered as it came to the mesh.  I divided the nerve to prevent entrapment and scarring causing chronic pain as it exited the muscle.  The contents were then returned to canal and the external oblique fashion was then closed in a continuous fashion using 3-0 Vicryl suture taking care not to cause entrapment. Scarpa's layer closed with 3 0 vicryl and 4 0 monocryl used to close the skin.  Dermabond used for dressing.  Instrument, sponge, and needle counts were correct prior to closure and at the conclusion of the case.  Findings: Hernia as above  Estimated Blood Loss: less than 50 mL  Drains: None         Total IV Fluids: Per anesthesia record         Specimens: None               Complications: None; patient tolerated the procedure well.         Disposition: PACU - hemodynamically stable.         Condition: stable

## 2019-05-02 NOTE — Anesthesia Preprocedure Evaluation (Addendum)
Anesthesia Evaluation  Patient identified by MRN, date of birth, ID band Patient awake    Reviewed: Allergy & Precautions, NPO status , Patient's Chart, lab work & pertinent test results, reviewed documented beta blocker date and time   History of Anesthesia Complications (+) PONV and history of anesthetic complications  Airway Mallampati: II  TM Distance: >3 FB Neck ROM: Full    Dental  (+) Teeth Intact, Dental Advisory Given   Pulmonary sleep apnea , former smoker,    breath sounds clear to auscultation       Cardiovascular + CAD, + Past MI and + Cardiac Stents   Rhythm:Regular Rate:Normal     Neuro/Psych  Headaches, PSYCHIATRIC DISORDERS Depression    GI/Hepatic Neg liver ROS, GERD  Medicated,  Endo/Other  diabetes, Type 2, Insulin Dependent  Renal/GU negative Renal ROS     Musculoskeletal negative musculoskeletal ROS (+)   Abdominal Normal abdominal exam  (+)   Peds  Hematology negative hematology ROS (+)   Anesthesia Other Findings   Reproductive/Obstetrics                            Anesthesia Physical Anesthesia Plan  ASA: II  Anesthesia Plan: General   Post-op Pain Management: GA combined w/ Regional for post-op pain   Induction: Intravenous  PONV Risk Score and Plan: 4 or greater and Ondansetron, Dexamethasone, Midazolam and Scopolamine patch - Pre-op  Airway Management Planned: Oral ETT  Additional Equipment: None  Intra-op Plan:   Post-operative Plan: Extubation in OR  Informed Consent: I have reviewed the patients History and Physical, chart, labs and discussed the procedure including the risks, benefits and alternatives for the proposed anesthesia with the patient or authorized representative who has indicated his/her understanding and acceptance.     Dental advisory given  Plan Discussed with: CRNA  Anesthesia Plan Comments:        Anesthesia Quick  Evaluation

## 2019-05-02 NOTE — Discharge Instructions (Signed)
[facility] _____[facility]  UMBILICAL OR INGUINAL HERNIA REPAIR: POST OP INSTRUCTIONS  Always review your discharge instruction sheet given to you by the facility where your surgery was performed. IF YOU HAVE DISABILITY OR FAMILY LEAVE FORMS, YOU MUST BRING THEM TO THE OFFICE FOR PROCESSING.   DO NOT GIVE THEM TO YOUR DOCTOR.  1. A  prescription for pain medication may be given to you upon discharge.  Take your pain medication as prescribed, if needed.  If narcotic pain medicine is not needed, then you may take acetaminophen (Tylenol) or ibuprofen (Advil) as needed. 2. Take your usually prescribed medications unless otherwise directed. If you need a refill on your pain medication, please contact your pharmacy.  They will contact our office to request authorization. Prescriptions will not be filled after 5 pm or on week-ends. 3. You should follow a light diet the first 24 hours after arrival home, such as soup and crackers, etc.  Be sure to include lots of fluids daily.  Resume your normal diet the day after surgery. 4.Most patients will experience some swelling and bruising around the umbilicus or in the groin and scrotum.  Ice packs and reclining will help.  Swelling and bruising can take several days to resolve.  6. It is common to experience some constipation if taking pain medication after surgery.  Increasing fluid intake and taking a stool softener (such as Colace) will usually help or prevent this problem from occurring.  A mild laxative (Milk of Magnesia or Miralax) should be taken according to package directions if there are no bowel movements after 48 hours. 7. Unless discharge instructions indicate otherwise, you may remove your bandages 24-48 hours after surgery, and you may shower at that time.  You may have steri-strips (small skin tapes) in place directly over the incision.  These strips should be left on the skin for 7-10 days.  If your surgeon used skin glue on the  incision, you may shower in 24 hours.  The glue will flake off over the next 2-3 weeks.  Any sutures or staples will be removed at the office during your follow-up visit. 8. ACTIVITIES:  You may resume regular (light) daily activities beginning the next day--such as daily self-care, walking, climbing stairs--gradually increasing activities as tolerated.  You may have sexual intercourse when it is comfortable.  Refrain from any heavy lifting or straining until approved by your doctor.  a.You may drive when you are no longer taking prescription pain medication, you can comfortably wear a seatbelt, and you can safely maneuver your car and apply brakes. b.RETURN TO WORK:   _____________________________________________  9.You should see your doctor in the office for a follow-up appointment approximately 2-3 weeks after your surgery.  Make sure that you call for this appointment within a day or two after you arrive home to insure a convenient appointment time. 10.OTHER INSTRUCTIONS: _________________________    _____________________________________  WHEN TO CALL YOUR DOCTOR: 1. Fever over 101.0 2. Inability to urinate 3. Nausea and/or vomiting 4. Extreme swelling or bruising 5. Continued bleeding from incision. 6. Increased pain, redness, or drainage from the incision  The clinic staff is available to answer your questions during regular business hours.  Please don't hesitate to call and ask to speak to one of the nurses for clinical concerns.  If you have a medical emergency, go to the nearest emergency room or call 911.  A surgeon from [facility] is always on call at the hospital  NO  TYLENOL PRODUCTS UNTIL  3:00 PM  NO ADVIL/ [employer] UNTIL  5:00PM     [address] [place], [place], [place]  [address] ?  [address], [place], [place]   [address] [phone] ? [phone] ? FAX [phone] Web site: [email or web address]    Post Anesthesia Home Care  Instructions  Activity: Get plenty of rest for the remainder of the day. A responsible individual must stay with you for 24 hours following the procedure.  For the next 24 hours, DO NOT: -Drive a car -Paediatric nurse -Drink alcoholic beverages -Take any medication unless instructed by your physician -Make any legal decisions or sign important papers.  Meals: Start with liquid foods such as gelatin or soup. Progress to regular foods as tolerated. Avoid greasy, spicy, heavy foods. If nausea and/or vomiting occur, drink only clear liquids until the nausea and/or vomiting subsides. Call your physician if vomiting continues.  Special Instructions/Symptoms: Your throat may feel dry or sore from the anesthesia or the breathing tube placed in your throat during surgery. If this causes discomfort, gargle with warm salt water. The discomfort should disappear within 24 hours.  If you had a scopolamine patch placed behind your ear for the management of post- operative nausea and/or vomiting:  1. The medication in the patch is effective for 72 hours, after which it should be removed.  Wrap patch in a tissue and discard in the trash. Wash hands thoroughly with soap and water. 2. You may remove the patch earlier than 72 hours if you experience unpleasant side effects which may include dry mouth, dizziness or visual disturbances. 3. Avoid touching the patch. Wash your hands with soap and water after contact with the patch.    May take oxycodone after 7pm

## 2019-05-02 NOTE — Anesthesia Postprocedure Evaluation (Signed)
Anesthesia Post Note  Patient: Nathaniel [name]  Procedure(s) Performed: LEFT HERNIA REPAIR INGUINAL ADULT (Left Groin) INSERTION OF MESH (Left Groin)     Patient location during evaluation: PACU Anesthesia Type: General Level of consciousness: awake and alert Pain management: pain level controlled Vital Signs Assessment: post-procedure vital signs reviewed and stable Respiratory status: spontaneous breathing, nonlabored ventilation, respiratory function stable and patient connected to nasal cannula oxygen Cardiovascular status: blood pressure returned to baseline and stable Postop Assessment: no apparent nausea or vomiting Anesthetic complications: no    Last Vitals:  Vitals:   05/02/19 1245 05/02/19 1321  BP:  (!) 146/95  Pulse: 72 65  Resp:  18  Temp:  36.6 C  SpO2: 97% 100%    Last Pain:  Vitals:   05/02/19 1321  TempSrc: Oral  PainSc: 2                  Effie Berkshire

## 2019-05-02 NOTE — Interval H&P Note (Signed)
History and Physical Interval Note:  05/02/2019 9:24 AM  Nathaniel [name]  has presented today for surgery, with the diagnosis of Left inguinal hernia.  The various methods of treatment have been discussed with the patient and family. After consideration of risks, benefits and other options for treatment, the patient has consented to  Procedure(s): LEFT HERNIA REPAIR INGUINAL ADULT (Left) INSERTION OF MESH (Left) as a surgical intervention.  The patient's history has been reviewed, patient examined, no change in status, stable for surgery.  I have reviewed the patient's chart and labs.  Questions were answered to the patient's satisfaction.     [place]

## 2019-05-02 NOTE — Anesthesia Procedure Notes (Addendum)
Procedure Name: Intubation Date/Time: 05/02/2019 10:11 AM Performed by: Effie Berkshire, MD Pre-anesthesia Checklist: Patient identified, Emergency Drugs available, Suction available, Patient being monitored and Timeout performed Patient Re-evaluated:Patient Re-evaluated prior to induction Oxygen Delivery Method: Circle system utilized Preoxygenation: Pre-oxygenation with 100% oxygen Induction Type: IV induction Ventilation: Mask ventilation without difficulty Laryngoscope Size: Mac and 4 Grade View: Grade III Tube type: Oral Tube size: 8.0 mm Number of attempts: 1 Airway Equipment and Method: Stylet and Oral airway Placement Confirmation: ETT inserted through vocal cords under direct vision,  positive ETCO2 and breath sounds checked- equal and bilateral Secured at: 25 cm Tube secured with: Tape Dental Injury: Teeth and Oropharynx as per pre-operative assessment  Difficulty Due To: Difficulty was anticipated, Difficult Airway- due to reduced neck mobility and Difficult Airway- due to dentition

## 2019-05-02 NOTE — Transfer of Care (Signed)
Immediate Anesthesia Transfer of Care Note  Patient: Nathaniel [name]  Procedure(s) Performed: LEFT HERNIA REPAIR INGUINAL ADULT (Left Groin) INSERTION OF MESH (Left Groin)  Patient Location: PACU  Anesthesia Type:General  Level of Consciousness: awake and patient cooperative  Airway & Oxygen Therapy: Patient Spontanous Breathing and Patient connected to face mask oxygen  Post-op Assessment: Report given to RN and Post -op Vital signs reviewed and stable  Post vital signs: Reviewed and stable  Last Vitals:  Vitals Value Taken Time  BP    Temp    Pulse 80 05/02/19 1131  Resp    SpO2 98 % 05/02/19 1131  Vitals shown include unvalidated device data.  Last Pain:  Vitals:   05/02/19 0846  TempSrc: Temporal  PainSc: 0-No pain      Patients Stated Pain Goal: 1 ([id number])  Complications: No apparent anesthesia complications

## 2019-05-02 NOTE — Progress Notes (Signed)
Assisted Dr. Smith Robert with left, ultrasound guided, transabdominal plane block. Side rails up, monitors on throughout procedure. See vital signs in flow sheet. Tolerated Procedure well.

## 2019-05-03 ENCOUNTER — Other Ambulatory Visit: Payer: Self-pay | Admitting: Cardiovascular Disease

## 2019-05-03 ENCOUNTER — Other Ambulatory Visit: Payer: Self-pay | Admitting: Internal Medicine

## 2019-05-03 ENCOUNTER — Encounter: Payer: Self-pay | Admitting: *Deleted

## 2019-05-04 ENCOUNTER — Encounter: Payer: Self-pay | Admitting: Internal Medicine

## 2019-05-07 ENCOUNTER — Encounter: Payer: Self-pay | Admitting: Internal Medicine

## 2019-08-02 ENCOUNTER — Telehealth: Payer: Self-pay | Admitting: *Deleted

## 2019-08-02 NOTE — Telephone Encounter (Signed)
   [facility] Pre-operative Risk Assessment    HEARTCARE STAFF: - Please ensure there is not already an duplicate clearance open for this procedure. - Under Visit Info/Reason for Call, type in Other and utilize the format Clearance MM/DD/YY or Clearance TBD. Do not use dashes or single digits. - If request is for dental extraction, please clarify the # of teeth to be extracted.  Request for surgical clearance:  1. What type of surgery is being performed? INSERTION OF A THREE PIECE PENILE IMPLANT   2. When is this surgery scheduled? 09/11/19   3. What type of clearance is required (medical clearance vs. Pharmacy clearance to hold med vs. Both)? MEDICAL  4. Are there any medications that need to be held prior to surgery and how long? ASA    5. Practice name and name of physician performing surgery? [facility]; SURGEON IS NOT LISTED (TRIED TO REACH REQUESTING OFFICE FOR NAME OF SURGEON, THOUGH THE OFFICE WAS ALREADY CLOSED)   6. What is the office phone number? [phone]   7.   What is the office fax number? [phone]  8.   Anesthesia type (None, local, MAC, general) ? NEED TYPE OF ANESTHESIA; OFFICE ALREADY CLOSED    Julaine Hua 08/02/2019, 4:52 PM  _________________________________________________________________   (provider comments below)

## 2019-08-02 NOTE — Telephone Encounter (Signed)
OK to hold ASA. Nathaniel [name]  

## 2019-08-02 NOTE — Telephone Encounter (Signed)
Left message for the patient to call back and speak to the preop APP. Overall, low risk procedure. Dr. Angelena Form, as long as patient has not chest pain and able to accomplish 4 METS of activity, would you be ok with holding aspirin prior to the upcoming procedure?  Please forward your response to P [facility] PREOP

## 2019-08-03 NOTE — Telephone Encounter (Signed)
Left voicemail for the patient to callback and speak to the on call preop APP

## 2019-08-03 NOTE — Telephone Encounter (Signed)
Follow up  Pt is returning call  Call transferred to [facility]

## 2019-08-03 NOTE — Telephone Encounter (Signed)
   Primary Cardiologist: Lauree Chandler, MD  Chart reviewed as part of pre-operative protocol coverage. Patient was contacted 08/03/2019 in reference to pre-operative risk assessment for pending surgery as outlined below.  Nathaniel [name] was last seen on 02/15/2019 by Dr. Angelena Form.  Since that day, Nathaniel [name] has done well without chest pain or shortness of breath.  Therefore, based on ACC/AHA guidelines, the patient would be at acceptable risk for the planned procedure without further cardiovascular testing.   I will route this recommendation to the requesting party via Epic fax function and remove from pre-op pool. Please call with questions.  He may hold aspirin for 7 days prior to the surgery and restart as soon as possible after the procedure at the surgeon's discretion.   [place], [place] 08/03/2019, 12:44 PM

## 2020-02-07 ENCOUNTER — Other Ambulatory Visit: Payer: Self-pay | Admitting: Cardiovascular Disease

## 2020-02-14 ENCOUNTER — Other Ambulatory Visit: Payer: Self-pay | Admitting: Cardiovascular Disease

## 2020-03-11 ENCOUNTER — Telehealth: Payer: Self-pay | Admitting: Gastroenterology

## 2020-03-11 NOTE — Telephone Encounter (Signed)
Pt is wondering when he is due for another colonoscopy since his last one was 2016 and it said to return in 5 years but pt has a recall for a colonoscopy 2026.

## 2020-03-11 NOTE — Telephone Encounter (Signed)
The pt is due for an office visit follow up.  His colon recall is for 2026.  Left message on machine to call back

## 2020-03-12 NOTE — Telephone Encounter (Signed)
The pt has been advised that he is due for colon in 2026. He was also advised that he due for follow up. However he has moved out to town and will be requesting records.

## 2020-03-12 NOTE — Telephone Encounter (Signed)
Left message on machine to call back  

## 2020-03-29 ENCOUNTER — Other Ambulatory Visit: Payer: Self-pay | Admitting: Cardiovascular Disease

## 2020-05-05 ENCOUNTER — Other Ambulatory Visit: Payer: Self-pay | Admitting: Cardiovascular Disease

## 2020-05-17 ENCOUNTER — Other Ambulatory Visit: Payer: Self-pay | Admitting: Cardiovascular Disease

## 2020-06-12 ENCOUNTER — Other Ambulatory Visit: Payer: Self-pay | Admitting: Cardiovascular Disease

## 2020-07-19 ENCOUNTER — Other Ambulatory Visit: Payer: Self-pay | Admitting: Internal Medicine

## 2024-01-13 ENCOUNTER — Encounter (HOSPITAL_BASED_OUTPATIENT_CLINIC_OR_DEPARTMENT_OTHER): Payer: Self-pay | Admitting: Surgery
